# Patient Record
Sex: Female | Born: 1935 | Race: White | Hispanic: No | Smoking: Never smoker
Health system: Southern US, Community
[De-identification: ages and names within clinical notes are randomized; demographics above are authoritative.]

## PROBLEM LIST (undated history)

## (undated) DIAGNOSIS — K589 Irritable bowel syndrome without diarrhea: Secondary | ICD-10-CM

## (undated) DIAGNOSIS — I10 Essential (primary) hypertension: Secondary | ICD-10-CM

## (undated) DIAGNOSIS — N183 Chronic kidney disease, stage 3 unspecified: Secondary | ICD-10-CM

## (undated) DIAGNOSIS — M1990 Unspecified osteoarthritis, unspecified site: Secondary | ICD-10-CM

## (undated) DIAGNOSIS — E785 Hyperlipidemia, unspecified: Secondary | ICD-10-CM

## (undated) DIAGNOSIS — J449 Chronic obstructive pulmonary disease, unspecified: Secondary | ICD-10-CM

## (undated) DIAGNOSIS — G43909 Migraine, unspecified, not intractable, without status migrainosus: Secondary | ICD-10-CM

## (undated) DIAGNOSIS — C8590 Non-Hodgkin lymphoma, unspecified, unspecified site: Secondary | ICD-10-CM

## (undated) HISTORY — PX: APPENDECTOMY: SHX54

## (undated) HISTORY — PX: ABDOMINAL HYSTERECTOMY: SHX81

## (undated) HISTORY — PX: COLONOSCOPY: SHX174

---

## 2004-09-21 ENCOUNTER — Ambulatory Visit: Payer: Self-pay | Admitting: Oncology

## 2004-10-22 ENCOUNTER — Ambulatory Visit: Payer: Self-pay | Admitting: Oncology

## 2005-01-09 ENCOUNTER — Ambulatory Visit: Payer: Self-pay | Admitting: Oncology

## 2005-01-22 ENCOUNTER — Ambulatory Visit: Payer: Self-pay | Admitting: Oncology

## 2005-03-31 ENCOUNTER — Ambulatory Visit: Payer: Self-pay | Admitting: Oncology

## 2005-04-03 ENCOUNTER — Ambulatory Visit: Payer: Self-pay | Admitting: Internal Medicine

## 2005-04-03 ENCOUNTER — Ambulatory Visit: Payer: Self-pay | Admitting: Oncology

## 2005-04-21 ENCOUNTER — Ambulatory Visit: Payer: Self-pay | Admitting: Oncology

## 2005-07-16 ENCOUNTER — Ambulatory Visit: Payer: Self-pay | Admitting: Oncology

## 2005-07-22 ENCOUNTER — Ambulatory Visit: Payer: Self-pay | Admitting: Oncology

## 2005-10-14 ENCOUNTER — Ambulatory Visit: Payer: Self-pay | Admitting: Oncology

## 2005-10-22 ENCOUNTER — Ambulatory Visit: Payer: Self-pay | Admitting: Oncology

## 2005-11-07 ENCOUNTER — Ambulatory Visit: Payer: Self-pay | Admitting: Oncology

## 2006-02-17 ENCOUNTER — Ambulatory Visit: Payer: Self-pay | Admitting: Oncology

## 2006-02-19 ENCOUNTER — Ambulatory Visit: Payer: Self-pay | Admitting: Oncology

## 2006-05-14 ENCOUNTER — Ambulatory Visit: Payer: Self-pay | Admitting: Oncology

## 2006-05-22 ENCOUNTER — Ambulatory Visit: Payer: Self-pay | Admitting: Oncology

## 2006-06-09 ENCOUNTER — Ambulatory Visit: Payer: Self-pay | Admitting: Internal Medicine

## 2006-08-26 ENCOUNTER — Ambulatory Visit: Payer: Self-pay | Admitting: Oncology

## 2006-09-21 ENCOUNTER — Ambulatory Visit: Payer: Self-pay | Admitting: Oncology

## 2006-11-13 ENCOUNTER — Ambulatory Visit: Payer: Self-pay | Admitting: Oncology

## 2006-11-19 ENCOUNTER — Ambulatory Visit: Payer: Self-pay | Admitting: Oncology

## 2007-02-15 ENCOUNTER — Ambulatory Visit: Payer: Self-pay | Admitting: Oncology

## 2007-02-20 ENCOUNTER — Ambulatory Visit: Payer: Self-pay | Admitting: Oncology

## 2007-06-14 ENCOUNTER — Ambulatory Visit: Payer: Self-pay | Admitting: Internal Medicine

## 2007-07-23 ENCOUNTER — Ambulatory Visit: Payer: Self-pay | Admitting: Oncology

## 2007-08-19 ENCOUNTER — Ambulatory Visit: Payer: Self-pay | Admitting: Oncology

## 2007-08-23 ENCOUNTER — Ambulatory Visit: Payer: Self-pay | Admitting: Oncology

## 2008-01-23 ENCOUNTER — Ambulatory Visit: Payer: Self-pay | Admitting: Oncology

## 2008-02-20 ENCOUNTER — Ambulatory Visit: Payer: Self-pay | Admitting: Oncology

## 2008-03-02 ENCOUNTER — Ambulatory Visit: Payer: Self-pay | Admitting: Oncology

## 2008-03-22 ENCOUNTER — Ambulatory Visit: Payer: Self-pay | Admitting: Oncology

## 2008-06-14 ENCOUNTER — Ambulatory Visit: Payer: Self-pay | Admitting: Internal Medicine

## 2008-08-22 ENCOUNTER — Ambulatory Visit: Payer: Self-pay | Admitting: Oncology

## 2008-09-07 ENCOUNTER — Ambulatory Visit: Payer: Self-pay | Admitting: Oncology

## 2008-09-21 ENCOUNTER — Ambulatory Visit: Payer: Self-pay | Admitting: Oncology

## 2009-02-19 ENCOUNTER — Ambulatory Visit: Payer: Self-pay | Admitting: Oncology

## 2009-03-15 ENCOUNTER — Ambulatory Visit: Payer: Self-pay | Admitting: Oncology

## 2009-03-22 ENCOUNTER — Ambulatory Visit: Payer: Self-pay | Admitting: Oncology

## 2009-06-18 ENCOUNTER — Ambulatory Visit: Payer: Self-pay | Admitting: Internal Medicine

## 2009-09-21 ENCOUNTER — Ambulatory Visit: Payer: Self-pay | Admitting: Oncology

## 2009-09-26 ENCOUNTER — Ambulatory Visit: Payer: Self-pay | Admitting: Oncology

## 2009-10-22 ENCOUNTER — Ambulatory Visit: Payer: Self-pay | Admitting: Oncology

## 2010-02-14 ENCOUNTER — Emergency Department: Payer: Self-pay | Admitting: Emergency Medicine

## 2010-02-19 ENCOUNTER — Ambulatory Visit: Payer: Self-pay | Admitting: Oncology

## 2010-02-26 ENCOUNTER — Ambulatory Visit: Payer: Self-pay | Admitting: Internal Medicine

## 2010-03-13 ENCOUNTER — Ambulatory Visit: Payer: Self-pay | Admitting: Oncology

## 2010-03-22 ENCOUNTER — Ambulatory Visit: Payer: Self-pay | Admitting: Oncology

## 2010-07-12 ENCOUNTER — Ambulatory Visit: Payer: Self-pay | Admitting: Internal Medicine

## 2011-02-27 ENCOUNTER — Ambulatory Visit: Payer: Self-pay | Admitting: Oncology

## 2011-02-27 ENCOUNTER — Other Ambulatory Visit: Payer: Self-pay | Admitting: Ophthalmology

## 2011-03-07 ENCOUNTER — Ambulatory Visit: Payer: Self-pay | Admitting: Ophthalmology

## 2011-03-23 ENCOUNTER — Ambulatory Visit: Payer: Self-pay | Admitting: Oncology

## 2011-05-06 ENCOUNTER — Ambulatory Visit: Payer: Self-pay | Admitting: Ophthalmology

## 2011-05-30 ENCOUNTER — Ambulatory Visit: Payer: Self-pay | Admitting: Oncology

## 2011-06-22 ENCOUNTER — Ambulatory Visit: Payer: Self-pay | Admitting: Oncology

## 2011-07-14 ENCOUNTER — Ambulatory Visit: Payer: Self-pay | Admitting: Internal Medicine

## 2011-09-09 ENCOUNTER — Ambulatory Visit: Payer: Self-pay | Admitting: Oncology

## 2011-09-22 ENCOUNTER — Ambulatory Visit: Payer: Self-pay | Admitting: Oncology

## 2011-10-09 ENCOUNTER — Ambulatory Visit: Payer: Self-pay | Admitting: Oncology

## 2011-10-23 ENCOUNTER — Ambulatory Visit: Payer: Self-pay | Admitting: Oncology

## 2012-04-08 ENCOUNTER — Ambulatory Visit: Payer: Self-pay | Admitting: Oncology

## 2012-04-08 LAB — COMPREHENSIVE METABOLIC PANEL
Albumin: 3.8 g/dL (ref 3.4–5.0)
Anion Gap: 9 (ref 7–16)
BUN: 18 mg/dL (ref 7–18)
Bilirubin,Total: 0.5 mg/dL (ref 0.2–1.0)
Calcium, Total: 9.1 mg/dL (ref 8.5–10.1)
Chloride: 97 mmol/L — ABNORMAL LOW (ref 98–107)
Creatinine: 0.94 mg/dL (ref 0.60–1.30)
EGFR (African American): >60
EGFR (Non-African Amer.): 59 — ABNORMAL LOW
Glucose: 108 mg/dL — ABNORMAL HIGH (ref 65–99)
Potassium: 4.3 mmol/L (ref 3.5–5.1)
SGPT (ALT): 22 U/L
Total Protein: 6.9 g/dL (ref 6.4–8.2)

## 2012-04-08 LAB — CBC CANCER CENTER
Basophil #: 0.1 x10 3/mm (ref 0.0–0.1)
Basophil %: 1.1 %
Eosinophil %: 1.7 %
HGB: 13.7 g/dL (ref 12.0–16.0)
Lymphocyte #: 3.2 x10 3/mm (ref 1.0–3.6)
MCH: 31.5 pg (ref 26.0–34.0)
MCHC: 33.6 g/dL (ref 32.0–36.0)
Monocyte #: 0.5 x10 3/mm (ref 0.2–0.9)
Monocyte %: 7.1 %
Neutrophil #: 3.6 x10 3/mm (ref 1.4–6.5)
Neutrophil %: 48 %
Platelet: 281 x10 3/mm (ref 150–440)
WBC: 7.6 x10 3/mm (ref 3.6–11.0)

## 2012-04-08 LAB — LACTATE DEHYDROGENASE: LDH: 212 U/L (ref 84–246)

## 2012-04-21 ENCOUNTER — Ambulatory Visit: Payer: Self-pay | Admitting: Oncology

## 2012-05-26 ENCOUNTER — Ambulatory Visit: Payer: Self-pay | Admitting: Gastroenterology

## 2012-07-15 ENCOUNTER — Ambulatory Visit: Payer: Self-pay | Admitting: Internal Medicine

## 2012-10-12 ENCOUNTER — Ambulatory Visit: Payer: Self-pay | Admitting: Oncology

## 2012-10-12 LAB — CBC CANCER CENTER
Basophil #: 0.1 x10 3/mm (ref 0.0–0.1)
Basophil %: 1 %
Eosinophil #: 0.1 x10 3/mm (ref 0.0–0.7)
Eosinophil %: 1.4 %
HCT: 42.5 % (ref 35.0–47.0)
HGB: 14.4 g/dL (ref 12.0–16.0)
Lymphocyte #: 3.9 x10 3/mm — ABNORMAL HIGH (ref 1.0–3.6)
Lymphocyte %: 40.8 %
MCH: 31.7 pg (ref 26.0–34.0)
MCHC: 33.8 g/dL (ref 32.0–36.0)
Monocyte #: 0.7 x10 3/mm (ref 0.2–0.9)
Monocyte %: 7 %
Neutrophil #: 4.8 x10 3/mm (ref 1.4–6.5)
Neutrophil %: 49.8 %
RBC: 4.53 10*6/uL (ref 3.80–5.20)
RDW: 12.4 % (ref 11.5–14.5)
WBC: 9.7 x10 3/mm (ref 3.6–11.0)

## 2012-10-12 LAB — COMPREHENSIVE METABOLIC PANEL
Albumin: 4 g/dL (ref 3.4–5.0)
Alkaline Phosphatase: 54 U/L (ref 50–136)
Calcium, Total: 9.3 mg/dL (ref 8.5–10.1)
Co2: 28 mmol/L (ref 21–32)
Creatinine: 1.05 mg/dL (ref 0.60–1.30)
EGFR (African American): 59 — ABNORMAL LOW
EGFR (Non-African Amer.): 51 — ABNORMAL LOW
Glucose: 113 mg/dL — ABNORMAL HIGH (ref 65–99)
Potassium: 3.9 mmol/L (ref 3.5–5.1)
SGPT (ALT): 24 U/L (ref 12–78)

## 2012-10-22 ENCOUNTER — Ambulatory Visit: Payer: Self-pay | Admitting: Oncology

## 2013-04-12 ENCOUNTER — Ambulatory Visit: Payer: Self-pay | Admitting: Oncology

## 2013-04-12 LAB — COMPREHENSIVE METABOLIC PANEL
Albumin: 3.8 g/dL (ref 3.4–5.0)
Alkaline Phosphatase: 53 U/L (ref 50–136)
BUN: 17 mg/dL (ref 7–18)
Calcium, Total: 9.6 mg/dL (ref 8.5–10.1)
Chloride: 97 mmol/L — ABNORMAL LOW (ref 98–107)
Co2: 30 mmol/L (ref 21–32)
Creatinine: 1.1 mg/dL (ref 0.60–1.30)
EGFR (African American): 56 — ABNORMAL LOW
EGFR (Non-African Amer.): 48 — ABNORMAL LOW
Glucose: 143 mg/dL — ABNORMAL HIGH (ref 65–99)
Osmolality: 278 (ref 275–301)
Potassium: 4.4 mmol/L (ref 3.5–5.1)
SGOT(AST): 22 U/L (ref 15–37)
SGPT (ALT): 28 U/L (ref 12–78)
Sodium: 137 mmol/L (ref 136–145)

## 2013-04-12 LAB — CBC CANCER CENTER
Basophil #: 0 x10 3/mm (ref 0.0–0.1)
Basophil %: 0.3 %
HGB: 14.3 g/dL (ref 12.0–16.0)
Lymphocyte #: 2.5 x10 3/mm (ref 1.0–3.6)
MCH: 31.6 pg (ref 26.0–34.0)
MCHC: 34.4 g/dL (ref 32.0–36.0)
Monocyte %: 8.3 %
Platelet: 263 x10 3/mm (ref 150–440)
RBC: 4.52 10*6/uL (ref 3.80–5.20)
RDW: 12.3 % (ref 11.5–14.5)

## 2013-04-21 ENCOUNTER — Ambulatory Visit: Payer: Self-pay | Admitting: Oncology

## 2013-07-18 ENCOUNTER — Ambulatory Visit: Payer: Self-pay | Admitting: Oncology

## 2013-08-04 ENCOUNTER — Ambulatory Visit: Payer: Self-pay | Admitting: Oncology

## 2013-08-22 ENCOUNTER — Ambulatory Visit: Payer: Self-pay | Admitting: Oncology

## 2013-08-25 ENCOUNTER — Emergency Department: Payer: Self-pay | Admitting: Emergency Medicine

## 2013-08-25 LAB — URINALYSIS, COMPLETE
RBC,UR: 2593 /HPF (ref 0–5)
Squamous Epithelial: NONE SEEN
WBC UR: 512 /HPF (ref 0–5)

## 2013-08-26 ENCOUNTER — Ambulatory Visit: Payer: Self-pay | Admitting: Oncology

## 2013-09-05 ENCOUNTER — Inpatient Hospital Stay: Payer: Self-pay | Admitting: Internal Medicine

## 2013-09-05 LAB — COMPREHENSIVE METABOLIC PANEL
Anion Gap: 10 (ref 7–16)
BUN: 10 mg/dL (ref 7–18)
Bilirubin,Total: 1.1 mg/dL — ABNORMAL HIGH (ref 0.2–1.0)
Chloride: 80 mmol/L — ABNORMAL LOW (ref 98–107)
Co2: 25 mmol/L (ref 21–32)
Creatinine: 1.02 mg/dL (ref 0.60–1.30)
Glucose: 158 mg/dL — ABNORMAL HIGH (ref 65–99)
Osmolality: 235 (ref 275–301)
Potassium: 3.4 mmol/L — ABNORMAL LOW (ref 3.5–5.1)
SGOT(AST): 33 U/L (ref 15–37)
SGPT (ALT): 21 U/L (ref 12–78)
Sodium: 115 mmol/L — CL (ref 136–145)

## 2013-09-05 LAB — URINALYSIS, COMPLETE
Bacteria: NONE SEEN
Bilirubin,UR: NEGATIVE
Hyaline Cast: 2
Protein: NEGATIVE
RBC,UR: 2 /HPF (ref 0–5)
Specific Gravity: 1.005 (ref 1.003–1.030)
Squamous Epithelial: NONE SEEN

## 2013-09-05 LAB — CBC WITH DIFFERENTIAL/PLATELET
Basophil #: 0 10*3/uL (ref 0.0–0.1)
Lymphocyte #: 2.6 10*3/uL (ref 1.0–3.6)
MCV: 90 fL (ref 80–100)
Monocyte #: 0.6 x10 3/mm (ref 0.2–0.9)
Monocyte %: 6.6 %
Neutrophil #: 5.4 10*3/uL (ref 1.4–6.5)
Neutrophil %: 62.9 %
Platelet: 330 10*3/uL (ref 150–440)
RBC: 4.22 10*6/uL (ref 3.80–5.20)
WBC: 8.6 10*3/uL (ref 3.6–11.0)

## 2013-09-05 LAB — POTASSIUM: Potassium: 3.9 mmol/L (ref 3.5–5.1)

## 2013-09-05 LAB — SODIUM: Sodium: 122 mmol/L — ABNORMAL LOW (ref 136–145)

## 2013-09-06 LAB — BASIC METABOLIC PANEL
Anion Gap: 8 (ref 7–16)
BUN: 9 mg/dL (ref 7–18)
Calcium, Total: 8.7 mg/dL (ref 8.5–10.1)
Chloride: 96 mmol/L — ABNORMAL LOW (ref 98–107)
EGFR (African American): 56 — ABNORMAL LOW
EGFR (Non-African Amer.): 48 — ABNORMAL LOW
Glucose: 89 mg/dL (ref 65–99)
Osmolality: 257 (ref 275–301)

## 2013-09-06 LAB — CBC WITH DIFFERENTIAL/PLATELET
Basophil #: 0 10*3/uL (ref 0.0–0.1)
Eosinophil #: 0.1 10*3/uL (ref 0.0–0.7)
Eosinophil %: 0.8 %
HCT: 34.7 % — ABNORMAL LOW (ref 35.0–47.0)
HGB: 12.5 g/dL (ref 12.0–16.0)
Lymphocyte #: 2.8 10*3/uL (ref 1.0–3.6)
Lymphocyte %: 35.1 %
MCHC: 36 g/dL (ref 32.0–36.0)
MCV: 91 fL (ref 80–100)
Monocyte #: 0.8 x10 3/mm (ref 0.2–0.9)
Monocyte %: 10 %
Neutrophil #: 4.2 10*3/uL (ref 1.4–6.5)
Platelet: 270 10*3/uL (ref 150–440)
RDW: 12.6 % (ref 11.5–14.5)

## 2013-09-06 LAB — MAGNESIUM: Magnesium: 1.8 mg/dL

## 2013-09-06 LAB — URINE CULTURE

## 2013-09-07 LAB — BASIC METABOLIC PANEL
Anion Gap: 8 (ref 7–16)
BUN: 12 mg/dL (ref 7–18)
Chloride: 100 mmol/L (ref 98–107)
Co2: 26 mmol/L (ref 21–32)
Creatinine: 0.96 mg/dL (ref 0.60–1.30)
EGFR (African American): >60
Potassium: 3.6 mmol/L (ref 3.5–5.1)
Sodium: 134 mmol/L — ABNORMAL LOW (ref 136–145)

## 2013-09-07 LAB — CBC WITH DIFFERENTIAL/PLATELET
Basophil #: 0.1 10*3/uL (ref 0.0–0.1)
Eosinophil #: 0.1 10*3/uL (ref 0.0–0.7)
Eosinophil %: 1.2 %
HGB: 12 g/dL (ref 12.0–16.0)
Lymphocyte #: 2.5 10*3/uL (ref 1.0–3.6)
MCH: 32.3 pg (ref 26.0–34.0)
MCHC: 35.4 g/dL (ref 32.0–36.0)
MCV: 91 fL (ref 80–100)
Monocyte %: 9 %
Neutrophil #: 3.9 10*3/uL (ref 1.4–6.5)
Platelet: 258 10*3/uL (ref 150–440)
RBC: 3.71 10*6/uL — ABNORMAL LOW (ref 3.80–5.20)
RDW: 12.3 % (ref 11.5–14.5)

## 2013-11-07 ENCOUNTER — Ambulatory Visit: Payer: Self-pay | Admitting: Internal Medicine

## 2014-04-10 ENCOUNTER — Ambulatory Visit: Payer: Self-pay | Admitting: Oncology

## 2014-04-11 LAB — COMPREHENSIVE METABOLIC PANEL
ANION GAP: 4 — AB (ref 7–16)
AST: 13 U/L — AB (ref 15–37)
Albumin: 3.3 g/dL — ABNORMAL LOW (ref 3.4–5.0)
Alkaline Phosphatase: 60 U/L
BILIRUBIN TOTAL: 0.4 mg/dL (ref 0.2–1.0)
BUN: 18 mg/dL (ref 7–18)
CALCIUM: 9.3 mg/dL (ref 8.5–10.1)
Chloride: 101 mmol/L (ref 98–107)
Co2: 32 mmol/L (ref 21–32)
Creatinine: 0.95 mg/dL (ref 0.60–1.30)
EGFR (Non-African Amer.): 57 — ABNORMAL LOW
Glucose: 88 mg/dL (ref 65–99)
Osmolality: 275 (ref 275–301)
Potassium: 4.4 mmol/L (ref 3.5–5.1)
SGPT (ALT): 16 U/L (ref 12–78)
SODIUM: 137 mmol/L (ref 136–145)
TOTAL PROTEIN: 7 g/dL (ref 6.4–8.2)

## 2014-04-11 LAB — CBC CANCER CENTER
BASOS PCT: 0.2 %
Basophil #: 0 x10 3/mm (ref 0.0–0.1)
EOS ABS: 0.1 x10 3/mm (ref 0.0–0.7)
Eosinophil %: 1.2 %
HCT: 39.8 % (ref 35.0–47.0)
HGB: 13.4 g/dL (ref 12.0–16.0)
LYMPHS ABS: 3.7 x10 3/mm — AB (ref 1.0–3.6)
LYMPHS PCT: 41 %
MCH: 30.7 pg (ref 26.0–34.0)
MCHC: 33.7 g/dL (ref 32.0–36.0)
MCV: 91 fL (ref 80–100)
Monocyte #: 0.8 x10 3/mm (ref 0.2–0.9)
Monocyte %: 8.9 %
NEUTROS PCT: 48.7 %
Neutrophil #: 4.4 x10 3/mm (ref 1.4–6.5)
PLATELETS: 337 x10 3/mm (ref 150–440)
RBC: 4.36 10*6/uL (ref 3.80–5.20)
RDW: 12.3 % (ref 11.5–14.5)
WBC: 9.1 x10 3/mm (ref 3.6–11.0)

## 2014-04-11 LAB — SEDIMENTATION RATE: Erythrocyte Sed Rate: 17 mm/hr (ref 0–30)

## 2014-04-11 LAB — LACTATE DEHYDROGENASE: LDH: 126 U/L (ref 81–246)

## 2014-04-21 ENCOUNTER — Ambulatory Visit: Payer: Self-pay | Admitting: Oncology

## 2014-10-29 ENCOUNTER — Emergency Department: Payer: Self-pay | Admitting: Emergency Medicine

## 2014-10-29 LAB — BASIC METABOLIC PANEL WITH GFR
Anion Gap: 7
BUN: 14 mg/dL
Calcium, Total: 8.6 mg/dL
Chloride: 99 mmol/L
Co2: 28 mmol/L
Creatinine: 1.01 mg/dL
EGFR (African American): >60
EGFR (Non-African Amer.): 56 — ABNORMAL LOW
Glucose: 103 mg/dL — ABNORMAL HIGH
Osmolality: 269
Potassium: 4.1 mmol/L
Sodium: 134 mmol/L — ABNORMAL LOW

## 2014-10-29 LAB — CBC
HCT: 40 % (ref 35.0–47.0)
HGB: 13.4 g/dL (ref 12.0–16.0)
MCH: 31.5 pg (ref 26.0–34.0)
MCHC: 33.4 g/dL (ref 32.0–36.0)
MCV: 94 fL (ref 80–100)
PLATELETS: 292 10*3/uL (ref 150–440)
RBC: 4.25 10*6/uL (ref 3.80–5.20)
RDW: 12.5 % (ref 11.5–14.5)
WBC: 8.7 10*3/uL (ref 3.6–11.0)

## 2014-10-29 LAB — URINALYSIS, COMPLETE
Bilirubin,UR: NEGATIVE
Blood: NEGATIVE
Glucose,UR: NEGATIVE mg/dL
Ketone: NEGATIVE
Leukocyte Esterase: NEGATIVE
Nitrite: POSITIVE
Ph: 6
Protein: NEGATIVE
RBC,UR: 2 /HPF
Specific Gravity: 1.009
Squamous Epithelial: <1
WBC UR: 1 /HPF

## 2014-10-30 LAB — URINE CULTURE

## 2014-11-27 ENCOUNTER — Ambulatory Visit: Payer: Self-pay | Admitting: Oncology

## 2014-11-27 LAB — CBC CANCER CENTER
BASOS ABS: 0.1 x10 3/mm (ref 0.0–0.1)
BASOS PCT: 1 %
Eosinophil #: 0.1 x10 3/mm (ref 0.0–0.7)
Eosinophil %: 0.9 %
HCT: 39.7 % (ref 35.0–47.0)
HGB: 13.2 g/dL (ref 12.0–16.0)
Lymphocyte #: 4 x10 3/mm — ABNORMAL HIGH (ref 1.0–3.6)
Lymphocyte %: 41.8 %
MCH: 31.6 pg (ref 26.0–34.0)
MCHC: 33.2 g/dL (ref 32.0–36.0)
MCV: 95 fL (ref 80–100)
MONO ABS: 0.8 x10 3/mm (ref 0.2–0.9)
Monocyte %: 8.5 %
NEUTROS PCT: 47.8 %
Neutrophil #: 4.6 x10 3/mm (ref 1.4–6.5)
Platelet: 292 x10 3/mm (ref 150–440)
RBC: 4.17 10*6/uL (ref 3.80–5.20)
RDW: 14 % (ref 11.5–14.5)
WBC: 9.6 x10 3/mm (ref 3.6–11.0)

## 2014-11-27 LAB — BASIC METABOLIC PANEL
ANION GAP: 5 — AB (ref 7–16)
BUN: 16 mg/dL (ref 7–18)
CO2: 31 mmol/L (ref 21–32)
CREATININE: 1.12 mg/dL (ref 0.60–1.30)
Calcium, Total: 9.1 mg/dL (ref 8.5–10.1)
Chloride: 99 mmol/L (ref 98–107)
EGFR (Non-African Amer.): 50 — ABNORMAL LOW
Glucose: 96 mg/dL (ref 65–99)
Osmolality: 271 (ref 275–301)
POTASSIUM: 4 mmol/L (ref 3.5–5.1)
SODIUM: 135 mmol/L — AB (ref 136–145)

## 2014-11-27 LAB — URINALYSIS, COMPLETE
Bacteria: NONE SEEN
Bilirubin,UR: NEGATIVE
Glucose,UR: NEGATIVE mg/dL (ref 0–75)
Ketone: NEGATIVE
NITRITE: NEGATIVE
PROTEIN: NEGATIVE
Ph: 5 (ref 4.5–8.0)
SPECIFIC GRAVITY: 1.01 (ref 1.003–1.030)

## 2014-11-29 LAB — URINE CULTURE

## 2014-12-22 ENCOUNTER — Ambulatory Visit: Payer: Self-pay | Admitting: Oncology

## 2015-04-13 NOTE — H&P (Signed)
PATIENT NAME:  Sandra Delgado, Sandra Delgado MR#:  [id number] DATE OF BIRTH:  1933/06/27  DATE OF ADMISSION:  09/05/2013  PRIMARY CARE PHYSICIAN: Dr. Frazier Richards.  ONCOLOGY DOCTOR: Janak K. Choksi, MD REQUESTING PHYSICIAN: Dr. Conni Slipper.   CHIEF COMPLAINT: Poor p.o. intake, nausea, vomiting and unable to tolerate Bactrim.   HISTORY OF PRESENT ILLNESS: The patient is a 81 year old female with a known history of follicular lymphoma, stage III, clinically. Migraine, hypertension, is being admitted for severe hyponatremia. The patient was diagnosed with urinary tract infection two weeks ago and was given ciprofloxacin from the Emergency Room, was discharged home. Went to her primary care physician as she did not have significant improvement, continued to have dysuria and was changed over to Bactrim about four days ago, which actually made her significantly worse. She could not keep anything down orally. She continued to have nausea, vomiting and dry heaves, has not been able to sleep well for the last 1 or 2 weeks due to ongoing burning urination and nausea, vomiting for last four days, and decided to come to the Emergency Department as she was feeling really trembling sensation inside her body. While in the ED, she was found to have sodium of 115 and she is being admitted for further evaluation and management.   PAST MEDICAL HISTORY: 1. Follicular lymphoma, stage III, clinically.  2. Arthritis.  3. Migraine.  4. Hypertension.   ALLERGIES: No known drug allergies.   FAMILY HISTORY: Mother with colon carcinoma and cancer of the uterus also runs in the family.   SOCIAL HISTORY: The patient is a nonsmoker. Occasional alcohol. She is retired, used to work in [employer] under[employer].   PAST SURGICAL HISTORY: 1. Hysterectomy in 1974.  2. Appendectomy, 1954.   MEDICATIONS AT HOME: 1. Chlorthalidone 25 mg p.o. 2 times a week.  2. Dicyclomine 10 mg p.o. three  times a day as needed.  3. Omeprazole 40 mg p.o.  daily.  4. Pravastatin  40 mg p.o. at bedtime.  5. Bactrim 1 tablet p.o. b.i.d. for seven days, prescribed  4 days ago.   REVIEW OF SYSTEMS: CONSTITUTIONAL: No fever. Positive for fatigue and weakness.  EYES: No blurred or double vision.  ENT: Decreased hearing. No tinnitus or ear pain.  RESPIRATORY: No cough, wheezing, hemoptysis.  CARDIOVASCULAR: No chest pain, orthopnea, edema.  GASTROINTESTINAL: Positive for nausea, vomiting and dry heaves. No abdominal pain.  GENITOURINARY: Positive for dysuria and no hematuria. Recently finished few days of Cipro followed by Bactrim, which was started  four days ago.  ENDOCRINE: No polyuria or nocturia.  HEMATOLOGY: No anemia or easy bruising.  SKIN: No rash or lesion.  MUSCULOSKELETAL: Positive for arthritis, no muscle cramp.  NEUROLOGIC: No tingling, numbness, weakness.  PSYCHIATRIC: No history of anxiety or depression.   PHYSICAL EXAMINATION: VITAL SIGNS: Temperature 98.8, heart rate 70, pulmonary respirations 20 per minute, blood pressure 134/65 mm hg, she is saturating 94% on room air. GENERAL: The patient  is a 81 year old female lying in the bed comfortably without any acute distress.  EYES: Pupils equal, round, reactive to light and accommodation. No scleral icterus. Extraocular muscles intact.  HEENT: Head atraumatic, normocephalic. Oropharynx and nasopharynx clear.  NECK: Supple. No jugular venous distention. No thyroid enlargement or tenderness.  LUNGS: Clear to auscultation bilaterally. No wheezing, rales, rhonchi or crepitation.  CARDIOVASCULAR: S1, S2 normal. No murmur, rales or gallop ABDOMEN: Soft, nontender, nondistended. Bowel sounds present. No organomegaly or masses.  EXTREMITIES: No pitting edema, cyanosis or clubbing.  NEUROLOGIC: Nonfocal examination. Cranial nerves II through XII intact. Muscle strength 5/5 in all extremities. Sensation intact.  PSYCHIATRIC: The patient is alert and oriented x3.  SKIN: No obvious rash,  lesion or ulcer.   LABORATORY PANEL: Normal CBC. Negative urinalysis. Normal liver function tests. BMP showed sodium of 115, potassium of 3.4, chloride 80, BUN 10, creatinine 1.02, blood sugar 158.   EKG has not been obtained. Chest x-ray is pending.   IMPRESSION AND PLAN: 1. Severe hyponatremia, likely due to dehydration from ongoing nausea, vomiting and intolerance to Bactrim. We will stop chlorthalidone at this point and hydrate her aggressively with IV fluids and monitor her sodium very closely. We will monitor neurochecks every four hour due to her severe hyponatremia. This could be fatal if not monitored very closely. We will put her on telemetry.  2. Ongoing nausea, vomiting and poor p.o. intake, likely secondary to intolerance to Bactrim. We will stop Bactrim. Her urine looks very clear at this point. We will hold off any antibiotics for now.  3. Hypokalemia. We will replete and recheck. Also check her magnesium.  4. Hypertension. Her blood pressure seems fairly stable at this point. We will monitor. Hold off chlorthalidone at this time.  5. CODE STATUS: FULL CODE.   Total time taking care of this patient is 45 minutes.     ____________________________ Lucina Mellow. Manuella Ghazi, MD vss:sg D: 09/05/2013 10:45:00 ET T: 09/05/2013 11:59:54 ET JOB#: [id number]  cc: Ocie Cornfield. Ouida Sills, MD Martie Lee. Choksi, MD Judaea Burgoon S. Manuella Ghazi, MD, <Dictator>   Lucina Mellow [facility] MD ELECTRONICALLY SIGNED 09/06/2013 14:49

## 2015-04-13 NOTE — Discharge Summary (Signed)
PATIENT NAME:  Sandra Delgado, Sandra Delgado MR#:  [id number] DATE OF BIRTH:  07-24-1933  DATE OF ADMISSION:  09/05/2013 DATE OF PLANNED DISCHARGE:  09/07/2013   DISCHARGE DIAGNOSES:  1. Hyponatremia.  2. Dehydration, causing above.  3. Nausea and vomiting, likely from sulfa medication intolerance, causing above.  4. Anxiety, being discharged on Celexa.  5. Irritable bowel, stable other than the nausea and vomiting caused by sulfa.   DISCHARGE MEDICATIONS: Per [facility] med reconciliation. She will not take her chlorthalidone at this point given the hyponatremia either. Will see her soon to follow up her blood pressure in the office.   HISTORY AND PHYSICAL: Please see detailed history and physical done on admission.   HOSPITAL COURSE: The patient was admitted with the above. Nausea and vomiting abated quickly with discontinuation of the Septra. She was given IV fluids consisting of normal saline. Sodium came up nicely from 115 on admission to 122 on the 15th, 129 on the 16th and 134 on today's date. She was tolerating her diet well without any trouble, so will discharge her home. Chest x-ray showed mild hyperinflation only. Notably, she has a history lymphoma. There was not thought to be any recurrence of that at this point, at least on her chest x-ray, and she does follow up with Dr. Oliva Bustard, her oncologist.   TIME SPENT: It took approximately 35 minutes to do all the discharge tasks.   ____________________________ Ocie Cornfield. Ouida Sills, MD mwa:OSi D: 09/07/2013 08:04:44 ET T: 09/07/2013 08:33:21 ET JOB#: [id number]  cc: Ocie Cornfield. Ouida Sills, MD, <Dictator> Kirk Ruths MD ELECTRONICALLY SIGNED 09/07/2013 13:48

## 2016-02-05 ENCOUNTER — Observation Stay
Admission: EM | Admit: 2016-02-05 | Discharge: 2016-02-07 | Disposition: A | Discharge status: Home / Self Care | Payer: Medicare Other | Attending: Internal Medicine | Admitting: Internal Medicine

## 2016-02-05 ENCOUNTER — Emergency Department: Payer: Medicare Other

## 2016-02-05 DIAGNOSIS — R079 Chest pain, unspecified: Secondary | ICD-10-CM | POA: Insufficient documentation

## 2016-02-05 DIAGNOSIS — Z7722 Contact with and (suspected) exposure to environmental tobacco smoke (acute) (chronic): Secondary | ICD-10-CM | POA: Insufficient documentation

## 2016-02-05 DIAGNOSIS — R05 Cough: Secondary | ICD-10-CM | POA: Insufficient documentation

## 2016-02-05 DIAGNOSIS — E785 Hyperlipidemia, unspecified: Secondary | ICD-10-CM | POA: Insufficient documentation

## 2016-02-05 DIAGNOSIS — E871 Hypo-osmolality and hyponatremia: Secondary | ICD-10-CM | POA: Diagnosis not present

## 2016-02-05 DIAGNOSIS — Z8572 Personal history of non-Hodgkin lymphomas: Secondary | ICD-10-CM | POA: Insufficient documentation

## 2016-02-05 DIAGNOSIS — Z800 Family history of malignant neoplasm of digestive organs: Secondary | ICD-10-CM | POA: Diagnosis not present

## 2016-02-05 DIAGNOSIS — Z79899 Other long term (current) drug therapy: Secondary | ICD-10-CM | POA: Insufficient documentation

## 2016-02-05 DIAGNOSIS — J40 Bronchitis, not specified as acute or chronic: Secondary | ICD-10-CM

## 2016-02-05 DIAGNOSIS — K219 Gastro-esophageal reflux disease without esophagitis: Secondary | ICD-10-CM | POA: Diagnosis not present

## 2016-02-05 DIAGNOSIS — Z880 Allergy status to penicillin: Secondary | ICD-10-CM | POA: Diagnosis not present

## 2016-02-05 DIAGNOSIS — Z7982 Long term (current) use of aspirin: Secondary | ICD-10-CM | POA: Insufficient documentation

## 2016-02-05 DIAGNOSIS — Z90710 Acquired absence of both cervix and uterus: Secondary | ICD-10-CM | POA: Insufficient documentation

## 2016-02-05 DIAGNOSIS — I10 Essential (primary) hypertension: Secondary | ICD-10-CM | POA: Insufficient documentation

## 2016-02-05 DIAGNOSIS — M1991 Primary osteoarthritis, unspecified site: Secondary | ICD-10-CM | POA: Insufficient documentation

## 2016-02-05 DIAGNOSIS — K589 Irritable bowel syndrome without diarrhea: Secondary | ICD-10-CM | POA: Insufficient documentation

## 2016-02-05 DIAGNOSIS — J441 Chronic obstructive pulmonary disease with (acute) exacerbation: Secondary | ICD-10-CM | POA: Diagnosis not present

## 2016-02-05 DIAGNOSIS — Z9049 Acquired absence of other specified parts of digestive tract: Secondary | ICD-10-CM | POA: Insufficient documentation

## 2016-02-05 DIAGNOSIS — Z888 Allergy status to other drugs, medicaments and biological substances status: Secondary | ICD-10-CM | POA: Insufficient documentation

## 2016-02-05 HISTORY — DX: Chronic obstructive pulmonary disease, unspecified: J44.9

## 2016-02-05 HISTORY — DX: Hyperlipidemia, unspecified: E78.5

## 2016-02-05 HISTORY — DX: Essential (primary) hypertension: I10

## 2016-02-05 HISTORY — DX: Migraine, unspecified, not intractable, without status migrainosus: G43.909

## 2016-02-05 HISTORY — DX: Unspecified osteoarthritis, unspecified site: M19.90

## 2016-02-05 HISTORY — DX: Irritable bowel syndrome, unspecified: K58.9

## 2016-02-05 HISTORY — DX: Non-Hodgkin lymphoma, unspecified, unspecified site: C85.90

## 2016-02-05 LAB — CBC
HCT: 37.8 % (ref 35.0–47.0)
Hemoglobin: 13.3 g/dL (ref 12.0–16.0)
MCH: 31.5 pg (ref 26.0–34.0)
MCHC: 35.1 g/dL (ref 32.0–36.0)
MCV: 89.6 fL (ref 80.0–100.0)
PLATELETS: 293 10*3/uL (ref 150–440)
RBC: 4.22 MIL/uL (ref 3.80–5.20)
RDW: 12.3 % (ref 11.5–14.5)
WBC: 10 10*3/uL (ref 3.6–11.0)

## 2016-02-05 MED ORDER — IPRATROPIUM-ALBUTEROL 0.5-2.5 (3) MG/3ML IN SOLN
3.0000 mL | Freq: Once | RESPIRATORY_TRACT | Status: AC
  Administered 2016-02-05: 3 mL via RESPIRATORY_TRACT
  Filled 2016-02-05: qty 3

## 2016-02-05 MED ORDER — METHYLPREDNISOLONE SODIUM SUCC 125 MG IJ SOLR
125.0000 mg | Freq: Once | INTRAMUSCULAR | Status: AC
  Administered 2016-02-05: 125 mg via INTRAVENOUS
  Filled 2016-02-05: qty 2

## 2016-02-05 NOTE — ED Notes (Signed)
Pt in with co shob since last week, co chest pain, and cough.

## 2016-02-05 NOTE — ED Provider Notes (Signed)
[facility] Emergency Department Provider Note  ____________________________________________  Time seen: Approximately 11:10 PM  I have reviewed the triage vital signs and the nursing notes.   HISTORY  Chief Complaint Shortness of Breath    HPI Sandra Delgado is a 82 y.o. female who comes into the hospital today with some congestion and shortness of breath and wheezing. The patient reports that the symptoms started last week. She reports that it has gradually gotten worse. The patient saw her doctor last Friday and was placed on ampicillin. She reports that she had severe diarrhea so than she were placed on Levaquin. The patient reports that she has still been wheezing and asked for an inhaler. The patient was given an inhaler but she reports it was not helping today. She still wheezing and was concerned due to her congestion so she decided to come in to get checked out. The patient denies any sick contacts or fevers. She does have some chest tightness but no distinct pain. The patient reports that the wheezing stays in the congestion seems to be worse. The patient reports that she just wants to get better and take care of this wheezing.    Past Medical History  Diagnosis Date  . Arthritis   . Migraine   . HTN (hypertension)   . IBS (irritable bowel syndrome)   . HLD (hyperlipidemia)   . Lymphoma ([place])   . COPD (chronic obstructive pulmonary disease) [facility])     Patient Active Problem List   Diagnosis Date Noted  . COPD exacerbation ([place]) 02/06/2016  . HLD (hyperlipidemia) 02/06/2016  . HTN (hypertension) 02/06/2016  . GERD (gastroesophageal reflux disease) 02/06/2016    Past Surgical History  Procedure Laterality Date  . Abdominal hysterectomy    . Appendectomy    . Colonoscopy      No current outpatient prescriptions on file.  Allergies Ace inhibitors; Amoxicillin; Atorvastatin; Ezetimibe; Niacin and related; and Omeprazole  Family History   Problem Relation Age of Onset  . Colon cancer Mother     Social History Social History  Substance Use Topics  . Smoking status: Never Smoker   . Smokeless tobacco: Not on file  . Alcohol Use: 0.0 oz/week    0 Standard drinks or equivalent per week     Comment: occassional    Review of Systems Constitutional: No fever/chills Eyes: No visual changes. ENT: No sore throat. Cardiovascular: chest tightness. Respiratory: Cough and shortness of breath. Gastrointestinal: No abdominal pain.  No nausea, no vomiting.  No diarrhea.  No constipation. Genitourinary: Negative for dysuria. Musculoskeletal: Negative for back pain. Skin: Negative for rash. Neurological: Negative for headaches, focal weakness or numbness.  10-point ROS otherwise negative.  ____________________________________________   PHYSICAL EXAM:  VITAL SIGNS: ED Triage Vitals  Enc Vitals Group     BP 02/05/16 2222 166/75 mmHg     Pulse Rate 02/05/16 2222 70     Resp 02/05/16 2222 20     Temp 02/05/16 2222 98.1 F (36.7 C)     Temp Source 02/05/16 2222 Oral     SpO2 02/05/16 2222 94 %     Weight 02/05/16 2222 140 lb (63.504 kg)     Height 02/05/16 2222 5\' 1"  (1.549 m)     Head Cir --      Peak Flow --      Pain Score --      Pain Loc --      Pain Edu? --  Excl. in [place]? --     Constitutional: Alert and oriented. Well appearing and in moderate distress. Eyes: Conjunctivae are normal. PERRL. EOMI. Head: Atraumatic. Nose: congestion. Mouth/Throat: Mucous membranes are moist.  Oropharynx non-erythematous. Cardiovascular: Normal rate, regular rhythm. Grossly normal heart sounds.  Good peripheral circulation. Respiratory: Increased respiratory effort.  No retractions. Diffuse expiratory wheezing throughout all lung fields. Gastrointestinal: Soft and nontender. No distention. Positive bowel sounds Musculoskeletal: No lower extremity tenderness nor edema.   Neurologic:  Normal speech and language.  Skin:   Skin is warm, dry and intact.  Psychiatric: Mood and affect are normal.   ____________________________________________   LABS (all labs ordered are listed, but only abnormal results are displayed)  Labs Reviewed  COMPREHENSIVE METABOLIC PANEL - Abnormal; Notable for the following:    Sodium 129 (*)    Chloride 97 (*)    Glucose, Bld 130 (*)    GFR calc non Af Amer 54 (*)    All other components within normal limits  TROPONIN I  CBC  TROPONIN I   ____________________________________________  EKG  ED ECG REPORT I, Loney Hering, the attending physician, personally viewed and interpreted this ECG.   Date: 02/05/2016  EKG Time: 2226  Rate: 73  Rhythm: normal sinus rhythm  Axis: normal  Intervals:left bundle branch block  ST&T Change: none  ____________________________________________  RADIOLOGY  Chest x-ray: Probable COPD without superimposed acute cardiopulmonary process. ____________________________________________   PROCEDURES  Procedure(s) performed: None  Critical Care performed: No  ____________________________________________   INITIAL IMPRESSION / ASSESSMENT AND PLAN / ED COURSE  Pertinent labs & imaging results that were available during my care of the patient were reviewed by me and considered in my medical decision making (see chart for details).  This is an 82 year old female who comes into the hospital with some shortness of breath and wheezing with some cough and congestion. The patient reports that she's been coughing for the past week and she is on Levaquin. I will do some blood work as well as a chest x-ray and reassess the patient. She will get a breathing treatment of DuoNeb as well as some Solu-Medrol.  The patient continued to have some wheezing on evaluation. I did give her 2 more DuoNeb treatments and she continued to have chest tightness and wheezing. The patient's repeat troponin was unremarkable. I decided to give the patient some  magnesium sulfate and admit her to the hospitalist service. She will receive another dose of albuterol. The patient be admitted for evaluation. She received some Levaquin at home and I will not give her any further doses here in the emergency department. ____________________________________________   FINAL CLINICAL IMPRESSION(S) / ED DIAGNOSES  Final diagnoses:  Bronchitis      Loney Hering, MD 02/06/16 [phone]

## 2016-02-06 ENCOUNTER — Encounter: Payer: Self-pay | Admitting: Internal Medicine

## 2016-02-06 DIAGNOSIS — J441 Chronic obstructive pulmonary disease with (acute) exacerbation: Secondary | ICD-10-CM | POA: Diagnosis not present

## 2016-02-06 DIAGNOSIS — I10 Essential (primary) hypertension: Secondary | ICD-10-CM | POA: Diagnosis present

## 2016-02-06 DIAGNOSIS — E785 Hyperlipidemia, unspecified: Secondary | ICD-10-CM | POA: Diagnosis present

## 2016-02-06 DIAGNOSIS — K219 Gastro-esophageal reflux disease without esophagitis: Secondary | ICD-10-CM | POA: Diagnosis present

## 2016-02-06 LAB — COMPREHENSIVE METABOLIC PANEL
ALT: 15 U/L (ref 14–54)
ANION GAP: 8 (ref 5–15)
AST: 22 U/L (ref 15–41)
Albumin: 3.7 g/dL (ref 3.5–5.0)
Alkaline Phosphatase: 45 U/L (ref 38–126)
BILIRUBIN TOTAL: 0.8 mg/dL (ref 0.3–1.2)
BUN: 19 mg/dL (ref 6–20)
CALCIUM: 8.9 mg/dL (ref 8.9–10.3)
CHLORIDE: 97 mmol/L — AB (ref 101–111)
CO2: 24 mmol/L (ref 22–32)
Creatinine, Ser: 0.97 mg/dL (ref 0.44–1.00)
GFR, EST NON AFRICAN AMERICAN: 54 mL/min — AB (ref >60)
Glucose, Bld: 130 mg/dL — ABNORMAL HIGH (ref 65–99)
POTASSIUM: 4 mmol/L (ref 3.5–5.1)
Sodium: 129 mmol/L — ABNORMAL LOW (ref 135–145)
Total Protein: 6.6 g/dL (ref 6.5–8.1)

## 2016-02-06 LAB — TROPONIN I
TROPONIN I: 0.03 ng/mL (ref <0.031)
Troponin I: 0.03 ng/mL (ref <0.031)

## 2016-02-06 LAB — BASIC METABOLIC PANEL
ANION GAP: 11 (ref 5–15)
BUN: 19 mg/dL (ref 6–20)
CALCIUM: 9.4 mg/dL (ref 8.9–10.3)
CHLORIDE: 99 mmol/L — AB (ref 101–111)
CO2: 24 mmol/L (ref 22–32)
CREATININE: 0.88 mg/dL (ref 0.44–1.00)
GLUCOSE: 148 mg/dL — AB (ref 65–99)
Potassium: 4 mmol/L (ref 3.5–5.1)
Sodium: 134 mmol/L — ABNORMAL LOW (ref 135–145)

## 2016-02-06 MED ORDER — PRAVASTATIN SODIUM 40 MG PO TABS
40.0000 mg | ORAL_TABLET | Freq: Every day | ORAL | Status: DC
  Administered 2016-02-06 – 2016-02-07 (×2): 40 mg via ORAL
  Filled 2016-02-06 (×2): qty 1

## 2016-02-06 MED ORDER — ACETAMINOPHEN 650 MG RE SUPP: 650.0000 mg | Freq: Four times a day (QID) | RECTAL | Status: DC | PRN

## 2016-02-06 MED ORDER — IPRATROPIUM-ALBUTEROL 0.5-2.5 (3) MG/3ML IN SOLN
3.0000 mL | Freq: Once | RESPIRATORY_TRACT | Status: AC
  Administered 2016-02-06: 3 mL via RESPIRATORY_TRACT
  Filled 2016-02-06: qty 3

## 2016-02-06 MED ORDER — METHYLPREDNISOLONE SODIUM SUCC 125 MG IJ SOLR
60.0000 mg | Freq: Four times a day (QID) | INTRAMUSCULAR | Status: DC
  Administered 2016-02-06 – 2016-02-07 (×5): 60 mg via INTRAVENOUS
  Filled 2016-02-06 (×5): qty 2

## 2016-02-06 MED ORDER — CARVEDILOL 6.25 MG PO TABS
12.5000 mg | ORAL_TABLET | Freq: Two times a day (BID) | ORAL | Status: DC
  Administered 2016-02-06 – 2016-02-07 (×3): 12.5 mg via ORAL
  Filled 2016-02-06 (×3): qty 2

## 2016-02-06 MED ORDER — SODIUM CHLORIDE 0.9% FLUSH
3.0000 mL | Freq: Two times a day (BID) | INTRAVENOUS | Status: DC
  Administered 2016-02-06 (×2): 3 mL via INTRAVENOUS

## 2016-02-06 MED ORDER — IPRATROPIUM-ALBUTEROL 0.5-2.5 (3) MG/3ML IN SOLN
3.0000 mL | RESPIRATORY_TRACT | Status: DC | PRN
Start: 2016-02-06 — End: 2016-02-07
  Administered 2016-02-06 – 2016-02-07 (×3): 3 mL via RESPIRATORY_TRACT
  Filled 2016-02-06 (×3): qty 3

## 2016-02-06 MED ORDER — LORAZEPAM 2 MG/ML IJ SOLN
0.5000 mg | Freq: Every evening | INTRAMUSCULAR | Status: DC | PRN
  Administered 2016-02-06: 21:00:00 0.5 mg via INTRAVENOUS
  Filled 2016-02-06: qty 1

## 2016-02-06 MED ORDER — MAGNESIUM SULFATE 2 GM/50ML IV SOLN
2.0000 g | Freq: Once | INTRAVENOUS | Status: AC
  Administered 2016-02-06: 2 g via INTRAVENOUS
  Filled 2016-02-06: qty 50

## 2016-02-06 MED ORDER — ALBUTEROL SULFATE (2.5 MG/3ML) 0.083% IN NEBU
2.5000 mg | INHALATION_SOLUTION | Freq: Once | RESPIRATORY_TRACT | Status: AC
  Administered 2016-02-06: 2.5 mg via RESPIRATORY_TRACT
  Filled 2016-02-06: qty 3

## 2016-02-06 MED ORDER — PANTOPRAZOLE SODIUM 40 MG PO TBEC
40.0000 mg | DELAYED_RELEASE_TABLET | Freq: Every day | ORAL | Status: DC
  Administered 2016-02-06 – 2016-02-07 (×2): 40 mg via ORAL
  Filled 2016-02-06 (×2): qty 1

## 2016-02-06 MED ORDER — ASPIRIN EC 81 MG PO TBEC
81.0000 mg | DELAYED_RELEASE_TABLET | Freq: Every day | ORAL | Status: DC
  Administered 2016-02-06 – 2016-02-07 (×2): 81 mg via ORAL
  Filled 2016-02-06 (×2): qty 1

## 2016-02-06 MED ORDER — ONDANSETRON HCL 4 MG/2ML IJ SOLN: 4.0000 mg | Freq: Four times a day (QID) | INTRAMUSCULAR | Status: DC | PRN

## 2016-02-06 MED ORDER — LEVOFLOXACIN 250 MG PO TABS: 250.0000 mg | ORAL_TABLET | Freq: Every day | ORAL | Status: DC

## 2016-02-06 MED ORDER — LEVOFLOXACIN 750 MG PO TABS
750.0000 mg | ORAL_TABLET | Freq: Every day | ORAL | Status: DC
  Administered 2016-02-06: 05:00:00 750 mg via ORAL
  Filled 2016-02-06: qty 1

## 2016-02-06 MED ORDER — ACETAMINOPHEN 325 MG PO TABS
650.0000 mg | ORAL_TABLET | Freq: Four times a day (QID) | ORAL | Status: DC | PRN
  Administered 2016-02-07: 05:00:00 650 mg via ORAL
  Filled 2016-02-06: qty 2

## 2016-02-06 MED ORDER — ONDANSETRON HCL 4 MG PO TABS: 4.0000 mg | ORAL_TABLET | Freq: Four times a day (QID) | ORAL | Status: DC | PRN

## 2016-02-06 MED ORDER — ENOXAPARIN SODIUM 40 MG/0.4ML ~~LOC~~ SOLN
40.0000 mg | Freq: Every day | SUBCUTANEOUS | Status: DC
  Administered 2016-02-06: 05:00:00 40 mg via SUBCUTANEOUS
  Filled 2016-02-06: qty 0.4

## 2016-02-06 MED ORDER — LOSARTAN POTASSIUM 50 MG PO TABS
100.0000 mg | ORAL_TABLET | Freq: Every day | ORAL | Status: DC
  Administered 2016-02-06 – 2016-02-07 (×2): 100 mg via ORAL
  Filled 2016-02-06 (×2): qty 2

## 2016-02-06 NOTE — Care Management (Addendum)
Admitted to [facility] with the diagnosis of COPD. Live alone. Son is Lanny Hurst [place] [phone]). Last seen Dr. Tonette Bihari PA Friday of last week. No home health. No skilled facility. No home oxygen. Uses no aids for ambulation. Takes care of both basic and instrumental activities of daily living herself, drives. Family/friend will transport. Shelbie Ammons RN MSN CCM Care Management [phone]

## 2016-02-06 NOTE — Care Management Obs Status (Signed)
[place] NOTIFICATION   Patient Details  Name: NOAH RINIER MRN: [id number] Date of Birth: 1933-03-17   Medicare Observation Status Notification Given:  Yes    Shelbie Ammons, RN 02/06/2016, 11:32 AM

## 2016-02-06 NOTE — H&P (Signed)
[place] [facility] [place] NAME: Sandra Delgado    MR#:  [id number]  DATE OF BIRTH:  [birth year]  DATE OF ADMISSION:  02/05/2016  PRIMARY CARE PHYSICIAN: No primary care provider on file.   REQUESTING/REFERRING PHYSICIAN: Dahlia Client, M.D.  CHIEF COMPLAINT:   Chief Complaint  Patient presents with  . Shortness of Breath    HISTORY OF PRESENT ILLNESS:  Sandra Delgado  is a 82 y.o. female who presents with progressive of shortness of breath. Patient states that she has been feeling progressively worse over the past 4-5 days. She went to her outpatient physician and was given Levaquin and an albuterol inhaler, but states that this did not improve her symptoms. She progressed to the point that she came to the ED for evaluation tonight. Here chest x-ray suggestive of COPD, and patient is actively wheezing on physical exam. She has no personal smoking history, but has a long-standing history of secondhand smoke exposure as her husband smoked for several decades in their home. Hospitals were called for admission for COPD exacerbation.  PAST MEDICAL HISTORY:   Past Medical History  Diagnosis Date  . Arthritis   . Migraine   . HTN (hypertension)   . IBS (irritable bowel syndrome)   . HLD (hyperlipidemia)   . Lymphoma ([place])   . COPD (chronic obstructive pulmonary disease) ([place])     PAST SURGICAL HISTORY:   Past Surgical History  Procedure Laterality Date  . Abdominal hysterectomy    . Appendectomy    . Colonoscopy      SOCIAL HISTORY:   Social History  Substance Use Topics  . Smoking status: Never Smoker   . Smokeless tobacco: Not on file  . Alcohol Use: 0.0 oz/week    0 Standard drinks or equivalent per week     Comment: occassional    FAMILY HISTORY:   Family History  Problem Relation Age of Onset  . Colon cancer Mother     DRUG ALLERGIES:   Allergies  Allergen Reactions  . Ace Inhibitors   . Amoxicillin   . Atorvastatin    . Ezetimibe   . Niacin And Related   . Omeprazole     MEDICATIONS AT HOME:   Prior to Admission medications   Medication Sig Start Date End Date Taking? Authorizing Provider  aspirin 81 MG tablet Take 81 mg by mouth daily.   Yes Historical Provider, MD  carvedilol (COREG) 12.5 MG tablet Take 12.5 mg by mouth 2 (two) times daily with a meal.   Yes Historical Provider, MD  losartan (COZAAR) 100 MG tablet Take 100 mg by mouth daily.   Yes Historical Provider, MD  omeprazole (PRILOSEC) 40 MG capsule Take 40 mg by mouth daily.   Yes Historical Provider, MD  pravastatin (PRAVACHOL) 40 MG tablet Take 40 mg by mouth daily.   Yes Historical Provider, MD    REVIEW OF SYSTEMS:  Review of Systems  Constitutional: Negative for fever, chills, weight loss and malaise/fatigue.  HENT: Negative for ear pain, hearing loss and tinnitus.   Eyes: Negative for blurred vision, double vision, pain and redness.  Respiratory: Positive for cough, shortness of breath and wheezing. Negative for hemoptysis.   Cardiovascular: Negative for chest pain, palpitations, orthopnea and leg swelling.  Gastrointestinal: Negative for nausea, vomiting, abdominal pain, diarrhea and constipation.  Genitourinary: Negative for dysuria, frequency and hematuria.  Musculoskeletal: Negative for back pain, joint pain and neck pain.  Skin:  No acne, rash, or lesions  Neurological: Negative for dizziness, tremors, focal weakness and weakness.  Endo/Heme/Allergies: Negative for polydipsia. Does not bruise/bleed easily.  Psychiatric/Behavioral: Negative for depression. The patient is not nervous/anxious and does not have insomnia.      VITAL SIGNS:   Filed Vitals:   02/05/16 2222 02/05/16 2300 02/06/16 0100  BP: 166/75 96/75 138/61  Pulse: 70 66 59  Temp: 98.1 F (36.7 C)    TempSrc: Oral    Resp: 20    Height: 5\' 1"  (1.549 m)    Weight: 63.504 kg (140 lb)    SpO2: 94% 95% 96%   Wt Readings from Last 3 Encounters:   02/05/16 63.504 kg (140 lb)    PHYSICAL EXAMINATION:  Physical Exam  Vitals reviewed. Constitutional: She is oriented to person, place, and time. She appears well-developed and well-nourished. No distress.  HENT:  Head: Normocephalic and atraumatic.  Mouth/Throat: Oropharynx is clear and moist.  Eyes: Conjunctivae and EOM are normal. Pupils are equal, round, and reactive to light. No scleral icterus.  Neck: Normal range of motion. Neck supple. No JVD present. No thyromegaly present.  Cardiovascular: Normal rate, regular rhythm and intact distal pulses.  Exam reveals no gallop and no friction rub.   No murmur heard. Respiratory: She is in respiratory distress (mild). She has wheezes. She has no rales.  GI: Soft. Bowel sounds are normal. She exhibits no distension. There is no tenderness.  Musculoskeletal: Normal range of motion. She exhibits no edema.  No arthritis, no gout  Lymphadenopathy:    She has no cervical adenopathy.  Neurological: She is alert and oriented to person, place, and time. No cranial nerve deficit.  No dysarthria, no aphasia  Skin: Skin is warm and dry. No rash noted. No erythema.  Psychiatric: She has a normal mood and affect. Her behavior is normal. Judgment and thought content normal.    LABORATORY PANEL:   CBC  Recent Labs Lab 02/05/16 2318  WBC 10.0  HGB 13.3  HCT 37.8  PLT 293   ------------------------------------------------------------------------------------------------------------------  Chemistries   Recent Labs Lab 02/05/16 2318  NA 129*  K 4.0  CL 97*  CO2 24  GLUCOSE 130*  BUN 19  CREATININE 0.97  CALCIUM 8.9  AST 22  ALT 15  ALKPHOS 45  BILITOT 0.8   ------------------------------------------------------------------------------------------------------------------  Cardiac Enzymes  Recent Labs Lab 02/06/16 0158  TROPONINI 0.03    ------------------------------------------------------------------------------------------------------------------  RADIOLOGY:  Dg Chest 2 View  02/06/2016  CLINICAL DATA:  Shortness of breath beginning last week, chest pain and cough which is worse tonight. Not improving on antibiotics. EXAM: CHEST  2 VIEW COMPARISON:  Chest radiograph September 05, 2013 FINDINGS: Cardiomediastinal silhouette is normal, mildly calcified aortic knob. Mildly increased lung volumes with flattened hemidiaphragms. No pleural effusion or focal consolidation. No pneumothorax. Soft tissue planes and included osseous structure normal. IMPRESSION: Probable COPD without superimposed acute cardiopulmonary process. Electronically Signed   By: Elon Alas M.D.   On: 02/06/2016 00:04    EKG:   Orders placed or performed during the hospital encounter of 02/05/16  . EKG 12-Lead  . EKG 12-Lead    IMPRESSION AND PLAN:  Principal Problem:   COPD exacerbation ([place]) - given nebulizers, IV steroids, magnesium in the ED. Continue nebs and IV steroids as well as Levaquin. Active Problems:   HTN (hypertension) - continue home meds   HLD (hyperlipidemia) - continue home meds   GERD (gastroesophageal reflux disease) - home dose PPI  All  the records are reviewed and case discussed with ED provider. Management plans discussed with the patient and/or family.  DVT PROPHYLAXIS: SubQ lovenox  GI PROPHYLAXIS: PPI  ADMISSION STATUS: Observation  CODE STATUS: Full Code Status History    This patient does not have a recorded code status. Please follow your organizational policy for patients in this situation.      TOTAL TIME TAKING CARE OF THIS PATIENT: 45 minutes.    Lucila Klecka [place] 02/06/2016, 3:39 AM  [facility]  Office  [phone]  CC: Primary care physician; No primary care provider on file.

## 2016-02-06 NOTE — Progress Notes (Signed)
Antibiotic Renal Adjustment per Protocol- Levofloxacin  82 yo F currently ordered Levofloxacin 750 mg PO Q24h.  Lab Results  Component Value Date   CREATININE 0.97 02/05/2016   Estimated Creatinine Clearance: 39.7 mL/min (by C-G formula based on Cr of 0.97).    Lab Results  Component Value Date   WBC 10.0 02/05/2016   Temp Readings from Last 3 Encounters:  02/06/16 97.9 F (36.6 C) Oral    Based on current Renal function, Will adjust Levofloxacin to 250 po Q24h.  Chinita [place] PharmD Clinical Pharmacist 02/06/2016 10:27 AM

## 2016-02-06 NOTE — ED Notes (Signed)
Report from [place], [place]

## 2016-02-06 NOTE — ED Notes (Signed)
Admitting MD at bedside.

## 2016-02-06 NOTE — Progress Notes (Addendum)
[facility] NAME: Sandra Delgado    MR#:  [id number]  DATE OF BIRTH:  1933/07/24  SUBJECTIVE:  CHIEF COMPLAINT:   Chief Complaint  Patient presents with  . Shortness of Breath   Still cough, SOB and wheezing. REVIEW OF SYSTEMS:  CONSTITUTIONAL: No fever, fatigue or weakness.  EYES: No blurred or double vision.  EARS, NOSE, AND THROAT: No tinnitus or ear pain.  RESPIRATORY: has cough, shortness of breath, wheezing but no hemoptysis.  CARDIOVASCULAR: No chest pain, orthopnea, edema.  GASTROINTESTINAL: No nausea, vomiting, diarrhea or abdominal pain.  GENITOURINARY: No dysuria, hematuria.  ENDOCRINE: No polyuria, nocturia,  HEMATOLOGY: No anemia, easy bruising or bleeding SKIN: No rash or lesion. MUSCULOSKELETAL: No joint pain or arthritis.   NEUROLOGIC: No tingling, numbness, weakness.  PSYCHIATRY: No anxiety or depression.   DRUG ALLERGIES:   Allergies  Allergen Reactions  . Ace Inhibitors Other (See Comments)    Reaction: unknown  . Amoxicillin Diarrhea    Has patient had a PCN reaction causing immediate rash, facial/tongue/throat swelling, SOB or lightheadedness with hypotension: No Has patient had a PCN reaction causing severe rash involving mucus membranes or skin necrosis: No Has patient had a PCN reaction that required hospitalization No Has patient had a PCN reaction occurring within the last 10 years: Yes If all of the above answers are "NO", then may proceed with Cephalosporin use.   . Atorvastatin Other (See Comments)    Reaction: muscle pain  . Ezetimibe Other (See Comments)    Reaction: fatigue  . Niacin And Related Other (See Comments)    Reaction: flushing  . Omeprazole Other (See Comments)    Reaction: unknown    VITALS:  Blood pressure 164/71, pulse 69, temperature 97.9 F (36.6 C), temperature source Oral, resp. rate 20, height 5\' 1"  (1.549 m), weight 64.003 kg (141 lb 1.6 oz), SpO2 95  %.  PHYSICAL EXAMINATION:  GENERAL:  82 y.o.-year-old patient lying in the bed with no acute distress.  EYES: Pupils equal, round, reactive to light and accommodation. No scleral icterus. Extraocular muscles intact.  HEENT: Head atraumatic, normocephalic. Oropharynx and nasopharynx clear.  NECK:  Supple, no jugular venous distention. No thyroid enlargement, no tenderness.  LUNGS: Normal breath sounds bilaterally, moderate expiratory wheezing, no rales,rhonchi or crepitation. No use of accessory muscles of respiration.  CARDIOVASCULAR: S1, S2 normal. No murmurs, rubs, or gallops.  ABDOMEN: Soft, nontender, nondistended. Bowel sounds present. No organomegaly or mass.  EXTREMITIES: No pedal edema, cyanosis, or clubbing.  NEUROLOGIC: Cranial nerves II through XII are intact. Muscle strength 5/5 in all extremities. Sensation intact. Gait not checked.  PSYCHIATRIC: The patient is alert and oriented x 3.  SKIN: No obvious rash, lesion, or ulcer.    LABORATORY PANEL:   CBC  Recent Labs Lab 02/05/16 2318  WBC 10.0  HGB 13.3  HCT 37.8  PLT 293   ------------------------------------------------------------------------------------------------------------------  Chemistries   Recent Labs Lab 02/05/16 2318 02/06/16 0208  NA 129* 134*  K 4.0 4.0  CL 97* 99*  CO2 24 24  GLUCOSE 130* 148*  BUN 19 19  CREATININE 0.97 0.88  CALCIUM 8.9 9.4  AST 22  --   ALT 15  --   ALKPHOS 45  --   BILITOT 0.8  --    ------------------------------------------------------------------------------------------------------------------  Cardiac Enzymes  Recent Labs Lab 02/06/16 0158  TROPONINI 0.03   ------------------------------------------------------------------------------------------------------------------  RADIOLOGY:  Dg Chest 2 View  02/06/2016  CLINICAL  DATA:  Shortness of breath beginning last week, chest pain and cough which is worse tonight. Not improving on antibiotics. EXAM: CHEST   2 VIEW COMPARISON:  Chest radiograph September 05, 2013 FINDINGS: Cardiomediastinal silhouette is normal, mildly calcified aortic knob. Mildly increased lung volumes with flattened hemidiaphragms. No pleural effusion or focal consolidation. No pneumothorax. Soft tissue planes and included osseous structure normal. IMPRESSION: Probable COPD without superimposed acute cardiopulmonary process. Electronically Signed   By: Elon Alas M.D.   On: 02/06/2016 00:04    EKG:   Orders placed or performed during the hospital encounter of 02/05/16  . EKG 12-Lead  . EKG 12-Lead    ASSESSMENT AND PLAN:   COPD exacerbation. continue nebulizers, IV steroids and discontinue levaquin.  Hyponatremia.  Improved.   HTN (hypertension) - continue home meds  HLD (hyperlipidemia) - continue home meds  GERD (gastroesophageal reflux disease) - home dose PPI   All the records are reviewed and case discussed with Care Management/Social Workerr. Management plans discussed with the patient, her son and they are in agreement.  CODE STATUS: full code.  TOTAL TIME TAKING CARE OF THIS PATIENT: 35 minutes.  Greater than 50% time was spent on coordination of care and face-to-face counseling.  POSSIBLE D/C IN 1-2 DAYS, DEPENDING ON CLINICAL CONDITION.   Demetrios Loll M.D on 02/06/2016 at 1:04 PM  Between 7am to 6pm - Pager - [phone]  After 6pm go to [email or web address] - password [email or web address]  [facility]  Office  [phone]  CC: Primary care physician; No primary care provider on file.

## 2016-02-07 DIAGNOSIS — J441 Chronic obstructive pulmonary disease with (acute) exacerbation: Secondary | ICD-10-CM | POA: Diagnosis not present

## 2016-02-07 MED ORDER — ALBUTEROL SULFATE HFA 108 (90 BASE) MCG/ACT IN AERS: 2.0000 | INHALATION_SPRAY | Freq: Four times a day (QID) | RESPIRATORY_TRACT | Status: DC | PRN

## 2016-02-07 MED ORDER — FLUTICASONE-SALMETEROL 250-50 MCG/DOSE IN AEPB: 1.0000 | INHALATION_SPRAY | Freq: Two times a day (BID) | RESPIRATORY_TRACT | Status: DC

## 2016-02-07 NOTE — Discharge Instructions (Signed)
Hearth healthy diet. Activity as tolerated.

## 2016-02-07 NOTE — Discharge Summary (Signed)
[facility] NAME: Sandra Delgado    MR#:  [id number]  DATE OF BIRTH:  1933/03/15  DATE OF ADMISSION:  02/05/2016 ADMITTING PHYSICIAN: Lance Coon, MD  DATE OF DISCHARGE: 02/07/2016  3:17 PM  PRIMARY CARE PHYSICIAN: No primary care provider on file.    ADMISSION DIAGNOSIS:  Bronchitis [J40]   DISCHARGE DIAGNOSIS:  COPD exacerbation.  SECONDARY DIAGNOSIS:   Past Medical History  Diagnosis Date  . Arthritis   . Migraine   . HTN (hypertension)   . IBS (irritable bowel syndrome)   . HLD (hyperlipidemia)   . Lymphoma ([place])   . COPD (chronic obstructive pulmonary disease) ([place])     HOSPITAL COURSE:   COPD exacerbation. Treated with nebulizers, IV steroids and levaquin. Improved.  Hyponatremia. Improved.   HTN (hypertension) - continue home meds  HLD (hyperlipidemia) - continue home meds  GERD (gastroesophageal reflux disease) - home dose PPI   DISCHARGE CONDITIONS:   Stable, discharged to home today.  CONSULTS OBTAINED:     DRUG ALLERGIES:   Allergies  Allergen Reactions  . Ace Inhibitors Other (See Comments)    Reaction: unknown  . Amoxicillin Diarrhea    Has patient had a PCN reaction causing immediate rash, facial/tongue/throat swelling, SOB or lightheadedness with hypotension: No Has patient had a PCN reaction causing severe rash involving mucus membranes or skin necrosis: No Has patient had a PCN reaction that required hospitalization No Has patient had a PCN reaction occurring within the last 10 years: Yes If all of the above answers are "NO", then may proceed with Cephalosporin use.   . Atorvastatin Other (See Comments)    Reaction: muscle pain  . Ezetimibe Other (See Comments)    Reaction: fatigue  . Niacin And Related Other (See Comments)    Reaction: flushing  . Omeprazole Other (See Comments)    Reaction: unknown    DISCHARGE MEDICATIONS:   Discharge Medication List as of  02/07/2016 11:37 AM    START taking these medications   Details  albuterol (PROVENTIL HFA;VENTOLIN HFA) 108 (90 Base) MCG/ACT inhaler Inhale 2 puffs into the lungs every 6 (six) hours as needed for wheezing or shortness of breath., Starting 02/07/2016, Until Discontinued, Print    Fluticasone-Salmeterol (ADVAIR DISKUS) 250-50 MCG/DOSE AEPB Inhale 1 puff into the lungs 2 (two) times daily., Starting 02/07/2016, Until Discontinued, Print      CONTINUE these medications which have NOT CHANGED   Details  aspirin EC 81 MG tablet Take 81 mg by mouth daily., Until Discontinued, Historical Med    carvedilol (COREG) 25 MG tablet Take 12.5 mg by mouth 2 (two) times daily., Starting 01/16/2016, Until Discontinued, Historical Med    losartan (COZAAR) 100 MG tablet Take 100 mg by mouth daily., Until Discontinued, Historical Med    omeprazole (PRILOSEC) 40 MG capsule Take 40 mg by mouth daily., Until Discontinued, Historical Med    pravastatin (PRAVACHOL) 40 MG tablet Take 40 mg by mouth daily., Until Discontinued, Historical Med      STOP taking these medications     aspirin 81 MG tablet          DISCHARGE INSTRUCTIONS:    If you experience worsening of your admission symptoms, develop shortness of breath, life threatening emergency, suicidal or homicidal thoughts you must seek medical attention immediately by calling 911 or calling your MD immediately  if symptoms less severe.  You Must read complete instructions/literature along with all the possible adverse  reactions/side effects for all the Medicines you take and that have been prescribed to you. Take any new Medicines after you have completely understood and accept all the possible adverse reactions/side effects.   Please note  You were cared for by a hospitalist during your hospital stay. If you have any questions about your discharge medications or the care you received while you were in the hospital after you are discharged, you can  call the unit and asked to speak with the hospitalist on call if the hospitalist that took care of you is not available. Once you are discharged, your primary care physician will handle any further medical issues. Please note that NO REFILLS for any discharge medications will be authorized once you are discharged, as it is imperative that you return to your primary care physician (or establish a relationship with a primary care physician if you do not have one) for your aftercare needs so that they can reassess your need for medications and monitor your lab values.    Today   SUBJECTIVE   No complaint.   VITAL SIGNS:  Blood pressure 126/57, pulse 84, temperature 98.4 F (36.9 C), temperature source Oral, resp. rate 18, height 5\' 1"  (1.549 m), weight 64.003 kg (141 lb 1.6 oz), SpO2 95 %.  I/O:   Intake/Output Summary (Last 24 hours) at 02/07/16 1741 Last data filed at 02/07/16 0800  Gross per 24 hour  Intake      0 ml  Output   1700 ml  Net  -1700 ml    PHYSICAL EXAMINATION:  GENERAL:  82 y.o.-year-old patient lying in the bed with no acute distress.  EYES: Pupils equal, round, reactive to light and accommodation. No scleral icterus. Extraocular muscles intact.  HEENT: Head atraumatic, normocephalic. Oropharynx and nasopharynx clear.  NECK:  Supple, no jugular venous distention. No thyroid enlargement, no tenderness.  LUNGS: Normal breath sounds bilaterally, no wheezing, rales,rhonchi or crepitation. No use of accessory muscles of respiration.  CARDIOVASCULAR: S1, S2 normal. No murmurs, rubs, or gallops.  ABDOMEN: Soft, non-tender, non-distended. Bowel sounds present. No organomegaly or mass.  EXTREMITIES: No pedal edema, cyanosis, or clubbing.  NEUROLOGIC: Cranial nerves II through XII are intact. Muscle strength 5/5 in all extremities. Sensation intact. Gait not checked.  PSYCHIATRIC: The patient is alert and oriented x 3.  SKIN: No obvious rash, lesion, or ulcer.   DATA  REVIEW:   CBC  Recent Labs Lab 02/05/16 2318  WBC 10.0  HGB 13.3  HCT 37.8  PLT 293    Chemistries   Recent Labs Lab 02/05/16 2318 02/06/16 0208  NA 129* 134*  K 4.0 4.0  CL 97* 99*  CO2 24 24  GLUCOSE 130* 148*  BUN 19 19  CREATININE 0.97 0.88  CALCIUM 8.9 9.4  AST 22  --   ALT 15  --   ALKPHOS 45  --   BILITOT 0.8  --     Cardiac Enzymes  Recent Labs Lab 02/06/16 0158  TROPONINI 0.03    Microbiology Results  Results for orders placed or performed in visit on 11/27/14  Urine culture     Status: None   Collection Time: 11/27/14  3:26 PM  Result Value Ref Range Status   Micro Text Report   Final       SOURCE: CLEAN CATCH    COMMENT                   MIXED BACTERIAL ORGANISMS   COMMENT  RESULTS SUGGESTIVE OF CONTAMINATION   ANTIBIOTIC                                                        RADIOLOGY:  Dg Chest 2 View  02/06/2016  CLINICAL DATA:  Shortness of breath beginning last week, chest pain and cough which is worse tonight. Not improving on antibiotics. EXAM: CHEST  2 VIEW COMPARISON:  Chest radiograph September 05, 2013 FINDINGS: Cardiomediastinal silhouette is normal, mildly calcified aortic knob. Mildly increased lung volumes with flattened hemidiaphragms. No pleural effusion or focal consolidation. No pneumothorax. Soft tissue planes and included osseous structure normal. IMPRESSION: Probable COPD without superimposed acute cardiopulmonary process. Electronically Signed   By: Elon Alas M.D.   On: 02/06/2016 00:04        Management plans discussed with the patient, family and they are in agreement.  CODE STATUS:     Code Status Orders        Start     Ordered   02/06/16 0452  Full code   Continuous     02/06/16 0451    Code Status History    Date Active Date Inactive Code Status Order ID Comments User Context   This patient has a current code status but no historical code status.    Advance Directive  Documentation        Most Recent Value   Type of Advance Directive  Healthcare Power of Attorney   Pre-existing out of facility DNR order (yellow form or pink MOST form)     "MOST" Form in Place?        TOTAL TIME TAKING CARE OF THIS PATIENT: 26 minutes.    Demetrios Loll M.D on 02/07/2016 at 5:41 PM  Between 7am to 6pm - Pager - [phone]  After 6pm go to [email or web address] - password [email or web address] [facility]  [facility]  Office  [phone]  CC: Primary care physician; No primary care provider on file.

## 2016-02-07 NOTE — Progress Notes (Signed)
Pt being discharged today, IV removed, belongings returned to pt. Discharge instructions reviewed with pt, she verified understanding. Pt rolled out in wheelchair by staff.

## 2016-11-06 ENCOUNTER — Other Ambulatory Visit: Payer: Self-pay | Admitting: Gastroenterology

## 2016-11-06 DIAGNOSIS — K219 Gastro-esophageal reflux disease without esophagitis: Secondary | ICD-10-CM

## 2016-11-11 ENCOUNTER — Ambulatory Visit
Admission: RE | Admit: 2016-11-11 | Discharge: 2016-11-11 | Disposition: A | Discharge status: Home / Self Care | Payer: Medicare Other | Source: Ambulatory Visit | Attending: Gastroenterology | Admitting: Gastroenterology

## 2016-11-11 DIAGNOSIS — K228 Other specified diseases of esophagus: Secondary | ICD-10-CM | POA: Insufficient documentation

## 2016-11-11 DIAGNOSIS — K219 Gastro-esophageal reflux disease without esophagitis: Secondary | ICD-10-CM | POA: Insufficient documentation

## 2017-01-13 ENCOUNTER — Other Ambulatory Visit: Payer: Self-pay | Admitting: Gastroenterology

## 2017-01-13 DIAGNOSIS — R1031 Right lower quadrant pain: Secondary | ICD-10-CM

## 2017-01-13 DIAGNOSIS — R1032 Left lower quadrant pain: Secondary | ICD-10-CM

## 2017-01-20 ENCOUNTER — Ambulatory Visit: Payer: Medicare Other

## 2017-02-04 ENCOUNTER — Ambulatory Visit
Admission: RE | Admit: 2017-02-04 | Discharge: 2017-02-04 | Disposition: A | Discharge status: Home / Self Care | Payer: Medicare Other | Source: Ambulatory Visit | Attending: Gastroenterology | Admitting: Gastroenterology

## 2017-02-04 DIAGNOSIS — I700 Atherosclerosis of aorta: Secondary | ICD-10-CM | POA: Diagnosis not present

## 2017-02-04 DIAGNOSIS — R1031 Right lower quadrant pain: Secondary | ICD-10-CM | POA: Diagnosis present

## 2017-02-04 DIAGNOSIS — R935 Abnormal findings on diagnostic imaging of other abdominal regions, including retroperitoneum: Secondary | ICD-10-CM | POA: Insufficient documentation

## 2017-02-04 DIAGNOSIS — R1032 Left lower quadrant pain: Secondary | ICD-10-CM | POA: Insufficient documentation

## 2017-02-04 MED ORDER — IOPAMIDOL (ISOVUE-300) INJECTION 61%
100.0000 mL | Freq: Once | INTRAVENOUS | Status: AC | PRN
  Administered 2017-02-04: 100 mL via INTRAVENOUS

## 2017-02-10 ENCOUNTER — Ambulatory Visit: Payer: Medicare Other

## 2017-10-06 ENCOUNTER — Encounter: Payer: Self-pay | Admitting: Emergency Medicine

## 2017-10-06 ENCOUNTER — Observation Stay
Admission: EM | Admit: 2017-10-06 | Discharge: 2017-10-07 | Disposition: A | Discharge status: Home / Self Care | Payer: Medicare Other | Attending: Internal Medicine | Admitting: Internal Medicine

## 2017-10-06 ENCOUNTER — Emergency Department: Payer: Medicare Other

## 2017-10-06 DIAGNOSIS — M1990 Unspecified osteoarthritis, unspecified site: Secondary | ICD-10-CM | POA: Insufficient documentation

## 2017-10-06 DIAGNOSIS — R0789 Other chest pain: Secondary | ICD-10-CM

## 2017-10-06 DIAGNOSIS — J449 Chronic obstructive pulmonary disease, unspecified: Secondary | ICD-10-CM | POA: Insufficient documentation

## 2017-10-06 DIAGNOSIS — R7989 Other specified abnormal findings of blood chemistry: Secondary | ICD-10-CM

## 2017-10-06 DIAGNOSIS — R778 Other specified abnormalities of plasma proteins: Secondary | ICD-10-CM

## 2017-10-06 DIAGNOSIS — I2510 Atherosclerotic heart disease of native coronary artery without angina pectoris: Secondary | ICD-10-CM | POA: Insufficient documentation

## 2017-10-06 DIAGNOSIS — I447 Left bundle-branch block, unspecified: Secondary | ICD-10-CM | POA: Insufficient documentation

## 2017-10-06 DIAGNOSIS — E785 Hyperlipidemia, unspecified: Secondary | ICD-10-CM | POA: Diagnosis not present

## 2017-10-06 DIAGNOSIS — I214 Non-ST elevation (NSTEMI) myocardial infarction: Secondary | ICD-10-CM | POA: Diagnosis not present

## 2017-10-06 DIAGNOSIS — K589 Irritable bowel syndrome without diarrhea: Secondary | ICD-10-CM | POA: Diagnosis not present

## 2017-10-06 DIAGNOSIS — N183 Chronic kidney disease, stage 3 (moderate): Secondary | ICD-10-CM | POA: Diagnosis not present

## 2017-10-06 DIAGNOSIS — I129 Hypertensive chronic kidney disease with stage 1 through stage 4 chronic kidney disease, or unspecified chronic kidney disease: Secondary | ICD-10-CM | POA: Insufficient documentation

## 2017-10-06 DIAGNOSIS — K219 Gastro-esophageal reflux disease without esophagitis: Secondary | ICD-10-CM | POA: Insufficient documentation

## 2017-10-06 DIAGNOSIS — G43909 Migraine, unspecified, not intractable, without status migrainosus: Secondary | ICD-10-CM | POA: Insufficient documentation

## 2017-10-06 DIAGNOSIS — Z8572 Personal history of non-Hodgkin lymphomas: Secondary | ICD-10-CM | POA: Diagnosis not present

## 2017-10-06 HISTORY — DX: Chronic kidney disease, stage 3 unspecified: N18.30

## 2017-10-06 HISTORY — DX: Chronic kidney disease, stage 3 (moderate): N18.3

## 2017-10-06 LAB — BASIC METABOLIC PANEL
Anion gap: 12 (ref 5–15)
BUN: 18 mg/dL (ref 6–20)
CHLORIDE: 96 mmol/L — AB (ref 101–111)
CO2: 24 mmol/L (ref 22–32)
Calcium: 9.3 mg/dL (ref 8.9–10.3)
Creatinine, Ser: 1.02 mg/dL — ABNORMAL HIGH (ref 0.44–1.00)
GFR calc Af Amer: 58 mL/min — ABNORMAL LOW (ref >60)
GFR calc non Af Amer: 50 mL/min — ABNORMAL LOW (ref >60)
GLUCOSE: 164 mg/dL — AB (ref 65–99)
POTASSIUM: 3.6 mmol/L (ref 3.5–5.1)
Sodium: 132 mmol/L — ABNORMAL LOW (ref 135–145)

## 2017-10-06 LAB — HEPATIC FUNCTION PANEL
ALBUMIN: 3.8 g/dL (ref 3.5–5.0)
ALK PHOS: 35 U/L — AB (ref 38–126)
ALT: 14 U/L (ref 14–54)
AST: 22 U/L (ref 15–41)
BILIRUBIN DIRECT: 0.1 mg/dL (ref 0.1–0.5)
BILIRUBIN INDIRECT: 1 mg/dL — AB (ref 0.3–0.9)
BILIRUBIN TOTAL: 1.1 mg/dL (ref 0.3–1.2)
Total Protein: 6.4 g/dL — ABNORMAL LOW (ref 6.5–8.1)

## 2017-10-06 LAB — LIPID PANEL
CHOL/HDL RATIO: 2.7 ratio
Cholesterol: 160 mg/dL (ref 0–200)
HDL: 59 mg/dL (ref >40)
LDL Cholesterol: 73 mg/dL (ref 0–99)
TRIGLYCERIDES: 142 mg/dL (ref <150)
VLDL: 28 mg/dL (ref 0–40)

## 2017-10-06 LAB — APTT: aPTT: <24 seconds — ABNORMAL LOW (ref 24–36)

## 2017-10-06 LAB — CBC
HEMATOCRIT: 39.8 % (ref 35.0–47.0)
Hemoglobin: 13.8 g/dL (ref 12.0–16.0)
MCH: 32.4 pg (ref 26.0–34.0)
MCHC: 34.6 g/dL (ref 32.0–36.0)
MCV: 93.5 fL (ref 80.0–100.0)
Platelets: 303 10*3/uL (ref 150–440)
RBC: 4.26 MIL/uL (ref 3.80–5.20)
RDW: 12.3 % (ref 11.5–14.5)
WBC: 10.3 10*3/uL (ref 3.6–11.0)

## 2017-10-06 LAB — TROPONIN I
TROPONIN I: 1.29 ng/mL — AB (ref <0.03)
TROPONIN I: 1.71 ng/mL — AB (ref <0.03)
Troponin I: 0.09 ng/mL (ref <0.03)

## 2017-10-06 LAB — PROTIME-INR
INR: 0.96
Prothrombin Time: 12.7 seconds (ref 11.4–15.2)

## 2017-10-06 LAB — LIPASE, BLOOD: LIPASE: 24 U/L (ref 11–51)

## 2017-10-06 MED ORDER — ACETAMINOPHEN 325 MG PO TABS
650.0000 mg | ORAL_TABLET | Freq: Four times a day (QID) | ORAL | Status: DC | PRN
  Administered 2017-10-06: 650 mg via ORAL
  Filled 2017-10-06: qty 2

## 2017-10-06 MED ORDER — PANTOPRAZOLE SODIUM 40 MG IV SOLR
40.0000 mg | Freq: Two times a day (BID) | INTRAVENOUS | Status: DC
  Administered 2017-10-06 – 2017-10-07 (×2): 40 mg via INTRAVENOUS
  Filled 2017-10-06 (×2): qty 40

## 2017-10-06 MED ORDER — PNEUMOCOCCAL VAC POLYVALENT 25 MCG/0.5ML IJ INJ: 0.5000 mL | INJECTION | INTRAMUSCULAR | Status: DC

## 2017-10-06 MED ORDER — NITROGLYCERIN 2 % TD OINT
0.5000 [in_us] | TOPICAL_OINTMENT | Freq: Four times a day (QID) | TRANSDERMAL | Status: DC
  Administered 2017-10-06 – 2017-10-07 (×2): 0.5 [in_us] via TOPICAL
  Filled 2017-10-06 (×2): qty 1

## 2017-10-06 MED ORDER — CARVEDILOL 3.125 MG PO TABS
3.1250 mg | ORAL_TABLET | Freq: Two times a day (BID) | ORAL | Status: DC
  Administered 2017-10-06 – 2017-10-07 (×2): 3.125 mg via ORAL
  Filled 2017-10-06 (×2): qty 1

## 2017-10-06 MED ORDER — HEPARIN BOLUS VIA INFUSION
3800.0000 [IU] | Freq: Once | INTRAVENOUS | Status: AC
  Administered 2017-10-06: 3800 [IU] via INTRAVENOUS
  Filled 2017-10-06: qty 3800

## 2017-10-06 MED ORDER — ALUM & MAG HYDROXIDE-SIMETH 200-200-20 MG/5ML PO SUSP
15.0000 mL | Freq: Once | ORAL | Status: AC
  Administered 2017-10-06: 15 mL via ORAL
  Filled 2017-10-06: qty 30

## 2017-10-06 MED ORDER — ASPIRIN EC 81 MG PO TBEC
81.0000 mg | DELAYED_RELEASE_TABLET | Freq: Every day | ORAL | Status: DC
  Administered 2017-10-07: 81 mg via ORAL
  Filled 2017-10-06 (×2): qty 1

## 2017-10-06 MED ORDER — FAMOTIDINE 20 MG PO TABS
40.0000 mg | ORAL_TABLET | Freq: Once | ORAL | Status: AC
  Administered 2017-10-06: 40 mg via ORAL
  Filled 2017-10-06: qty 2

## 2017-10-06 MED ORDER — ONDANSETRON HCL 4 MG/2ML IJ SOLN
4.0000 mg | Freq: Four times a day (QID) | INTRAMUSCULAR | Status: DC | PRN
  Administered 2017-10-06: 4 mg via INTRAVENOUS
  Filled 2017-10-06: qty 2

## 2017-10-06 MED ORDER — ALUM & MAG HYDROXIDE-SIMETH 200-200-20 MG/5ML PO SUSP: 30.0000 mL | Freq: Four times a day (QID) | ORAL | Status: DC | PRN

## 2017-10-06 MED ORDER — ACETAMINOPHEN 325 MG PO TABS
650.0000 mg | ORAL_TABLET | Freq: Four times a day (QID) | ORAL | Status: DC | PRN
  Administered 2017-10-06 – 2017-10-07 (×2): 650 mg via ORAL
  Filled 2017-10-06 (×2): qty 2

## 2017-10-06 MED ORDER — NITROGLYCERIN 0.4 MG SL SUBL
0.4000 mg | SUBLINGUAL_TABLET | SUBLINGUAL | Status: DC | PRN
  Administered 2017-10-06: 0.4 mg via SUBLINGUAL
  Filled 2017-10-06: qty 1

## 2017-10-06 MED ORDER — HEPARIN (PORCINE) IN NACL 100-0.45 UNIT/ML-% IJ SOLN
650.0000 [IU]/h | INTRAMUSCULAR | Status: DC
  Administered 2017-10-06: 750 [IU]/h via INTRAVENOUS
  Filled 2017-10-06: qty 250

## 2017-10-06 MED ORDER — PRAVASTATIN SODIUM 40 MG PO TABS
40.0000 mg | ORAL_TABLET | Freq: Every day | ORAL | Status: DC
  Administered 2017-10-06: 40 mg via ORAL
  Filled 2017-10-06: qty 1

## 2017-10-06 MED ORDER — LOSARTAN POTASSIUM 50 MG PO TABS
100.0000 mg | ORAL_TABLET | Freq: Every day | ORAL | Status: DC
  Administered 2017-10-07: 100 mg via ORAL
  Filled 2017-10-06: qty 2

## 2017-10-06 MED ORDER — SODIUM CHLORIDE 0.9 % IV SOLN
INTRAVENOUS | Status: DC
  Administered 2017-10-06: 17:00:00 via INTRAVENOUS

## 2017-10-06 MED ORDER — ACETAMINOPHEN 650 MG RE SUPP: 650.0000 mg | Freq: Four times a day (QID) | RECTAL | Status: DC | PRN

## 2017-10-06 MED ORDER — ONDANSETRON HCL 4 MG PO TABS: 4.0000 mg | ORAL_TABLET | Freq: Four times a day (QID) | ORAL | Status: DC | PRN

## 2017-10-06 NOTE — ED Provider Notes (Signed)
[facility] Emergency Department Provider Note  ____________________________________________  Time seen: Approximately 2:43 PM  I have reviewed the triage vital signs and the nursing notes.   HISTORY  Chief Complaint Chest Pain  Level 5 Caveat: Portions of the History and Physical are unable to be obtained due to patient being a poor historian   HPI Sandra Delgado is a 83 y.o. female who complains of central chest pain described as burning, nonradiating, constant, no aggravating or alleviating factors, associated with shortness of breath but no diaphoresis or vomiting, started about 12:00 PM today after eating some [place] and cheese. Denies exertional component today. Reports that she had similar symptoms in the past and that her cardiologist Dr. Nehemiah Massed was worried so a cardiac catheterization was performed 3 or 4 months ago.     Past Medical History:  Diagnosis Date  . Arthritis   . COPD (chronic obstructive pulmonary disease) ([place])   . HLD (hyperlipidemia)   . HTN (hypertension)   . IBS (irritable bowel syndrome)   . Lymphoma ([place])   . Migraine      Patient Active Problem List   Diagnosis Date Noted  . COPD exacerbation ([place]) 02/06/2016  . HLD (hyperlipidemia) 02/06/2016  . HTN (hypertension) 02/06/2016  . GERD (gastroesophageal reflux disease) 02/06/2016     Past Surgical History:  Procedure Laterality Date  . ABDOMINAL HYSTERECTOMY    . APPENDECTOMY    . COLONOSCOPY       Prior to Admission medications   Medication Sig Start Date End Date Taking? Authorizing Provider  albuterol (PROVENTIL HFA;VENTOLIN HFA) 108 (90 Base) MCG/ACT inhaler Inhale 2 puffs into the lungs every 6 (six) hours as needed for wheezing or shortness of breath. 02/07/16   Demetrios Loll, MD  aspirin EC 81 MG tablet Take 81 mg by mouth daily.    [provider]  carvedilol (COREG) 25 MG tablet Take 12.5 mg by mouth 2 (two) times daily. 01/16/16   [provider]  Fluticasone-Salmeterol (ADVAIR DISKUS) 250-50 MCG/DOSE AEPB Inhale 1 puff into the lungs 2 (two) times daily. 02/07/16   Demetrios Loll, MD  losartan (COZAAR) 100 MG tablet Take 100 mg by mouth daily.    [provider]  omeprazole (PRILOSEC) 40 MG capsule Take 40 mg by mouth daily.    [provider]  pravastatin (PRAVACHOL) 40 MG tablet Take 40 mg by mouth daily.    [provider]     Allergies Ace inhibitors; Amoxicillin; Atorvastatin; Ezetimibe; Niacin and related; and Omeprazole   Family History  Problem Relation Age of Onset  . Colon cancer Mother     Social History Social History  Substance Use Topics  . Smoking status: Never Smoker  . Smokeless tobacco: Never Used  . Alcohol use 0.0 oz/week     Comment: occassional    Review of Systems  Constitutional:   No fever or chills.  ENT:   No sore throat. No rhinorrhea. Cardiovascular:   positive as above chest pain without syncope. Respiratory:   positive shortness of breath without cough. Gastrointestinal:   Negative for abdominal pain, vomiting and diarrhea.  Musculoskeletal:   Negative for focal pain or swelling All other systems reviewed and are negative except as documented above in ROS and HPI.  ____________________________________________   PHYSICAL EXAM:  VITAL SIGNS: ED Triage Vitals  Enc Vitals Group     BP 10/06/17 1339 127/70     Pulse Rate 10/06/17 1339 68  Resp 10/06/17 1339 16     Temp 10/06/17 1339 97.7 F (36.5 C)     Temp Source 10/06/17 1339 Oral     SpO2 10/06/17 1339 98 %     Weight 10/06/17 1340 140 lb (63.5 kg)     Height 10/06/17 1340 5\' 1"  (1.549 m)     Head Circumference --      Peak Flow --      Pain Score 10/06/17 1339 7     Pain Loc --      Pain Edu? --      Excl. in [place]? --     Vital signs reviewed, nursing assessments reviewed.   Constitutional:   Alert and oriented. not in distress Eyes:   No scleral icterus.  EOMI. No nystagmus.  No conjunctival pallor. PERRL. ENT   Head:   Normocephalic and atraumatic.   Nose:   No congestion/rhinnorhea.    Mouth/Throat:   MMM, no pharyngeal erythema. No peritonsillar mass.    Neck:   No meningismus. Full ROM. Hematological/Lymphatic/Immunilogical:   No cervical lymphadenopathy. Cardiovascular:   RRR. Symmetric bilateral radial and DP pulses.  No murmurs.  Respiratory:   Normal respiratory effort without tachypnea/retractions. Breath sounds are clear and equal bilaterally. No wheezes/rales/rhonchi. Gastrointestinal:   Soft with epigastric and left upper quadrant tenderness, mild. Non distended. There is no CVA tenderness.  No rebound, rigidity, or guarding. Genitourinary:   deferred Musculoskeletal:   Normal range of motion in all extremities. No joint effusions.  No lower extremity tenderness.  No edema. Neurologic:   Normal speech and language.  Motor grossly intact. No gross focal neurologic deficits are appreciated.  Skin:    Skin is warm, dry and intact. No rash noted.  No petechiae, purpura, or bullae.  ____________________________________________    LABS (pertinent positives/negatives) (all labs ordered are listed, but only abnormal results are displayed) Labs Reviewed  BASIC METABOLIC PANEL - Abnormal; Notable for the following:       Result Value   Sodium 132 (*)    Chloride 96 (*)    Glucose, Bld 164 (*)    Creatinine, Ser 1.02 (*)    GFR calc non Af Amer 50 (*)    GFR calc Af Amer 58 (*)    All other components within normal limits  TROPONIN I - Abnormal; Notable for the following:    Troponin I 0.09 (*)    All other components within normal limits  CBC  HEPATIC FUNCTION PANEL  LIPASE, BLOOD   ____________________________________________   EKG  interpreted by me Normal sinus rhythm rate is 64, left axis, left bundle branch block. No acute ischemic changes.  ____________________________________________    RADIOLOGY  Dg Chest 2  View  Result Date: 10/06/2017 CLINICAL DATA:  Onset of chest pain with nausea and noon today. History of lymphoma, COPD. EXAM: CHEST  2 VIEW COMPARISON:  Chest x-ray of February 05, 2016 FINDINGS: The lungs are hyperinflated. There is no focal infiltrate. The heart is top-normal in size. The pulmonary vascularity is normal. There is calcification in the wall of the aortic arch. There is no pleural effusion or pneumothorax. The observed bony thorax is unremarkable. IMPRESSION: Mild hyperinflation consistent with COPD. Top-normal cardiac size. There is no pneumonia nor CHF. Thoracic aortic atherosclerosis. Electronically Signed   By: David  Martinique M.D.   On: 10/06/2017 14:18    ____________________________________________   PROCEDURES Procedures  ____________________________________________   DIFFERENTIAL DIAGNOSIS  non-STEMI, GERD, gastritis, pancreatitis  CLINICAL IMPRESSION /  ASSESSMENT AND PLAN / ED COURSE  Pertinent labs & imaging results that were available during my care of the patient were reviewed by me and considered in my medical decision making (see chart for details).   patient not in distress, presents with somewhat atypical chest pain. Reviewing the electronic medical record, this is similar to pain she has had in the past that prompted cardiology workup. She had a stress test that was significant for burning chest pain with exertion, but apparently never followed up to have further investigation. I called the cardiology clinic and they confirm that she has not had a cardiac catheterization this year and the last time they saw her was January 2018. Vital signs are normal, EKG is nondiagnostic, but troponin is 0.09. In the past her troponins have been normal. We'll continue sublingual nitroglycerin in attempt to get her pain-free. Patient and daughter updated on results and plan, they agree with hospitalization for further evaluation of her symptoms. Low suspicion of this point  of PE dissection AAA cholecystitis. Added on LFTs.      ----------------------------------------- 2:48 PM on 10/06/2017 -----------------------------------------  D/w hospitalist. Given atypical nature of sx and low degree of trop elevation, would not tx with heparin for nstemi at this time. Would trend trop and monitor sx.  Will defer ongoing care to hospitalist.   ____________________________________________   FINAL CLINICAL IMPRESSION(S) / ED DIAGNOSES    Final diagnoses:  Atypical chest pain  Elevated troponin      New Prescriptions   No medications on file     Portions of this note were generated with dragon dictation software. Dictation errors may occur despite best attempts at proofreading.    Carrie Mew, MD 10/06/17 [phone]

## 2017-10-06 NOTE — ED Triage Notes (Signed)
Arrives with c/i SSCP z 1 hour.  C/O burning feeling imd chest and pain to left chest.  1 SL NTG and 324 ASA given by EMS PTA.  Patient AAOx3.  Skin warm and dry.  C/o nausea

## 2017-10-06 NOTE — Progress Notes (Signed)
ANTICOAGULATION CONSULT NOTE - Initial Consult  Pharmacy Consult for Heparin Drip Dosing and Monitoring  Indication: chest pain/ACS  Allergies  Allergen Reactions  . Ace Inhibitors Other (See Comments)    Reaction: unknown  . Amoxicillin Diarrhea    Has patient had a PCN reaction causing immediate rash, facial/tongue/throat swelling, SOB or lightheadedness with hypotension: No Has patient had a PCN reaction causing severe rash involving mucus membranes or skin necrosis: No Has patient had a PCN reaction that required hospitalization No Has patient had a PCN reaction occurring within the last 10 years: Yes If all of the above answers are "NO", then may proceed with Cephalosporin use.   . Atorvastatin Other (See Comments)    Reaction: muscle pain  . Ezetimibe Other (See Comments)    Reaction: fatigue  . Niacin And Related Other (See Comments)    Reaction: flushing    Patient Measurements: Height: 5\' 1"  (154.9 cm) Weight: 140 lb (63.5 kg) IBW/kg (Calculated) : 47.8  Vital Signs: Temp: 97.7 F (36.5 C) (10/16 1339) Temp Source: Oral (10/16 1339) BP: 127/70 (10/16 1339) Pulse Rate: 68 (10/16 1339)  Labs:  Recent Labs  10/06/17 1340  HGB 13.8  HCT 39.8  PLT 303  CREATININE 1.02*  TROPONINI 0.09*    Estimated Creatinine Clearance: 36.3 mL/min (A) (by C-G formula based on SCr of 1.02 mg/dL (H)).   Medical History: Past Medical History:  Diagnosis Date  . Arthritis   . CKD (chronic kidney disease) stage 3, GFR 30-59 ml/min (HCC)   . COPD (chronic obstructive pulmonary disease) ([place])   . HLD (hyperlipidemia)   . HTN (hypertension)   . IBS (irritable bowel syndrome)   . Lymphoma ([place])   . Migraine    Assessment: Pharmacy consulted for heparin drip dosing and monitoring in 83 yo female admitted with NSTEMI.  Goal of Therapy:  Heparin level 0.3-0.7 units/ml Monitor platelets by anticoagulation protocol: Yes   Plan:  Baseline labs ordered  Give 3800 units  bolus x 1 Start heparin infusion at 750 units/hr Check anti-Xa level in 8 hours and daily while on heparin Continue to monitor H&H and platelets  Pernell Dupre, PharmD, BCPS Clinical Pharmacist 10/06/2017 3:23 PM

## 2017-10-06 NOTE — Progress Notes (Signed)
Patient admitted to unit. Oriented to room, call bell, and staff. Bed in lowest position. Fall safety plan reviewed. Full assessment to Epic. Skin assessment verified with Georgina Quint, RN. Telemetry box verification with tele clerk- Box#: [id number]. Will continue to monitor.

## 2017-10-06 NOTE — H&P (Signed)
[facility] [place] NAME: [name]    MR#:  [id number]  DATE OF BIRTH:  [birth year]  DATE OF ADMISSION:  10/06/2017  PRIMARY CARE PHYSICIAN: Kirk Ruths, MD   REQUESTING/REFERRING PHYSICIAN: Dr. Carrie Mew  CHIEF COMPLAINT:   Chief Complaint  Patient presents with  . Chest Pain    HISTORY OF PRESENT ILLNESS:  Sandra Delgado  is a 83 y.o. female with a known history of hypertension, CKD stage III, IBS, COPD not on home oxygen presents to hospital secondary to worsening heartburn associated with left arm heaviness. Patient is very active at baseline, walks on the treadmill 3 times a week. She has had occasional heartburn sensation for which she is on Prilosec 40 mg twice a day. The heartburn sometimes gets worse with exercise.she has been seeing the cardiologist, had a cardiac catheterization several years ago which showed nonocclusive CAD. No prior history of stents or CABG. Her last stress test was December 2017 which was normal. Patient states she has been doing well up until today, she ate a [place] sandwich for lunch and was having worsening heartburn sensation which as not relieved with Rolaids or tums. She was starting to get heaviness in her left arm. No diaphoresis, also associated with nausea. EKG shows left bundle branch block which is chronic. First troponin is slightly elevated at 0.09. Patient does not take any aspirin at home as it worsens her GERD symptoms.  PAST MEDICAL HISTORY:   Past Medical History:  Diagnosis Date  . Arthritis   . CKD (chronic kidney disease) stage 3, GFR 30-59 ml/min (HCC)   . COPD (chronic obstructive pulmonary disease) ([place])   . HLD (hyperlipidemia)   . HTN (hypertension)   . IBS (irritable bowel syndrome)   . Lymphoma ([place])   . Migraine     PAST SURGICAL HISTORY:   Past Surgical History:  Procedure Laterality Date  . ABDOMINAL HYSTERECTOMY    . APPENDECTOMY    . COLONOSCOPY       SOCIAL HISTORY:   Social History  Substance Use Topics  . Smoking status: Never Smoker  . Smokeless tobacco: Never Used  . Alcohol use 0.0 oz/week     Comment: occassional    FAMILY HISTORY:   Family History  Problem Relation Age of Onset  . Colon cancer Mother     DRUG ALLERGIES:   Allergies  Allergen Reactions  . Ace Inhibitors Other (See Comments)    Reaction: unknown  . Amoxicillin Diarrhea    Has patient had a PCN reaction causing immediate rash, facial/tongue/throat swelling, SOB or lightheadedness with hypotension: No Has patient had a PCN reaction causing severe rash involving mucus membranes or skin necrosis: No Has patient had a PCN reaction that required hospitalization No Has patient had a PCN reaction occurring within the last 10 years: Yes If all of the above answers are "NO", then may proceed with Cephalosporin use.   . Atorvastatin Other (See Comments)    Reaction: muscle pain  . Ezetimibe Other (See Comments)    Reaction: fatigue  . Niacin And Related Other (See Comments)    Reaction: flushing    REVIEW OF SYSTEMS:   Review of Systems  Constitutional: Negative for chills, fever, malaise/fatigue and weight loss.  HENT: Negative for ear discharge, ear pain, hearing loss and nosebleeds.   Eyes: Negative for blurred vision, double vision and photophobia.  Respiratory: Negative for cough, hemoptysis, shortness of breath and wheezing.  Cardiovascular: Positive for chest pain. Negative for palpitations, orthopnea and leg swelling.  Gastrointestinal: Positive for heartburn and nausea. Negative for abdominal pain, constipation, diarrhea, melena and vomiting.  Genitourinary: Negative for dysuria, frequency, hematuria and urgency.  Musculoskeletal: Negative for back pain, myalgias and neck pain.  Skin: Negative for rash.  Neurological: Negative for dizziness, tingling, tremors, sensory change, speech change, focal weakness and headaches.    Endo/Heme/Allergies: Does not bruise/bleed easily.  Psychiatric/Behavioral: Negative for depression.    MEDICATIONS AT HOME:   Prior to Admission medications   Medication Sig Start Date End Date Taking? Authorizing Provider  carvedilol (COREG) 3.125 MG tablet Take 3.125 mg by mouth 2 (two) times daily with a meal.   Yes [provider]  losartan (COZAAR) 100 MG tablet Take 100 mg by mouth daily.   Yes [provider]  omeprazole (PRILOSEC) 40 MG capsule Take 40 mg by mouth 2 (two) times daily.    Yes [provider]  pravastatin (PRAVACHOL) 40 MG tablet Take 40 mg by mouth at bedtime.     [provider]      VITAL SIGNS:  Blood pressure 127/70, pulse 68, temperature 97.7 F (36.5 C), temperature source Oral, resp. rate 16, height 5\' 1"  (1.549 m), weight 63.5 kg (140 lb), SpO2 98 %.  PHYSICAL EXAMINATION:   Physical Exam  GENERAL:  83 y.o.-year-old elderly patient lying in the bed with no acute distress.  EYES: Pupils equal, round, reactive to light and accommodation. No scleral icterus. Extraocular muscles intact.  HEENT: Head atraumatic, normocephalic. Oropharynx and nasopharynx clear.  NECK:  Supple, no jugular venous distention. No thyroid enlargement, no tenderness.  LUNGS: Normal breath sounds bilaterally, no wheezing, rales,rhonchi or crepitation. No use of accessory muscles of respiration.  CARDIOVASCULAR: S1, S2 normal. No   rubs, or gallops. 2/6 systolic murmur present ABDOMEN: Soft, nontender, nondistended. Bowel sounds present. No organomegaly or mass.  EXTREMITIES: No pedal edema, cyanosis, or clubbing.  NEUROLOGIC: Cranial nerves II through XII are intact. Muscle strength 5/5 in all extremities. Sensation intact. Gait not checked.  PSYCHIATRIC: The patient is alert and oriented x 3.  SKIN: No obvious rash, lesion, or ulcer.   LABORATORY PANEL:   CBC  Recent Labs Lab 10/06/17 1340  WBC 10.3  HGB 13.8  HCT 39.8  PLT 303    ------------------------------------------------------------------------------------------------------------------  Chemistries   Recent Labs Lab 10/06/17 1340  NA 132*  K 3.6  CL 96*  CO2 24  GLUCOSE 164*  BUN 18  CREATININE 1.02*  CALCIUM 9.3  AST 22  ALT 14  ALKPHOS 35*  BILITOT 1.1   ------------------------------------------------------------------------------------------------------------------  Cardiac Enzymes  Recent Labs Lab 10/06/17 1340  TROPONINI 0.09*   ------------------------------------------------------------------------------------------------------------------  RADIOLOGY:  Dg Chest 2 View  Result Date: 10/06/2017 CLINICAL DATA:  Onset of chest pain with nausea and noon today. History of lymphoma, COPD. EXAM: CHEST  2 VIEW COMPARISON:  Chest x-ray of February 05, 2016 FINDINGS: The lungs are hyperinflated. There is no focal infiltrate. The heart is top-normal in size. The pulmonary vascularity is normal. There is calcification in the wall of the aortic arch. There is no pleural effusion or pneumothorax. The observed bony thorax is unremarkable. IMPRESSION: Mild hyperinflation consistent with COPD. Top-normal cardiac size. There is no pneumonia nor CHF. Thoracic aortic atherosclerosis. Electronically Signed   By: David  Martinique M.D.   On: 10/06/2017 14:18    EKG:   Orders placed or performed during the hospital encounter of 10/06/17  .  ED EKG within 10 minutes  . ED EKG within 10 minutes    IMPRESSION AND PLAN:   Chloey Ricard  is a 83 y.o. female with a known history of hypertension, CKD stage III, IBS, COPD not on home oxygen presents to hospital secondary to worsening heartburn associated with left arm heaviness.  #1 NSTEMI-atypical presentation, admitted to telemetry - cardiology consulted. Echocardiogram ordered -Recycle troponins. Started on heparin drip -If Troponins remain  stable, might need a stress test. If worsens-will need a cardiac  catheterization -Started enteric-coated aspirin, already on statin. Check lipid panel - on coreg  #2 GERD- changed to protonix IV for now  #3 IBS- stable  #4 DVT Prophylaxis- on heparin gtt    All the records are reviewed and case discussed with ED provider. Management plans discussed with the patient, family and they are in agreement.  CODE STATUS: Full Code  TOTAL TIME TAKING CARE OF THIS PATIENT: 50 minutes.    Taji Barretto M.D on 10/06/2017 at 3:15 PM  Between 7am to 6pm - Pager - [phone]  After 6pm go to [email or web address] - password [email or web address] [facility]  Office  [phone]  CC: Primary care physician; Kirk Ruths, MD

## 2017-10-06 NOTE — Progress Notes (Signed)
Date and time results received: 10/06/17 1817 (use smartphrase ".now" to insert current time)  Test: TROPONIN Critical Value: 1.29  Name of Provider Notified: Dr, Viann Shove text paged

## 2017-10-06 NOTE — ED Notes (Signed)
AAOx3.  Skin warm and dry.  NAD 

## 2017-10-07 ENCOUNTER — Encounter
Admission: EM | Disposition: A | Discharge status: Home / Self Care | Payer: Self-pay | Source: Home / Self Care | Attending: Emergency Medicine

## 2017-10-07 ENCOUNTER — Encounter: Payer: Self-pay | Admitting: Emergency Medicine

## 2017-10-07 HISTORY — PX: LEFT HEART CATH AND CORONARY ANGIOGRAPHY: CATH118249

## 2017-10-07 LAB — CBC
HEMATOCRIT: 38.4 % (ref 35.0–47.0)
HEMOGLOBIN: 13.5 g/dL (ref 12.0–16.0)
MCH: 32.8 pg (ref 26.0–34.0)
MCHC: 35.1 g/dL (ref 32.0–36.0)
MCV: 93.5 fL (ref 80.0–100.0)
Platelets: 263 10*3/uL (ref 150–440)
RBC: 4.11 MIL/uL (ref 3.80–5.20)
RDW: 12.3 % (ref 11.5–14.5)
WBC: 8.8 10*3/uL (ref 3.6–11.0)

## 2017-10-07 LAB — BASIC METABOLIC PANEL
ANION GAP: 7 (ref 5–15)
BUN: 14 mg/dL (ref 6–20)
CHLORIDE: 102 mmol/L (ref 101–111)
CO2: 26 mmol/L (ref 22–32)
Calcium: 9 mg/dL (ref 8.9–10.3)
Creatinine, Ser: 0.87 mg/dL (ref 0.44–1.00)
GFR calc Af Amer: >60 mL/min (ref >60)
Glucose, Bld: 108 mg/dL — ABNORMAL HIGH (ref 65–99)
POTASSIUM: 4 mmol/L (ref 3.5–5.1)
SODIUM: 135 mmol/L (ref 135–145)

## 2017-10-07 LAB — TROPONIN I: TROPONIN I: 1.43 ng/mL — AB (ref <0.03)

## 2017-10-07 LAB — HEPARIN LEVEL (UNFRACTIONATED): Heparin Unfractionated: 0.81 IU/mL — ABNORMAL HIGH (ref 0.30–0.70)

## 2017-10-07 SURGERY — LEFT HEART CATH AND CORONARY ANGIOGRAPHY
Anesthesia: Moderate Sedation

## 2017-10-07 MED ORDER — ONDANSETRON HCL 4 MG/2ML IJ SOLN: 4.0000 mg | Freq: Four times a day (QID) | INTRAMUSCULAR | Status: DC | PRN

## 2017-10-07 MED ORDER — ATORVASTATIN CALCIUM 20 MG PO TABS: 40.0000 mg | ORAL_TABLET | Freq: Every day | ORAL | Status: DC

## 2017-10-07 MED ORDER — FENTANYL CITRATE (PF) 100 MCG/2ML IJ SOLN
INTRAMUSCULAR | Status: DC | PRN
  Administered 2017-10-07: 25 ug via INTRAVENOUS

## 2017-10-07 MED ORDER — ASPIRIN 81 MG PO TBEC: 81.0000 mg | DELAYED_RELEASE_TABLET | Freq: Every day | ORAL | 0 refills | Status: AC

## 2017-10-07 MED ORDER — MIDAZOLAM HCL 2 MG/2ML IJ SOLN
INTRAMUSCULAR | Status: AC
  Filled 2017-10-07: qty 2

## 2017-10-07 MED ORDER — SODIUM CHLORIDE 0.9 % IV SOLN: 250.0000 mL | INTRAVENOUS | Status: DC | PRN

## 2017-10-07 MED ORDER — SODIUM CHLORIDE 0.9% FLUSH: 3.0000 mL | INTRAVENOUS | Status: DC | PRN

## 2017-10-07 MED ORDER — SODIUM CHLORIDE 0.9 % WEIGHT BASED INFUSION: 1.0000 mL/kg/h | INTRAVENOUS | Status: DC

## 2017-10-07 MED ORDER — IOPAMIDOL (ISOVUE-300) INJECTION 61%
INTRAVENOUS | Status: DC | PRN
  Administered 2017-10-07: 55 mL via INTRA_ARTERIAL

## 2017-10-07 MED ORDER — SODIUM CHLORIDE 0.9% FLUSH: 3.0000 mL | Freq: Two times a day (BID) | INTRAVENOUS | Status: DC

## 2017-10-07 MED ORDER — SODIUM CHLORIDE 0.9 % WEIGHT BASED INFUSION
3.0000 mL/kg/h | INTRAVENOUS | Status: DC
  Administered 2017-10-07: 3 mL/kg/h via INTRAVENOUS

## 2017-10-07 MED ORDER — MIDAZOLAM HCL 2 MG/2ML IJ SOLN
INTRAMUSCULAR | Status: DC | PRN
  Administered 2017-10-07: 1 mg via INTRAVENOUS

## 2017-10-07 MED ORDER — FENTANYL CITRATE (PF) 100 MCG/2ML IJ SOLN
INTRAMUSCULAR | Status: AC
  Filled 2017-10-07: qty 2

## 2017-10-07 MED ORDER — HEPARIN (PORCINE) IN NACL 2-0.9 UNIT/ML-% IJ SOLN
INTRAMUSCULAR | Status: AC
  Filled 2017-10-07: qty 500

## 2017-10-07 MED ORDER — ASPIRIN 81 MG PO CHEW: 81.0000 mg | CHEWABLE_TABLET | ORAL | Status: DC

## 2017-10-07 MED ORDER — ACETAMINOPHEN 325 MG PO TABS: 650.0000 mg | ORAL_TABLET | ORAL | Status: DC | PRN

## 2017-10-07 MED ORDER — ATORVASTATIN CALCIUM 40 MG PO TABS: 40.0000 mg | ORAL_TABLET | Freq: Every day | ORAL | 0 refills | Status: DC

## 2017-10-07 SURGICAL SUPPLY — 10 items
CATH 5FR PIGTAIL DIAGNOSTIC (CATHETERS) IMPLANT
CATH INFINITI 5FR ANG PIGTAIL (CATHETERS) ×3 IMPLANT
CATH INFINITI 5FR JL4 (CATHETERS) ×3 IMPLANT
CATH INFINITI JR4 5F (CATHETERS) ×3 IMPLANT
DEVICE CLOSURE MYNXGRIP 5F (Vascular Products) ×3 IMPLANT
KIT MANI 3VAL PERCEP (MISCELLANEOUS) ×3 IMPLANT
NEEDLE PERC 18GX7CM (NEEDLE) ×3 IMPLANT
PACK CARDIAC CATH (CUSTOM PROCEDURE TRAY) ×3 IMPLANT
SHEATH AVANTI 5FR X 11CM (SHEATH) ×3 IMPLANT
WIRE EMERALD 3MM-J .035X150CM (WIRE) ×3 IMPLANT

## 2017-10-07 NOTE — Consult Note (Signed)
[facility]  CARDIOLOGY CONSULT NOTE  Patient ID: HAFSA LOHN MRN: [id number] DOB/AGE: 06/05/1933 83 y.o.  Admit date: 10/06/2017 Referring Physician Dr. Vianne Bulls Primary Physician Dr. Ouida Sills Primary Cardiologist Dr. Nehemiah Massed Reason for Consultation nstemi  HPI: Patient is an 83 year old female with history of chronic kidney disease, hypertension and hyperlipidemia carries a diagnosis of coronary artery disease who is admitted with chest discomfort. Pain has both typical and atypical features. It has been fairly constant yesterday. She has had intermittent chest pain for several years. Noninvasive workup has been unremarkable. Patient reports that she had a heart catheterization in the past with insignificant disease. Records are currently not available. Pain or claudication breathing however is fairly constant. EKG shows sinus rhythm at a rate of 64 with a left bundle branch block. Left bundle branch block appears to be chronic at least since February 2017 in new from February 2011. Patient's initial serum troponin was 0.09 and increased to a peak of 1.71 and 1.43 this morning. Renal function is normal. Patient is currently hemodynamically stable. As an outpatient she is currently being treated with losartan 100 mg daily, carvedilol 3.125 mg twice daily and pravastatin at 40 mg daily. She was given sublingual nitroglycerin and placed on a nitroglycerin patch which caused severe headache. She denies tobacco abuse.  Review of Systems  HENT: Negative.   Eyes: Negative.   Respiratory: Negative.   Cardiovascular: Positive for chest pain.  Gastrointestinal: Positive for heartburn.  Genitourinary: Negative.   Musculoskeletal: Negative.   Skin: Negative.   Neurological: Negative.   Endo/Heme/Allergies: Negative.   Psychiatric/Behavioral: Negative.     Past Medical History:  Diagnosis Date  . Arthritis   . CKD (chronic kidney disease)  stage 3, GFR 30-59 ml/min (HCC)   . COPD (chronic obstructive pulmonary disease) ([place])   . HLD (hyperlipidemia)   . HTN (hypertension)   . IBS (irritable bowel syndrome)   . Lymphoma ([place])   . Migraine     Family History  Problem Relation Age of Onset  . Colon cancer Mother     Social History   Social History  . Marital status: Widowed    Spouse name: N/A  . Number of children: N/A  . Years of education: N/A   Occupational History  . Not on file.   Social History Main Topics  . Smoking status: Never Smoker  . Smokeless tobacco: Never Used  . Alcohol use 0.0 oz/week     Comment: occassional  . Drug use: No  . Sexual activity: Not on file   Other Topics Concern  . Not on file   Social History Narrative   Lives at home by herself. Independent at baseline.    Past Surgical History:  Procedure Laterality Date  . ABDOMINAL HYSTERECTOMY    . APPENDECTOMY    . COLONOSCOPY       Prescriptions Prior to Admission  Medication Sig Dispense Refill Last Dose  . carvedilol (COREG) 3.125 MG tablet Take 3.125 mg by mouth 2 (two) times daily with a meal.   10/06/2017 at 0730  . losartan (COZAAR) 100 MG tablet Take 100 mg by mouth daily.   10/06/2017 at am  . omeprazole (PRILOSEC) 40 MG capsule Take 40 mg by mouth 2 (two) times daily.    10/06/2017 at am  . pravastatin (PRAVACHOL) 40 MG tablet Take 40 mg by mouth at bedtime.    Not Taking at pm    Physical Exam:  Blood pressure 122/61, pulse 67, temperature 98.3 F (36.8 C), temperature source Oral, resp. rate 17, height 5\' 1"  (1.549 m), weight 62.1 kg (137 lb), SpO2 96 %.   Wt Readings from Last 1 Encounters:  10/06/17 62.1 kg (137 lb)     General appearance: alert and cooperative Head: Normocephalic, without obvious abnormality, atraumatic Resp: clear to auscultation bilaterally Chest wall: no tenderness Cardio: regular rate and rhythm GI: soft, non-tender; bowel sounds normal; no masses,  no organomegaly Extremities:  extremities normal, atraumatic, no cyanosis or edema Pulses: 2+ and symmetric Neurologic: Grossly normal  Labs:   Lab Results  Component Value Date   WBC 8.8 10/07/2017   HGB 13.5 10/07/2017   HCT 38.4 10/07/2017   MCV 93.5 10/07/2017   PLT 263 10/07/2017    Recent Labs Lab 10/06/17 1340 10/07/17 0441  NA 132* 135  K 3.6 4.0  CL 96* 102  CO2 24 26  BUN 18 14  CREATININE 1.02* 0.87  CALCIUM 9.3 9.0  PROT 6.4*  --   BILITOT 1.1  --   ALKPHOS 35*  --   ALT 14  --   AST 22  --   GLUCOSE 164* 108*   Lab Results  Component Value Date   TROPONINI 1.43 ([place]) 10/07/2017      Radiology: Mild hyperinflation consistent with COPD. No CHF or airspace disease EKG: Sinus rhythm with left bundle branch block  ASSESSMENT AND PLAN:  83 year old female with apparent history of coronary artery disease per her report by cardiac catheterization several years ago. Records are currently not available. She presented with chest discomfort with both typical and atypical features. This is been present intermittently for the past several years but worsened yesterday. On presentation to the emergency room she had a chronic left bundle-branch block which is been present at least since 2017. Initial serum troponin was 0.09 with a subsequent value peaking at 1.7 101.48 this morning. Continues to have chest pain with both typical and atypical features. Given her risk factors for coronary disease, persistent chest pain and mildly elevated serum troponin, will need to proceed left cardiac catheterization to evaluate coronary anatomy. Patient has a chronic cough. Further recommendations after cardiac catheter is complete. Will defer nitrates secondary to headache.  We will continue with carvedilol, heparin, losartan and change his pravastatin to high intensity statin and atorvastatin 40 mg daily. Patient will need phase II cardiac rehabilitation. Signed: Teodoro Spray MD, [facility] 10/07/2017, 7:29 AM

## 2017-10-07 NOTE — Progress Notes (Signed)
[facility] [place] NAME: Sandra Delgado    MR#:  [id number]  DATE OF BIRTH:  [birth year]  SUBJECTIVE: Admitted for chest pain, cardiac catheter is showing only 60% blockage in the mid RCA. Medical management advised. Patient has no chest pain but complains of heartburn. Patient's daughter-in-law who is a nurse wants To stay longer but I spoke with Dr. Ubaldo Glassing and he suggested that patient can "discharged from cardiac standpoint.   CHIEF COMPLAINT:   Chief Complaint  Patient presents with  . Chest Pain    REVIEW OF SYSTEMS:   ROS CONSTITUTIONAL: No fever, fatigue or weakness.  EYES: No blurred or double vision.  EARS, NOSE, AND THROAT: No tinnitus or ear pain.  RESPIRATORY: No cough, shortness of breath, wheezing or hemoptysis.  CARDIOVASCULAR: No chest pain, orthopnea, edema.  GASTROINTESTINAL: No nausea, vomiting, diarrhea or abdominal pain.  GENITOURINARY: No dysuria, hematuria.  ENDOCRINE: No polyuria, nocturia,  HEMATOLOGY: No anemia, easy bruising or bleeding SKIN: No rash or lesion. MUSCULOSKELETAL: No joint pain or arthritis.   NEUROLOGIC: No tingling, numbness, weakness.  PSYCHIATRY: No anxiety or depression.   DRUG ALLERGIES:   Allergies  Allergen Reactions  . Ace Inhibitors Other (See Comments)    Reaction: unknown  . Amoxicillin Diarrhea    Has patient had a PCN reaction causing immediate rash, facial/tongue/throat swelling, SOB or lightheadedness with hypotension: No Has patient had a PCN reaction causing severe rash involving mucus membranes or skin necrosis: No Has patient had a PCN reaction that required hospitalization No Has patient had a PCN reaction occurring within the last 10 years: Yes If all of the above answers are "NO", then may proceed with Cephalosporin use.   . Atorvastatin Other (See Comments)    Reaction: muscle pain  . Ezetimibe Other (See Comments)    Reaction: fatigue  . Niacin And Related  Other (See Comments)    Reaction: flushing    VITALS:  Blood pressure (!) 133/59, pulse 71, temperature 98.1 F (36.7 C), temperature source Oral, resp. rate 18, height 5\' 1"  (1.549 m), weight 62.1 kg (137 lb), SpO2 96 %.  PHYSICAL EXAMINATION:  GENERAL:  83 y.o.-year-old patient lying in the bed with no acute distress.  EYES: Pupils equal, round, reactive to light and accommodation. No scleral icterus. Extraocular muscles intact.  HEENT: Head atraumatic, normocephalic. Oropharynx and nasopharynx clear.  NECK:  Supple, no jugular venous distention. No thyroid enlargement, no tenderness.  LUNGS: Normal breath sounds bilaterally, no wheezing, rales,rhonchi or crepitation. No use of accessory muscles of respiration.  CARDIOVASCULAR: S1, S2 normal. No murmurs, rubs, or gallops.  ABDOMEN: Soft, nontender, nondistended. Bowel sounds present. No organomegaly or mass.  EXTREMITIES: No pedal edema, cyanosis, or clubbing.  NEUROLOGIC: Cranial nerves II through XII are intact. Muscle strength 5/5 in all extremities. Sensation intact. Gait not checked.  PSYCHIATRIC: The patient is alert and oriented x 3.  SKIN: No obvious rash, lesion, or ulcer.    LABORATORY PANEL:   CBC  Recent Labs Lab 10/07/17 0441  WBC 8.8  HGB 13.5  HCT 38.4  PLT 263   ------------------------------------------------------------------------------------------------------------------  Chemistries   Recent Labs Lab 10/06/17 1340 10/07/17 0441  NA 132* 135  K 3.6 4.0  CL 96* 102  CO2 24 26  GLUCOSE 164* 108*  BUN 18 14  CREATININE 1.02* 0.87  CALCIUM 9.3 9.0  AST 22  --   ALT 14  --   ALKPHOS 35*  --  BILITOT 1.1  --    ------------------------------------------------------------------------------------------------------------------  Cardiac Enzymes  Recent Labs Lab 10/07/17 0441  TROPONINI 1.43*    ------------------------------------------------------------------------------------------------------------------  RADIOLOGY:  Dg Chest 2 View  Result Date: 10/06/2017 CLINICAL DATA:  Onset of chest pain with nausea and noon today. History of lymphoma, COPD. EXAM: CHEST  2 VIEW COMPARISON:  Chest x-ray of February 05, 2016 FINDINGS: The lungs are hyperinflated. There is no focal infiltrate. The heart is top-normal in size. The pulmonary vascularity is normal. There is calcification in the wall of the aortic arch. There is no pleural effusion or pneumothorax. The observed bony thorax is unremarkable. IMPRESSION: Mild hyperinflation consistent with COPD. Top-normal cardiac size. There is no pneumonia nor CHF. Thoracic aortic atherosclerosis. Electronically Signed   By: David  Martinique M.D.   On: 10/06/2017 14:18    EKG:   Orders placed or performed during the hospital encounter of 10/06/17  . ED EKG within 10 minutes  . ED EKG within 10 minutes    ASSESSMENT AND PLAN:   Chest pain, NSTEMI elevation MI, status post cardiac catheter, cardiac catheter showed 60% stenosis of proximal RCA. Medical management is advised.asa, beta blockers,statins patient is stable for discharge later today..#2 GERD: Continue PPIs discharge instructions are in the computer, patient can follow up with Dr. Nehemiah Massed is an outpatient. Patient ambulated well at home nurses station   . discussed with Care Management/Social Workerr. Management plans discussed with the patient, family and they are in agreement.  CODE STATUS: full  TOTAL TIME TAKING CARE OF THIS PATIENT: 35 minutes.    likely discharge today.  Epifanio Lesches M.D on 10/07/2017 at 2:42 PM  Between 7am to 6pm - Pager - [phone]  After 6pm go to [email or web address] - password [email or web address] [facility]  [facility]  Office  [phone]  CC: Primary care physician; Kirk Ruths, MD   Note: This dictation was prepared with Dragon  dictation along with smaller phrase technology. Any transcriptional errors that result from this process are unintentional.

## 2017-10-07 NOTE — Progress Notes (Signed)
Patient referred to Cardiac Rehab with Dx of NSTEMI.  Patient presented to ED with atypical chest pain and elevated troponin.  Patient underwent cardiac cath today with following findings:  Left Main, Left Circumflex, Left Anterior Descending Artery with only luminal irregularity.  Proximal RCA with 50-60% stenosis, which was felt not to be significant per Dr. Ubaldo Glassing.  LV function was normal.  Plan:  Medical Management to include statin at high intensity dose, carvedilol, losartan, and ASA.    Patient in bed with HOB elevated.  Sons at bedside.  Reviewed patient's presenting symptoms, elevated troponin, and cath results.  "Boucing Back from Heart Attack" Booklet given and reviewed with patient/sons. Patient informed this RN that she had a cardiac cath approximately 5 years ago and Dr. Nehemiah Massed informed her she had CAD.  Patient nor sons remember the location of the CAD or the percentage.  Patient's primary cardiologist is Dr. Nehemiah Massed.   Risk factor modifications discussed.  Patient does not smoke and denies being a diabetic.  Discussed with patient the importance of controlling blood pressure and cholesterol, maintaining a healthy weight, following heart healthy diet and exercise. Reviewed information on heart healthy diet.  Information about food selections/preparations for heart healthy diet provided to patient  Cardiac medications - classification, mechanism of action, rationale for taking - were explained to patient. This RN asked patient if she had ever been prescribed NTG?  Patient stated she and Dr. Nehemiah Massed had talked about it at one time, but she did not like the side effects of headache and low blood pressure.  Patient informed this RN that she does not like to take a lot of medications. Exercise discussed.  Explained to patient she had been referred to Cardiac Rehab by Dr. Ubaldo Glassing.  Overview along with benefits  of the Cardiac Rehab program explained to patient and sons.  Patient stated she is 83 years old  and has been exercising three times a week at the [facility] in [place], [place].  Patient likes going there because she has many friends there and it is not too far from her home.  Patient also does not like to be tied down to a schedule.  I provided patient with Cardiac Rehab brochure.  Patient would like time to think about Cardiac Rehab and will discuss with Dr. Nehemiah Massed.  We agreed that Cardiac Rehab department would call her in a couple of weeks to see what she has decided about participating.    Information on [facility] provided and given to patient.    Patient and sons thank me for stopping in to provide this information.    Roanna Epley, RN, BSN, [facility] Cardiovascular and Pulmonary Nurse Navigator

## 2017-10-07 NOTE — Progress Notes (Signed)
ANTICOAGULATION CONSULT NOTE - Initial Consult  Pharmacy Consult for Heparin Drip Dosing and Monitoring  Indication: chest pain/ACS  Allergies  Allergen Reactions  . Ace Inhibitors Other (See Comments)    Reaction: unknown  . Amoxicillin Diarrhea    Has patient had a PCN reaction causing immediate rash, facial/tongue/throat swelling, SOB or lightheadedness with hypotension: No Has patient had a PCN reaction causing severe rash involving mucus membranes or skin necrosis: No Has patient had a PCN reaction that required hospitalization No Has patient had a PCN reaction occurring within the last 10 years: Yes If all of the above answers are "NO", then may proceed with Cephalosporin use.   . Atorvastatin Other (See Comments)    Reaction: muscle pain  . Ezetimibe Other (See Comments)    Reaction: fatigue  . Niacin And Related Other (See Comments)    Reaction: flushing    Patient Measurements: Height: 5\' 1"  (154.9 cm) Weight: 137 lb (62.1 kg) IBW/kg (Calculated) : 47.8  Vital Signs: Temp: 98.5 F (36.9 C) (10/16 1924) Temp Source: Oral (10/16 1924) BP: 138/71 (10/16 2330) Pulse Rate: 78 (10/16 2330)  Labs:  Recent Labs  10/06/17 1340 10/06/17 1350 10/06/17 1700 10/06/17 2223 10/07/17 0009  HGB 13.8  --   --   --   --   HCT 39.8  --   --   --   --   PLT 303  --   --   --   --   APTT  --  <24*  --   --   --   LABPROT  --  12.7  --   --   --   INR  --  0.96  --   --   --   HEPARINUNFRC  --   --   --   --  0.81*  CREATININE 1.02*  --   --   --   --   TROPONINI 0.09*  --  1.29* 1.71*  --     Estimated Creatinine Clearance: 35.9 mL/min (A) (by C-G formula based on SCr of 1.02 mg/dL (H)).   Medical History: Past Medical History:  Diagnosis Date  . Arthritis   . CKD (chronic kidney disease) stage 3, GFR 30-59 ml/min (HCC)   . COPD (chronic obstructive pulmonary disease) ([place])   . HLD (hyperlipidemia)   . HTN (hypertension)   . IBS (irritable bowel syndrome)   .  Lymphoma ([place])   . Migraine    Assessment: Pharmacy consulted for heparin drip dosing and monitoring in 83 yo female admitted with NSTEMI.  Goal of Therapy:  Heparin level 0.3-0.7 units/ml Monitor platelets by anticoagulation protocol: Yes   Plan:  Baseline labs ordered  Give 3800 units bolus x 1 Start heparin infusion at 750 units/hr Check anti-Xa level in 8 hours and daily while on heparin Continue to monitor H&H and platelets   10/17 0000 heparin level 0.81. Decrease rate to 650 units/hr. Recheck heparin level in 8 hours.  Eloise Harman, PharmD, BCPS Clinical Pharmacist 10/07/2017 2:04 AM

## 2017-10-07 NOTE — Progress Notes (Signed)
Sandra Delgado to be D/C'd Home per MD order.  Discussed prescriptions and follow up appointments with the patient. Prescriptions sent to [facility] pharmacy, medication list explained in detail. Pt verbalized understanding. Daughter in law at bedside.  Allergies as of 10/07/2017      Reactions   Ace Inhibitors Other (See Comments)   Reaction: unknown   Amoxicillin Diarrhea   Has patient had a PCN reaction causing immediate rash, facial/tongue/throat swelling, SOB or lightheadedness with hypotension: No Has patient had a PCN reaction causing severe rash involving mucus membranes or skin necrosis: No Has patient had a PCN reaction that required hospitalization No Has patient had a PCN reaction occurring within the last 10 years: Yes If all of the above answers are "NO", then may proceed with Cephalosporin use.   Atorvastatin Other (See Comments)   Reaction: muscle pain   Ezetimibe Other (See Comments)   Reaction: fatigue   Niacin And Related Other (See Comments)   Reaction: flushing      Medication List    STOP taking these medications   pravastatin 40 MG tablet Commonly known as:  PRAVACHOL     TAKE these medications   aspirin 81 MG EC tablet Take 1 tablet (81 mg total) by mouth daily.   atorvastatin 40 MG tablet Commonly known as:  LIPITOR Take 1 tablet (40 mg total) by mouth daily at 6 PM.   carvedilol 3.125 MG tablet Commonly known as:  COREG Take 3.125 mg by mouth 2 (two) times daily with a meal.   losartan 100 MG tablet Commonly known as:  COZAAR Take 100 mg by mouth daily.   omeprazole 40 MG capsule Commonly known as:  PRILOSEC Take 40 mg by mouth 2 (two) times daily.       Vitals:   10/07/17 1000 10/07/17 1021  BP: 115/66 (!) 133/59  Pulse: 75 71  Resp: 18 18  Temp:  98.1 F (36.7 C)  SpO2: 93% 96%    Skin clean, dry and intact without evidence of skin break down, no evidence of skin tears noted. IV catheter discontinued intact. Site without signs and  symptoms of complications. Dressing and pressure applied. Tele box removed and returned. Pt denies pain at this time. No complaints noted.  An After Visit Summary was printed and given to the patient. Patient escorted via [place], and D/C home via private auto.  Rolley Sims

## 2017-10-07 NOTE — Progress Notes (Signed)
ANTICOAGULATION CONSULT NOTE - Initial Consult  Pharmacy Consult for Heparin Drip Dosing and Monitoring  Indication: chest pain/ACS  Allergies  Allergen Reactions  . Ace Inhibitors Other (See Comments)    Reaction: unknown  . Amoxicillin Diarrhea    Has patient had a PCN reaction causing immediate rash, facial/tongue/throat swelling, SOB or lightheadedness with hypotension: No Has patient had a PCN reaction causing severe rash involving mucus membranes or skin necrosis: No Has patient had a PCN reaction that required hospitalization No Has patient had a PCN reaction occurring within the last 10 years: Yes If all of the above answers are "NO", then may proceed with Cephalosporin use.   . Atorvastatin Other (See Comments)    Reaction: muscle pain  . Ezetimibe Other (See Comments)    Reaction: fatigue  . Niacin And Related Other (See Comments)    Reaction: flushing    Patient Measurements: Height: 5\' 1"  (154.9 cm) Weight: 137 lb (62.1 kg) IBW/kg (Calculated) : 47.8  Vital Signs: Temp: 98.1 F (36.7 C) (10/17 1021) Temp Source: Oral (10/17 1021) BP: 133/59 (10/17 1021) Pulse Rate: 71 (10/17 1021)  Labs:  Recent Labs  10/06/17 1340 10/06/17 1350 10/06/17 1700 10/06/17 2223 10/07/17 0009 10/07/17 0441  HGB 13.8  --   --   --   --  13.5  HCT 39.8  --   --   --   --  38.4  PLT 303  --   --   --   --  263  APTT  --  <24*  --   --   --   --   LABPROT  --  12.7  --   --   --   --   INR  --  0.96  --   --   --   --   HEPARINUNFRC  --   --   --   --  0.81*  --   CREATININE 1.02*  --   --   --   --  0.87  TROPONINI 0.09*  --  1.29* 1.71*  --  1.43*    Estimated Creatinine Clearance: 42.1 mL/min (by C-G formula based on SCr of 0.87 mg/dL).   Medical History: Past Medical History:  Diagnosis Date  . Arthritis   . CKD (chronic kidney disease) stage 3, GFR 30-59 ml/min (HCC)   . COPD (chronic obstructive pulmonary disease) ([place])   . HLD (hyperlipidemia)   . HTN  (hypertension)   . IBS (irritable bowel syndrome)   . Lymphoma ([place])   . Migraine    Assessment: Pharmacy consulted for heparin drip dosing and monitoring in 83 yo female admitted with NSTEMI.  Goal of Therapy:  Heparin level 0.3-0.7 units/ml Monitor platelets by anticoagulation protocol: Yes   Plan:  Baseline labs ordered  Give 3800 units bolus x 1 Start heparin infusion at 750 units/hr Check anti-Xa level in 8 hours and daily while on heparin Continue to monitor H&H and platelets   10/17 0000 heparin level 0.81. Decrease rate to 650 units/hr. Recheck heparin level in 8 hours.  10/17 10:53 RN from specials says patient is expected to discharge today and will be managed medically. She discontinued the heparin infusion. I will discontinue the heparin per pharmacy protocol.   Laural Benes, PharmD, BCPS Clinical Pharmacist 10/07/2017 10:52 AM

## 2017-10-07 NOTE — Progress Notes (Signed)
Cardiac cath completed today without complication. Lm, lad and lcx with only luminal irregularity. Proximal rca with 50-60% stenosis. Not felt to be significant. LV normal. Medical management to include statin at high intensity dose, carvedilol, losartan, asa. Ambulate and consider discharge if stable with out patient follow up with Dr. Nehemiah Massed

## 2017-10-07 NOTE — Care Management CC44 (Signed)
Condition Code 44 Documentation Completed  Patient Details  Name: Sandra Delgado MRN: [id number] Date of Birth: Oct 14, 1933   Condition Code 44 given:  Yes Patient signature on Condition Code 44 notice:  Yes Documentation of 2 MD's agreement:  Yes Code 44 added to claim:  Yes    Shelbie Ammons, RN 10/07/2017, 3:59 PM

## 2017-10-07 NOTE — Progress Notes (Signed)
Pt ambulated around nurses station and tolerated well. No c/o of chest pain, no sob. Will continue to monitor.

## 2017-10-08 NOTE — Discharge Summary (Signed)
Sandra Delgado, is a 83 y.o. female  DOB 08/02/1933  MRN [id number].  Admission date:  10/06/2017  Admitting Physician  Gladstone Lighter, MD  Discharge Date:  10/07/2017   Primary MD  Kirk Ruths, MD  Recommendations for primary care physician for things to follow:   Follow-up with PCP in one week  follow up with Dr. Nehemiah Massed in 1 week   Admission Diagnosis  Atypical chest pain [R07.89] Elevated troponin [R74.8]   Discharge Diagnosis  Atypical chest pain [R07.89] Elevated troponin [R74.8]    Active Problems:   NSTEMI (non-ST elevated myocardial infarction) [facility])      Past Medical History:  Diagnosis Date  . Arthritis   . CKD (chronic kidney disease) stage 3, GFR 30-59 ml/min (HCC)   . COPD (chronic obstructive pulmonary disease) ([place])   . HLD (hyperlipidemia)   . HTN (hypertension)   . IBS (irritable bowel syndrome)   . Lymphoma ([place])   . Migraine     Past Surgical History:  Procedure Laterality Date  . ABDOMINAL HYSTERECTOMY    . APPENDECTOMY    . COLONOSCOPY    . LEFT HEART CATH AND CORONARY ANGIOGRAPHY N/A 10/07/2017   Procedure: LEFT HEART CATH AND CORONARY ANGIOGRAPHY;  Surgeon: Teodoro Spray, MD;  Location: [facility];  Service: Cardiovascular;  Laterality: N/A;       History of present illness and  Hospital Course:     Kindly see H&P for history of present illness and admission details, please review complete Labs, Consult reports and Test reports for all details in brief  HPI  from the history and physical done on the day of admission 83 year old female patient admitted because ofchest pain, patient has intermittent chest pain for years.H and had left-sided chest pain, burning in stomach.AG showed a sinus rhythm at rate of 64 beats with left bundle branch block which  appears chronic since 2017 of February. Troponin 0.09. And peaked up to 1.71 and then 1.43. Admitted to hospital on telemetry for non-ST elevation MI. Hospital Course   #1 chest pain with elevated troponins consistent with a non-ST elevation MI. Patient is taken to cardiac catheter by Dr. Margie Ege catheter showed proximal RCA lesion 60%, medical management advised.patient received aspirin, beta blockers, high intensity statins, discharged home in stable condition.before discharge patient ambulated around nurses station without any chest pain. Vitals stayed stable. Discussed with  with Dr. Ubaldo Glassing before discharging the patient who agreed that patient can be discharged today. GERD;:Continue PPIs. Discharge Condition:    Follow UP  Follow-up Information    Kirk Ruths, MD. Go on 10/14/2017.   Specialty:  Internal Medicine Why:  at 3:15pm Contact information: [address] [place] [address] [phone]        Corey Skains, MD. Go on 10/21/2017.   Specialty:  Cardiology Why:  at 9:45am Contact information: [address] [facility] [place] [address] [phone]             Discharge Instructions  and  Discharge Medications    Discharge Instructions    AMB Referral to Cardiac Rehabilitation - Phase II    Complete by:  As directed    Diagnosis:  NSTEMI     Allergies as of 10/07/2017      Reactions   Ace Inhibitors Other (See Comments)   Reaction: unknown   Amoxicillin Diarrhea   Has patient had a PCN reaction causing immediate  rash, facial/tongue/throat swelling, SOB or lightheadedness with hypotension: No Has patient had a PCN reaction causing severe rash involving mucus membranes or skin necrosis: No Has patient had a PCN reaction that required hospitalization No Has patient had a PCN reaction occurring within the last 10 years: Yes If all of the above answers are "NO", then may  proceed with Cephalosporin use.   Atorvastatin Other (See Comments)   Reaction: muscle pain   Ezetimibe Other (See Comments)   Reaction: fatigue   Niacin And Related Other (See Comments)   Reaction: flushing      Medication List    STOP taking these medications   pravastatin 40 MG tablet Commonly known as:  PRAVACHOL     TAKE these medications   aspirin 81 MG EC tablet Take 1 tablet (81 mg total) by mouth daily.   atorvastatin 40 MG tablet Commonly known as:  LIPITOR Take 1 tablet (40 mg total) by mouth daily at 6 PM.   carvedilol 3.125 MG tablet Commonly known as:  COREG Take 3.125 mg by mouth 2 (two) times daily with a meal.   losartan 100 MG tablet Commonly known as:  COZAAR Take 100 mg by mouth daily.   omeprazole 40 MG capsule Commonly known as:  PRILOSEC Take 40 mg by mouth 2 (two) times daily.         Diet and Activity recommendation: See Discharge Instructions above   Consults obtained - cardiology   Major procedures and Radiology Reports - PLEASE review detailed and final reports for all details, in brief -      Dg Chest 2 View  Result Date: 10/06/2017 CLINICAL DATA:  Onset of chest pain with nausea and noon today. History of lymphoma, COPD. EXAM: CHEST  2 VIEW COMPARISON:  Chest x-ray of February 05, 2016 FINDINGS: The lungs are hyperinflated. There is no focal infiltrate. The heart is top-normal in size. The pulmonary vascularity is normal. There is calcification in the wall of the aortic arch. There is no pleural effusion or pneumothorax. The observed bony thorax is unremarkable. IMPRESSION: Mild hyperinflation consistent with COPD. Top-normal cardiac size. There is no pneumonia nor CHF. Thoracic aortic atherosclerosis. Electronically Signed   By: David  Martinique M.D.   On: 10/06/2017 14:18    Micro Results     No results found for this or any previous visit (from the past 240 hour(s)).     Today   Subjective:   Janiqua Sangiovanni today has  no headache,no chest abdominal pain,no new weakness tingling or numbness, feels much better wants to go home today.   Objective:   Blood pressure (!) 133/59, pulse 71, temperature 98.1 F (36.7 C), temperature source Oral, resp. rate 18, height 5\' 1"  (1.549 m), weight 62.1 kg (137 lb), SpO2 96 %.  No intake or output data in the 24 hours ending 10/08/17 1746  Exam Awake Alert, Oriented x 3, No new F.N deficits, Normal affect [place].AT,PERRAL Supple Neck,No JVD, No cervical lymphadenopathy appriciated.  Symmetrical Chest wall movement, Good air movement bilaterally, CTAB RRR,No Gallops,Rubs or new Murmurs, No Parasternal Heave +ve B.Sounds, Abd Soft, Non tender, No organomegaly appriciated, No rebound -guarding or rigidity. No Cyanosis, Clubbing or edema, No new Rash or bruise  Data Review   CBC w Diff:  Lab Results  Component Value Date   WBC 8.8 10/07/2017   HGB 13.5 10/07/2017   HGB 13.2 11/27/2014   HCT 38.4 10/07/2017   HCT 39.7 11/27/2014   PLT 263 10/07/2017  PLT 292 11/27/2014   LYMPHOPCT 41.8 11/27/2014   MONOPCT 8.5 11/27/2014   EOSPCT 0.9 11/27/2014   BASOPCT 1.0 11/27/2014    CMP:  Lab Results  Component Value Date   NA 135 10/07/2017   NA 135 (L) 11/27/2014   K 4.0 10/07/2017   K 4.0 11/27/2014   CL 102 10/07/2017   CL 99 11/27/2014   CO2 26 10/07/2017   CO2 31 11/27/2014   BUN 14 10/07/2017   BUN 16 11/27/2014   CREATININE 0.87 10/07/2017   CREATININE 1.12 11/27/2014   PROT 6.4 (L) 10/06/2017   PROT 7.0 04/11/2014   ALBUMIN 3.8 10/06/2017   ALBUMIN 3.3 (L) 04/11/2014   BILITOT 1.1 10/06/2017   BILITOT 0.4 04/11/2014   ALKPHOS 35 (L) 10/06/2017   ALKPHOS 60 04/11/2014   AST 22 10/06/2017   AST 13 (L) 04/11/2014   ALT 14 10/06/2017   ALT 16 04/11/2014  .   Total Time in preparing paper work, data evaluation and todays exam - 80 minutes  Rickey Farrier M.D on 10/07/2017 at 5:46 PM    Note: This dictation was prepared with Dragon  dictation along with smaller phrase technology. Any transcriptional errors that result from this process are unintentional.

## 2020-12-12 ENCOUNTER — Other Ambulatory Visit (HOSPITAL_COMMUNITY): Payer: Self-pay | Admitting: Internal Medicine

## 2020-12-12 ENCOUNTER — Other Ambulatory Visit: Payer: Self-pay | Admitting: Internal Medicine

## 2020-12-12 DIAGNOSIS — R1084 Generalized abdominal pain: Secondary | ICD-10-CM

## 2020-12-12 DIAGNOSIS — R634 Abnormal weight loss: Secondary | ICD-10-CM

## 2020-12-20 ENCOUNTER — Ambulatory Visit
Admission: RE | Admit: 2020-12-20 | Discharge: 2020-12-20 | Disposition: A | Discharge status: Home / Self Care | Payer: Medicare HMO | Source: Ambulatory Visit | Attending: Internal Medicine | Admitting: Internal Medicine

## 2020-12-20 ENCOUNTER — Other Ambulatory Visit: Payer: Self-pay

## 2020-12-20 DIAGNOSIS — R1084 Generalized abdominal pain: Secondary | ICD-10-CM | POA: Insufficient documentation

## 2020-12-20 DIAGNOSIS — R634 Abnormal weight loss: Secondary | ICD-10-CM | POA: Diagnosis present

## 2021-03-30 IMAGING — US US ABDOMEN COMPLETE
1 series · 14 of 25 positions shown · non-contrast
Comparison: CT abdomen pelvis dated 02/04/2017.

CLINICAL DATA: Generalized abdominal pain

EXAM:
ABDOMEN ULTRASOUND COMPLETE

[Series 1: us abdomen complete · 14 of 108 slices shown]
[im 1/108]
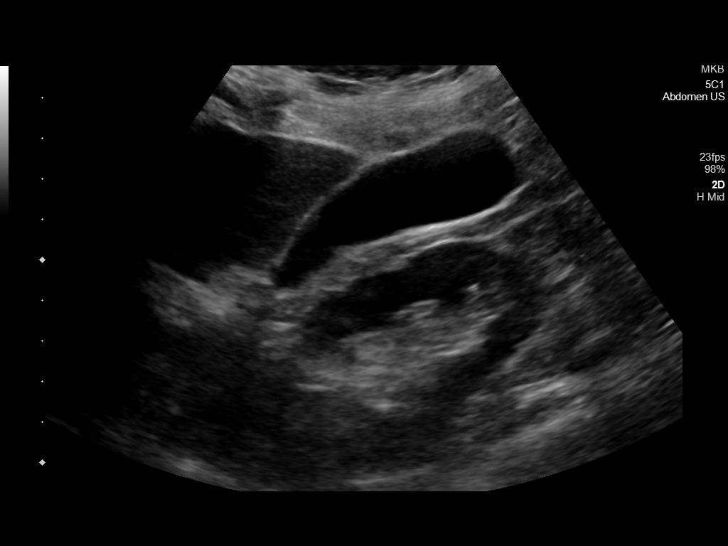
[im 9/108]
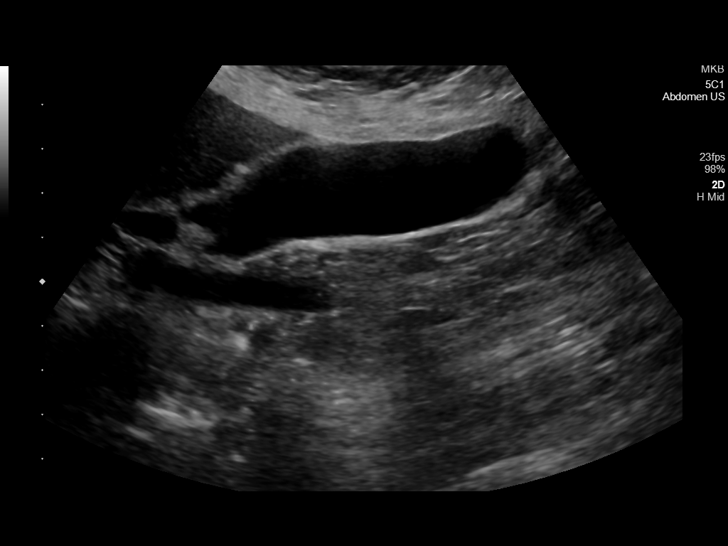
[im 18/108]
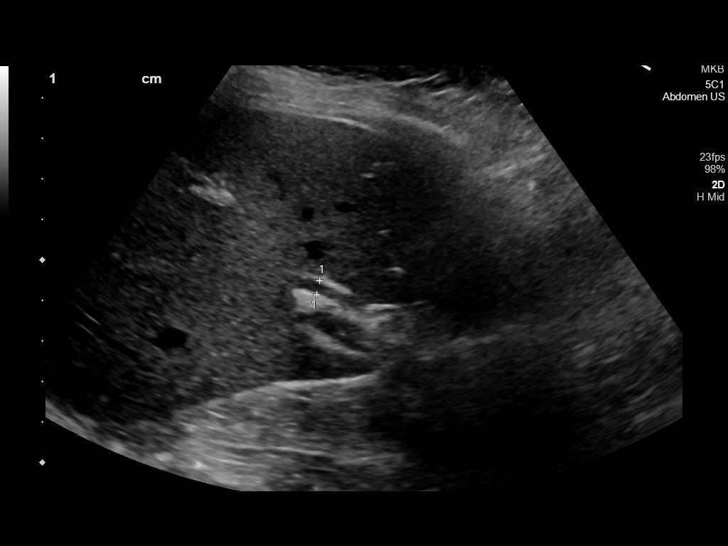
[im 27/108]
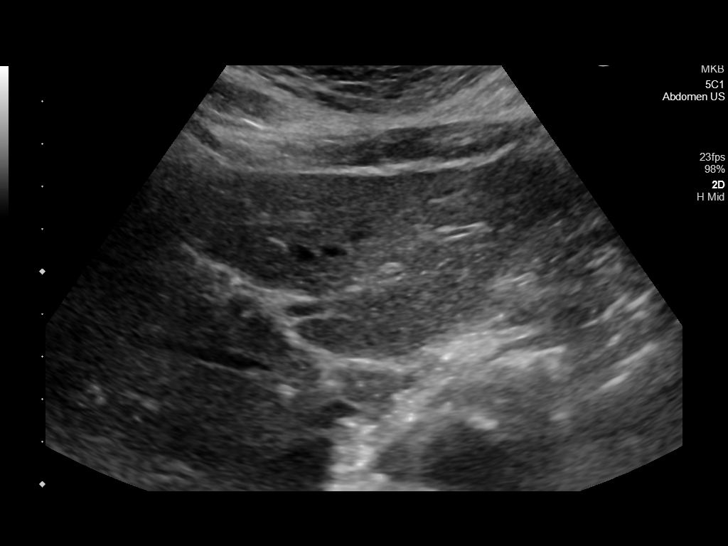
[im 36/108]
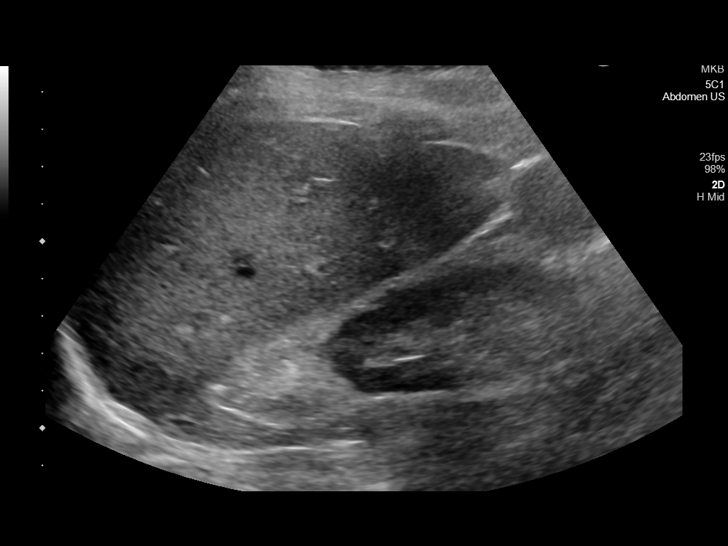
[im 41/108]
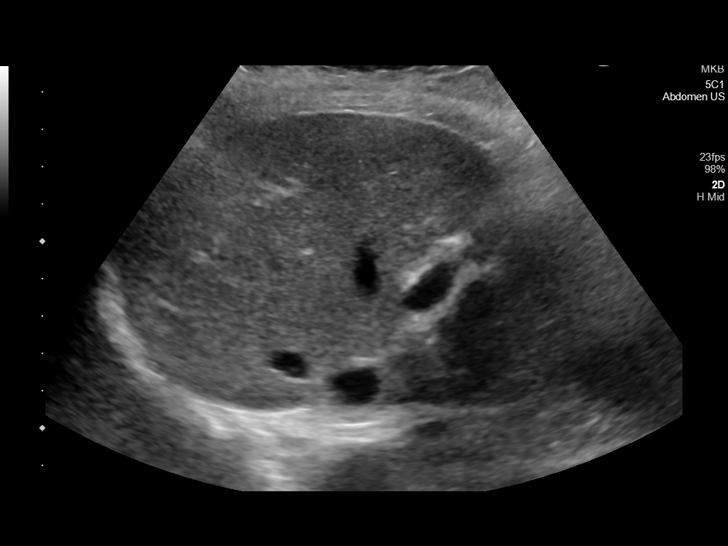
[im 50/108]
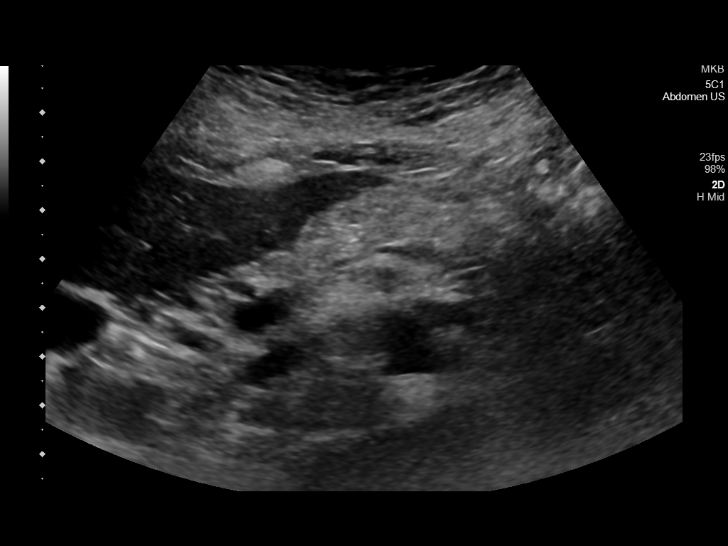
[im 58/108]
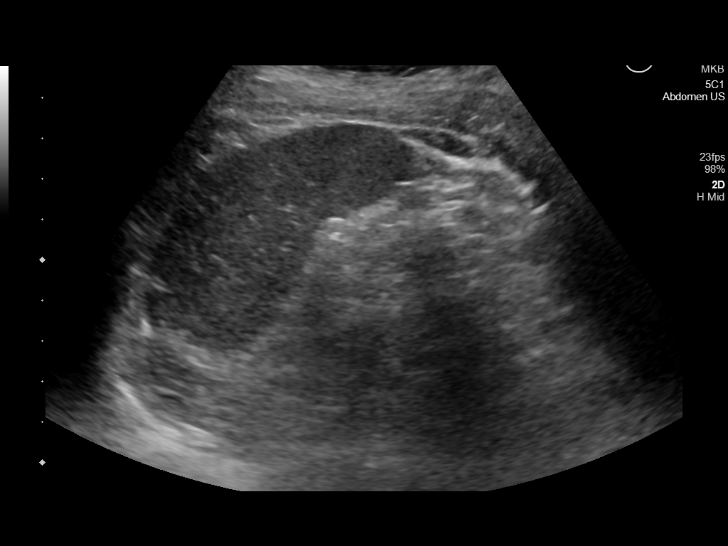
[im 67/108]
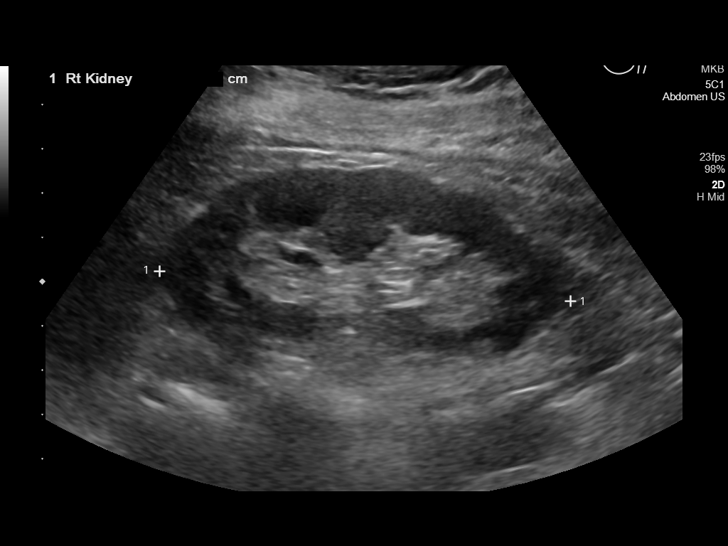
[im 72/108]
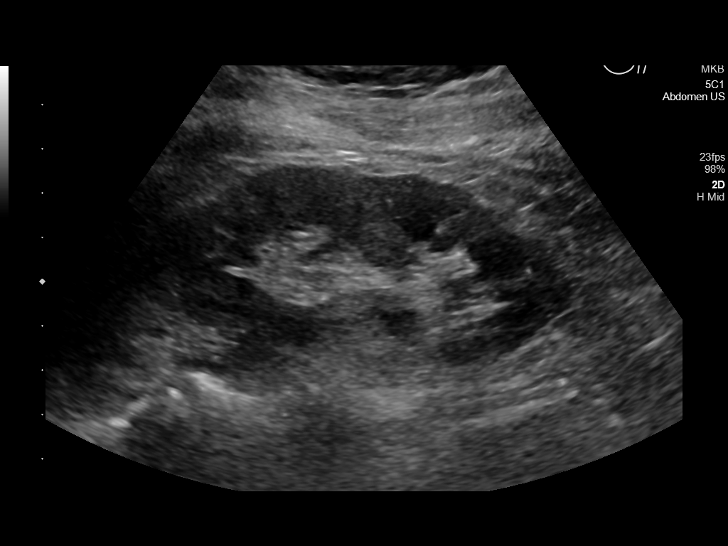
[im 81/108]
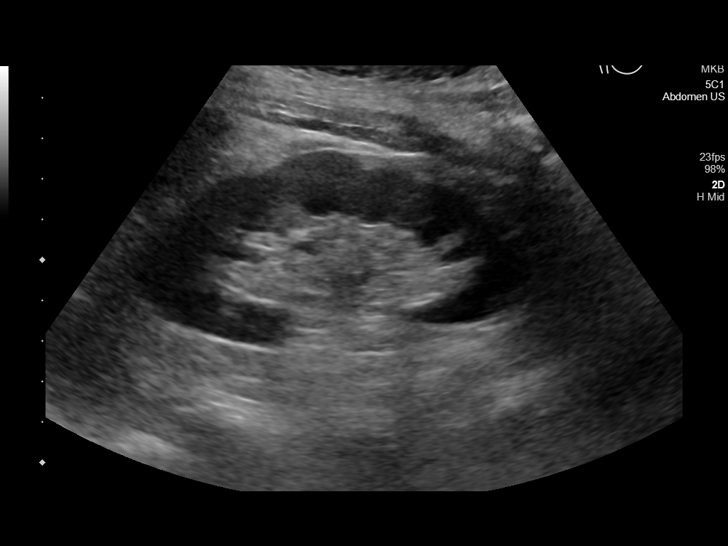
[im 90/108]
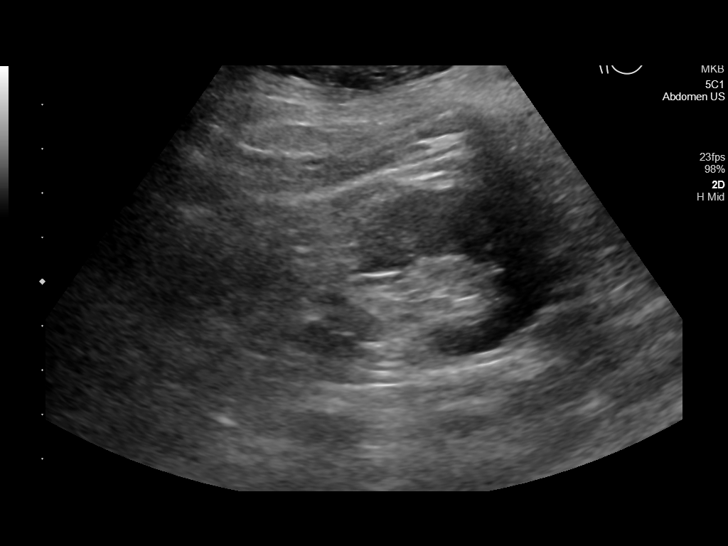
[im 99/108]
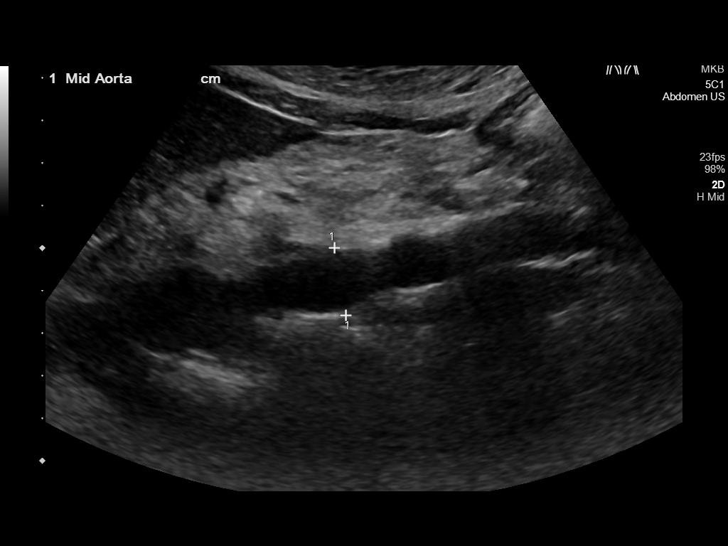
[im 108/108]
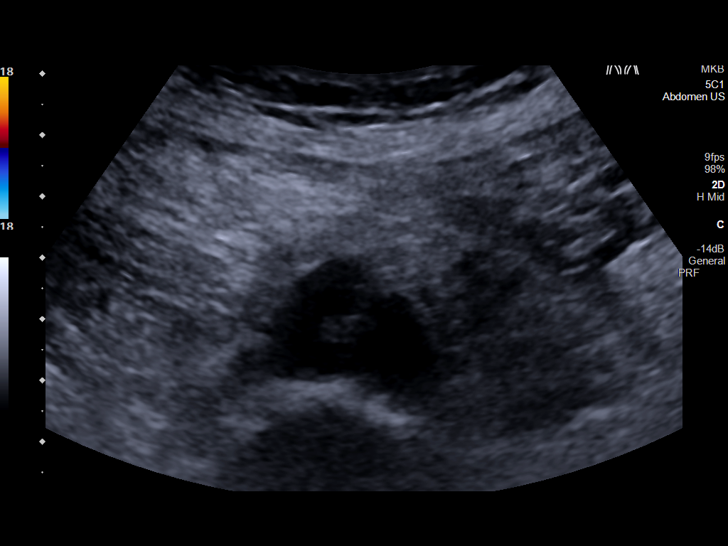

[14 of 25 positions shown; findings below may reference images not displayed]

FINDINGS: Gallbladder: No gallstones or wall thickening visualized. No
sonographic Murphy sign noted by sonographer.

Common bile duct: Diameter: 6 mm

Liver: No focal lesion identified. Within normal limits in
parenchymal echogenicity. Portal vein is patent on color Doppler
imaging with normal direction of blood flow towards the liver.

IVC: No abnormality visualized.

Pancreas: Visualized portion unremarkable.

Spleen: Size and appearance within normal limits.

Right Kidney: Length: 9.3 cm. Echogenicity within normal limits. No
mass or hydronephrosis visualized.

Left Kidney: Length: 9.7 cm. Echogenicity within normal limits. No
mass or hydronephrosis visualized.

Abdominal aorta: No aneurysm visualized.

Other findings: None.
IMPRESSION: No findings to explain abdominal pain.

## 2023-08-15 ENCOUNTER — Inpatient Hospital Stay
Admission: EM | Admit: 2023-08-15 | Discharge: 2023-08-22 | DRG: 988 | Disposition: A | Discharge status: Home Health | Payer: Medicare HMO | Attending: Internal Medicine | Admitting: Internal Medicine

## 2023-08-15 ENCOUNTER — Other Ambulatory Visit: Payer: Self-pay

## 2023-08-15 ENCOUNTER — Emergency Department: Payer: Medicare HMO

## 2023-08-15 DIAGNOSIS — G629 Polyneuropathy, unspecified: Secondary | ICD-10-CM | POA: Diagnosis present

## 2023-08-15 DIAGNOSIS — I447 Left bundle-branch block, unspecified: Secondary | ICD-10-CM | POA: Diagnosis present

## 2023-08-15 DIAGNOSIS — S41111A Laceration without foreign body of right upper arm, initial encounter: Secondary | ICD-10-CM | POA: Diagnosis present

## 2023-08-15 DIAGNOSIS — Z79899 Other long term (current) drug therapy: Secondary | ICD-10-CM

## 2023-08-15 DIAGNOSIS — S51811A Laceration without foreign body of right forearm, initial encounter: Secondary | ICD-10-CM | POA: Diagnosis present

## 2023-08-15 DIAGNOSIS — I951 Orthostatic hypotension: Secondary | ICD-10-CM | POA: Diagnosis present

## 2023-08-15 DIAGNOSIS — D62 Acute posthemorrhagic anemia: Secondary | ICD-10-CM | POA: Diagnosis not present

## 2023-08-15 DIAGNOSIS — R55 Syncope and collapse: Secondary | ICD-10-CM | POA: Diagnosis not present

## 2023-08-15 DIAGNOSIS — Z888 Allergy status to other drugs, medicaments and biological substances status: Secondary | ICD-10-CM

## 2023-08-15 DIAGNOSIS — E860 Dehydration: Secondary | ICD-10-CM | POA: Diagnosis present

## 2023-08-15 DIAGNOSIS — Z90710 Acquired absence of both cervix and uterus: Secondary | ICD-10-CM

## 2023-08-15 DIAGNOSIS — Z602 Problems related to living alone: Secondary | ICD-10-CM | POA: Diagnosis present

## 2023-08-15 DIAGNOSIS — N1831 Chronic kidney disease, stage 3a: Secondary | ICD-10-CM | POA: Diagnosis present

## 2023-08-15 DIAGNOSIS — Z8572 Personal history of non-Hodgkin lymphomas: Secondary | ICD-10-CM

## 2023-08-15 DIAGNOSIS — I214 Non-ST elevation (NSTEMI) myocardial infarction: Secondary | ICD-10-CM | POA: Diagnosis not present

## 2023-08-15 DIAGNOSIS — Z800 Family history of malignant neoplasm of digestive organs: Secondary | ICD-10-CM

## 2023-08-15 DIAGNOSIS — K219 Gastro-esophageal reflux disease without esophagitis: Secondary | ICD-10-CM | POA: Diagnosis present

## 2023-08-15 DIAGNOSIS — I10 Essential (primary) hypertension: Secondary | ICD-10-CM | POA: Diagnosis present

## 2023-08-15 DIAGNOSIS — N179 Acute kidney failure, unspecified: Secondary | ICD-10-CM | POA: Diagnosis present

## 2023-08-15 DIAGNOSIS — E785 Hyperlipidemia, unspecified: Secondary | ICD-10-CM | POA: Diagnosis present

## 2023-08-15 DIAGNOSIS — R4189 Other symptoms and signs involving cognitive functions and awareness: Secondary | ICD-10-CM | POA: Diagnosis present

## 2023-08-15 DIAGNOSIS — Z66 Do not resuscitate: Secondary | ICD-10-CM | POA: Diagnosis present

## 2023-08-15 DIAGNOSIS — W19XXXA Unspecified fall, initial encounter: Secondary | ICD-10-CM | POA: Diagnosis present

## 2023-08-15 DIAGNOSIS — Z880 Allergy status to penicillin: Secondary | ICD-10-CM

## 2023-08-15 DIAGNOSIS — R296 Repeated falls: Secondary | ICD-10-CM | POA: Diagnosis present

## 2023-08-15 DIAGNOSIS — M1990 Unspecified osteoarthritis, unspecified site: Secondary | ICD-10-CM | POA: Diagnosis present

## 2023-08-15 DIAGNOSIS — E876 Hypokalemia: Secondary | ICD-10-CM | POA: Diagnosis not present

## 2023-08-15 DIAGNOSIS — Z9049 Acquired absence of other specified parts of digestive tract: Secondary | ICD-10-CM

## 2023-08-15 DIAGNOSIS — Z7902 Long term (current) use of antithrombotics/antiplatelets: Secondary | ICD-10-CM

## 2023-08-15 DIAGNOSIS — Z6841 Body Mass Index (BMI) 40.0 and over, adult: Secondary | ICD-10-CM

## 2023-08-15 DIAGNOSIS — I129 Hypertensive chronic kidney disease with stage 1 through stage 4 chronic kidney disease, or unspecified chronic kidney disease: Secondary | ICD-10-CM | POA: Diagnosis present

## 2023-08-15 DIAGNOSIS — K589 Irritable bowel syndrome without diarrhea: Secondary | ICD-10-CM | POA: Diagnosis present

## 2023-08-15 DIAGNOSIS — J449 Chronic obstructive pulmonary disease, unspecified: Secondary | ICD-10-CM | POA: Diagnosis present

## 2023-08-15 DIAGNOSIS — Z7982 Long term (current) use of aspirin: Secondary | ICD-10-CM

## 2023-08-15 DIAGNOSIS — R42 Dizziness and giddiness: Secondary | ICD-10-CM

## 2023-08-15 DIAGNOSIS — N390 Urinary tract infection, site not specified: Secondary | ICD-10-CM

## 2023-08-15 DIAGNOSIS — S51819A Laceration without foreign body of unspecified forearm, initial encounter: Secondary | ICD-10-CM

## 2023-08-15 NOTE — ED Triage Notes (Signed)
Pt fell at home in her bathroom approx 30 mins ago. Pt unsure what caused fall but she hit her right wrist on her bathroom sink. Pt has full thickness laceration to right wrist/forearm. Pt denies LOC

## 2023-08-15 NOTE — ED Provider Notes (Signed)
[facility] Provider Note    Event Date/Time   First MD Initiated Contact with Patient 08/15/23 2350     (approximate)   History   Fall   HPI  Sandra Delgado is a 89 y.o. female brought to the ED from home via EMS status post fall with right forearm laceration.  Patient with a history of COPD, CKD, hypertension on Plavix.  Unsure why she fell, states she might have felt dizzy.  Struck her right forearm on the bathroom sink.  Tetanus is up-to-date.  Denies striking head or LOC but states she did fall last week striking her head but did not seek medical evaluation.  Denies vision changes, headache, neck pain, chest pain, shortness of breath, abdominal pain, nausea or vomiting.     Past Medical History   Past Medical History:  Diagnosis Date   Arthritis    CKD (chronic kidney disease) stage 3, GFR 30-59 ml/min (HCC)    COPD (chronic obstructive pulmonary disease) (HCC)    HLD (hyperlipidemia)    HTN (hypertension)    IBS (irritable bowel syndrome)    Lymphoma (HCC)    Migraine      Active Problem List   Patient Active Problem List   Diagnosis Date Noted   NSTEMI (non-ST elevated myocardial infarction) (HCC) 10/06/2017   COPD exacerbation (HCC) 02/06/2016   HLD (hyperlipidemia) 02/06/2016   HTN (hypertension) 02/06/2016   GERD (gastroesophageal reflux disease) 02/06/2016     Past Surgical History   Past Surgical History:  Procedure Laterality Date   ABDOMINAL HYSTERECTOMY     APPENDECTOMY     COLONOSCOPY     LEFT HEART CATH AND CORONARY ANGIOGRAPHY N/A 10/07/2017   Procedure: LEFT HEART CATH AND CORONARY ANGIOGRAPHY;  Surgeon: Dalia Heading, MD;  Location: [facility] INVASIVE CV LAB;  Service: Cardiovascular;  Laterality: N/A;     Home Medications   Prior to Admission medications   Medication Sig Start Date End Date Taking? Authorizing Provider  aspirin EC 81 MG EC tablet Take 1 tablet (81 mg total) by mouth daily. 10/07/17   Katha Hamming, MD  atorvastatin (LIPITOR) 40 MG tablet Take 1 tablet (40 mg total) by mouth daily at 6 PM. 10/07/17   Katha Hamming, MD  carvedilol (COREG) 3.125 MG tablet Take 3.125 mg by mouth 2 (two) times daily with a meal.    [provider]  losartan (COZAAR) 100 MG tablet Take 100 mg by mouth daily.    [provider]  omeprazole (PRILOSEC) 40 MG capsule Take 40 mg by mouth 2 (two) times daily.     [provider]     Allergies  Ace inhibitors, Amoxicillin, Atorvastatin, Ezetimibe, and Niacin and related   Family History   Family History  Problem Relation Age of Onset   Colon cancer Mother      Physical Exam  Triage Vital Signs: ED Triage Vitals  Encounter Vitals Group     BP 08/15/23 2348 (!) 175/70     Systolic BP Percentile --      Diastolic BP Percentile --      Pulse Rate 08/15/23 2348 (!) 57     Resp 08/15/23 2348 17     Temp 08/15/23 2349 98.4 F (36.9 C)     Temp Source 08/15/23 2349 Oral     SpO2 08/15/23 2348 99 %     Weight 08/15/23 2350 240 lb 11.9 oz (109.2 kg)     Height 08/15/23 2350  5\' 1"  (1.549 m)     Head Circumference --      Peak Flow --      Pain Score 08/15/23 2349 6     Pain Loc --      Pain Education --      Exclude from Growth Chart --     Updated Vital Signs: BP (!) 159/70   Pulse 60   Temp 98.4 F (36.9 C) (Oral)   Resp 15   Ht 5\' 1"  (1.549 m)   Wt 109.2 kg   SpO2 100%   BMI 45.49 kg/m    General: Awake, no distress.  CV:  Good peripheral perfusion.  Resp:  Normal effort.  Abd:  No distention.  Other:  Right forearm: Large skin tear, unable to be sutured.  2+ radial pulse.  Brisk, less than 5-second cap refill.  5/5 motor strength and sensation.   ED Results / Procedures / Treatments  Labs (all labs ordered are listed, but only abnormal results are displayed) Labs Reviewed  COMPREHENSIVE METABOLIC PANEL - Abnormal; Notable for the following components:      Result Value   Glucose, Bld  129 (*)    Creatinine, Ser 1.40 (*)    Calcium 8.8 (*)    GFR, Estimated 36 (*)    All other components within normal limits  URINALYSIS, ROUTINE W REFLEX MICROSCOPIC - Abnormal; Notable for the following components:   Color, Urine YELLOW (*)    APPearance CLEAR (*)    Leukocytes,Ua TRACE (*)    All other components within normal limits  TROPONIN I (HIGH SENSITIVITY) - Abnormal; Notable for the following components:   Troponin I (High Sensitivity) 25 (*)    All other components within normal limits  TROPONIN I (HIGH SENSITIVITY) - Abnormal; Notable for the following components:   Troponin I (High Sensitivity) 129 (*)    All other components within normal limits  CBC WITH DIFFERENTIAL/PLATELET     EKG  ED ECG REPORT I, Cyree Chuong J, the attending physician, personally viewed and interpreted this ECG.   Date: 08/16/2023  EKG Time: 2350  Rate: 58  Rhythm: sinus bradycardia  Axis: Normal  Intervals: Left bundle branch block  ST&T Change: Nonspecific  ED ECG REPORT I, Eureka Valdes J, the attending physician, personally viewed and interpreted this ECG.   Date: 08/16/2023  EKG Time: 0139  Rate: 118  Rhythm: sinus tachycardia  Axis: Normal  Intervals: Left bundle branch block  ST&T Change: Nonspecific  ED ECG REPORT I, Stoy Fenn J, the attending physician, personally viewed and interpreted this ECG.   Date: 08/16/2023  EKG Time: 0142  Rate: 57  Rhythm: sinus bradycardia  Axis: Normal  Intervals:left bundle branch block  ST&T Change: Nonspecific      RADIOLOGY I have independently visualized and interpreted patient's imaging studies as well as noted the radiology interpretation:  CT head: No ICH  Right forearm x-ray: No acute osseous injury  Chest x-ray: No acute cardiopulmonary process  Pelvis x-ray: No acute osseous injury  Official radiology report(s): DG Forearm Right  Result Date: 08/16/2023 CLINICAL DATA:  Fall, laceration to right forearm EXAM: RIGHT  FOREARM - 2 VIEW COMPARISON:  None Available. FINDINGS: Soft tissue swelling along the posterior aspect of the forearm. No radiopaque foreign bodies. No acute bony abnormality. Specifically, no fracture, subluxation, or dislocation. IMPRESSION: Posterior soft tissue laceration and swelling. No acute bony abnormality. Electronically Signed   By: Charlett Nose M.D.   On: 08/16/2023 01:02   CT  Head Wo Contrast  Result Date: 08/16/2023 CLINICAL DATA:  Fall.  Head trauma, minor (Age >= 65y) EXAM: CT HEAD WITHOUT CONTRAST TECHNIQUE: Contiguous axial images were obtained from the base of the skull through the vertex without intravenous contrast. RADIATION DOSE REDUCTION: This exam was performed according to the departmental dose-optimization program which includes automated exposure control, adjustment of the mA and/or kV according to patient size and/or use of iterative reconstruction technique. COMPARISON:  None Available. FINDINGS: Brain: No acute intracranial abnormality. Specifically, no hemorrhage, hydrocephalus, mass lesion, acute infarction, or significant intracranial injury. Vascular: No hyperdense vessel or unexpected calcification. Skull: No acute calvarial abnormality. Sinuses/Orbits: No acute findings Other: None IMPRESSION: No acute intracranial abnormality. Electronically Signed   By: Charlett Nose M.D.   On: 08/16/2023 00:55     PROCEDURES:  Critical Care performed: No  .1-3 Lead EKG Interpretation  Performed by: Irean Hong, MD Authorized by: Irean Hong, MD     Interpretation: abnormal     ECG rate:  55   ECG rate assessment: bradycardic     Rhythm: sinus bradycardia     Ectopy: none     Conduction: normal   Comments:     Patient placed on cardiac monitor to evaluate for arrhythmias    MEDICATIONS ORDERED IN ED: Medications  aspirin chewable tablet 324 mg (has no administration in time range)  heparin ADULT infusion 100 units/mL (25000 units/240mL) (has no administration in  time range)  heparin injection 4,000 Units (has no administration in time range)  sodium chloride 0.9 % bolus 1,000 mL (1,000 mLs Intravenous New Bag/Given 08/16/23 0144)  fosfomycin (MONUROL) packet 3 g (3 g Oral Given 08/16/23 0230)     IMPRESSION / MDM / ASSESSMENT AND PLAN / ED COURSE  I reviewed the triage vital signs and the nursing notes.                             89 year old female who presents with large forearm skin tear status post fall and dizziness.  Differential diagnosis includes but is not limited to CVA, ICH, ACS, infectious, metabolic etiologies, etc.  I have personally reviewed patient's records and note PCP office visit on 04/16/2023.  Patient's presentation is most consistent with acute illness / injury with system symptoms.  The patient is on the cardiac monitor to evaluate for evidence of arrhythmia and/or significant heart rate changes.  Will obtain cardiac panel, imaging studies and reassess.  Skin tear will be cleansed and dressed with nonadherent dressing.  Clinical Course as of 08/16/23 0343  Wynelle Link Aug 16, 2023  0141 Patient noted to be tachycardic.  EKG obtained which demonstrates sinus tachycardia.  Will infuse IV fluids as creatinine is elevated from prior.  Troponin is minimally elevated, likely secondary to demand ischemia.  Will repeat.  Trace leukocytes on urine, will treat with antibiotic.  Of note, patient's heart rate noted to jump 20 points higher on her standing. [JS]  0205 Heart rate normalized, currently 60.  Repeat EKG obtained. [JS]  0342 Repeat troponin increased.  Given acute arrhythmia, dizziness causing fall and elevated troponin, will treat for ACS and consult hospitalist services for evaluation and admission.  Patient and daughter verbalized understanding and are agreeable with plan of care. [JS]    Clinical Course User Index [JS] Irean Hong, MD     FINAL CLINICAL IMPRESSION(S) / ED DIAGNOSES   Final diagnoses:  Fall, initial encounter  Skin tear of forearm without complication, initial encounter  Orthostatic dizziness  Dehydration  Lower urinary tract infectious disease  NSTEMI (non-ST elevated myocardial infarction) (HCC)     Rx / DC Orders   ED Discharge Orders     None        Note:  This document was prepared using Dragon voice recognition software and may include unintentional dictation errors.   Irean Hong, MD 08/16/23 [phone]

## 2023-08-15 NOTE — ED Provider Notes (Incomplete)
[facility] Provider Note    Event Date/Time   First MD Initiated Contact with Patient 08/15/23 2350     (approximate)   History   Fall   HPI  Sandra Delgado is a 89 y.o. female brought to the ED from home via EMS status post fall with right forearm laceration.  Patient with a history of COPD, CKD, hypertension on Plavix.  Unsure why she fell, states she might have felt dizzy.  Struck her right forearm on the bathroom sink.  Denies striking head or LOC but states she did fall last week striking her head but did not seek medical evaluation.  Denies vision changes, headache, neck pain, chest pain, shortness of breath, abdominal pain, nausea or vomiting.     Past Medical History   Past Medical History:  Diagnosis Date  . Arthritis   . CKD (chronic kidney disease) stage 3, GFR 30-59 ml/min (HCC)   . COPD (chronic obstructive pulmonary disease) (HCC)   . HLD (hyperlipidemia)   . HTN (hypertension)   . IBS (irritable bowel syndrome)   . Lymphoma (HCC)   . Migraine      Active Problem List   Patient Active Problem List   Diagnosis Date Noted  . NSTEMI (non-ST elevated myocardial infarction) (HCC) 10/06/2017  . COPD exacerbation (HCC) 02/06/2016  . HLD (hyperlipidemia) 02/06/2016  . HTN (hypertension) 02/06/2016  . GERD (gastroesophageal reflux disease) 02/06/2016     Past Surgical History   Past Surgical History:  Procedure Laterality Date  . ABDOMINAL HYSTERECTOMY    . APPENDECTOMY    . COLONOSCOPY    . LEFT HEART CATH AND CORONARY ANGIOGRAPHY N/A 10/07/2017   Procedure: LEFT HEART CATH AND CORONARY ANGIOGRAPHY;  Surgeon: Dalia Heading, MD;  Location: [facility] INVASIVE CV LAB;  Service: Cardiovascular;  Laterality: N/A;     Home Medications   Prior to Admission medications   Medication Sig Start Date End Date Taking? Authorizing Provider  aspirin EC 81 MG EC tablet Take 1 tablet (81 mg total) by mouth daily. 10/07/17   Katha Hamming, MD  atorvastatin (LIPITOR) 40 MG tablet Take 1 tablet (40 mg total) by mouth daily at 6 PM. 10/07/17   Katha Hamming, MD  carvedilol (COREG) 3.125 MG tablet Take 3.125 mg by mouth 2 (two) times daily with a meal.    [provider]  losartan (COZAAR) 100 MG tablet Take 100 mg by mouth daily.    [provider]  omeprazole (PRILOSEC) 40 MG capsule Take 40 mg by mouth 2 (two) times daily.     [provider]     Allergies  Ace inhibitors, Amoxicillin, Atorvastatin, Ezetimibe, and Niacin and related   Family History   Family History  Problem Relation Age of Onset  . Colon cancer Mother      Physical Exam  Triage Vital Signs: ED Triage Vitals  Encounter Vitals Group     BP 08/15/23 2348 (!) 175/70     Systolic BP Percentile --      Diastolic BP Percentile --      Pulse Rate 08/15/23 2348 (!) 57     Resp 08/15/23 2348 17     Temp 08/15/23 2349 98.4 F (36.9 C)     Temp Source 08/15/23 2349 Oral     SpO2 08/15/23 2348 99 %     Weight 08/15/23 2350 240 lb 11.9 oz (109.2 kg)     Height 08/15/23 2350 5\' 1"  (1.549 m)  Head Circumference --      Peak Flow --      Pain Score 08/15/23 2349 6     Pain Loc --      Pain Education --      Exclude from Growth Chart --     Updated Vital Signs: BP (!) 175/70   Pulse (!) 57   Temp 98.4 F (36.9 C) (Oral)   Resp 17   Ht 5\' 1"  (1.549 m)   Wt 109.2 kg   SpO2 99%   BMI 45.49 kg/m    General: Awake, no distress.  CV:  Good peripheral perfusion.  Resp:  Normal effort.  Abd:  No distention.  Other:  Right forearm: Large skin tear, unable to be sutured.  2+ radial pulse.  Brisk, less than 5-second cap refill.  5/5 motor strength and sensation.   ED Results / Procedures / Treatments  Labs (all labs ordered are listed, but only abnormal results are displayed) Labs Reviewed  CBC WITH DIFFERENTIAL/PLATELET  COMPREHENSIVE METABOLIC PANEL  URINALYSIS, ROUTINE W REFLEX MICROSCOPIC   TROPONIN I (HIGH SENSITIVITY)     EKG  ***   RADIOLOGY *** {You MUST document your own interpretation of imaging, as well as the fact that you reviewed the radiologist's report!:1}  Official radiology report(s): No results found.   PROCEDURES:  Critical Care performed: {CriticalCareYesNo:19197::"Yes, see critical care procedure note(s)","No"}  Procedures   MEDICATIONS ORDERED IN ED: Medications - No data to display   IMPRESSION / MDM / ASSESSMENT AND PLAN / ED COURSE  I reviewed the triage vital signs and the nursing notes.                              Differential diagnosis includes, but is not limited to, ***  Patient's presentation is most consistent with {EM COPA:27473}  {If the patient is on the monitor, remove the brackets and asterisks on the sentence below and remember to document it as a Procedure as well. Otherwise delete the sentence below:1} {**The patient is on the cardiac monitor to evaluate for evidence of arrhythmia and/or significant heart rate changes.**}  {Remember to include, when applicable, any/all of the following data: independent review of imaging independent review of labs (comment specifically on pertinent positives and negatives) review of specific prior hospitalizations, PCP/specialist notes, etc. discuss meds given and prescribed document any discussion with consultants (including hospitalists) any clinical decision tools you used and why (PECARN, NEXUS, etc.) did you consider admitting the patient? document social determinants of health affecting patient's care (homelessness, inability to follow up in a timely fashion, etc) document any pre-existing conditions increasing risk on current visit (e.g. diabetes and HTN increasing danger of high-risk chest pain/ACS) describes what meds you gave (especially parenteral) and why any other interventions?:1}      FINAL CLINICAL IMPRESSION(S) / ED DIAGNOSES   Final diagnoses:  Fall,  initial encounter  Skin tear of forearm without complication, initial encounter     Rx / DC Orders   ED Discharge Orders     None        Note:  This document was prepared using Dragon voice recognition software and may include unintentional dictation errors.

## 2023-08-16 ENCOUNTER — Observation Stay: Payer: Medicare HMO

## 2023-08-16 ENCOUNTER — Emergency Department: Payer: Medicare HMO

## 2023-08-16 DIAGNOSIS — R42 Dizziness and giddiness: Secondary | ICD-10-CM

## 2023-08-16 DIAGNOSIS — E785 Hyperlipidemia, unspecified: Secondary | ICD-10-CM | POA: Diagnosis present

## 2023-08-16 DIAGNOSIS — I10 Essential (primary) hypertension: Secondary | ICD-10-CM | POA: Diagnosis present

## 2023-08-16 DIAGNOSIS — I214 Non-ST elevation (NSTEMI) myocardial infarction: Secondary | ICD-10-CM

## 2023-08-16 DIAGNOSIS — K219 Gastro-esophageal reflux disease without esophagitis: Secondary | ICD-10-CM | POA: Diagnosis present

## 2023-08-16 DIAGNOSIS — N179 Acute kidney failure, unspecified: Secondary | ICD-10-CM | POA: Diagnosis present

## 2023-08-16 DIAGNOSIS — G629 Polyneuropathy, unspecified: Secondary | ICD-10-CM

## 2023-08-16 LAB — TROPONIN I (HIGH SENSITIVITY)
Troponin I (High Sensitivity): 129 ng/L (ref <18)
Troponin I (High Sensitivity): 143 ng/L (ref <18)
Troponin I (High Sensitivity): 25 ng/L — ABNORMAL HIGH (ref <18)
Troponin I (High Sensitivity): 68 ng/L — ABNORMAL HIGH (ref <18)

## 2023-08-16 LAB — CBC WITH DIFFERENTIAL/PLATELET
Abs Immature Granulocytes: 0.02 10*3/uL (ref 0.00–0.07)
Basophils Absolute: 0.1 10*3/uL (ref 0.0–0.1)
Basophils Relative: 1 %
Eosinophils Absolute: 0.2 10*3/uL (ref 0.0–0.5)
Eosinophils Relative: 3 %
HCT: 38.4 % (ref 36.0–46.0)
Hemoglobin: 12.8 g/dL (ref 12.0–15.0)
Immature Granulocytes: 0 %
Lymphocytes Relative: 34 %
Lymphs Abs: 2.5 10*3/uL (ref 0.7–4.0)
MCH: 31.8 pg (ref 26.0–34.0)
MCHC: 33.3 g/dL (ref 30.0–36.0)
MCV: 95.3 fL (ref 80.0–100.0)
Monocytes Absolute: 0.5 10*3/uL (ref 0.1–1.0)
Monocytes Relative: 7 %
Neutro Abs: 3.9 10*3/uL (ref 1.7–7.7)
Neutrophils Relative %: 55 %
Platelets: 230 10*3/uL (ref 150–400)
RBC: 4.03 MIL/uL (ref 3.87–5.11)
RDW: 12 % (ref 11.5–15.5)
WBC: 7.2 10*3/uL (ref 4.0–10.5)
nRBC: 0 % (ref 0.0–0.2)

## 2023-08-16 LAB — COMPREHENSIVE METABOLIC PANEL
ALT: 13 U/L (ref 0–44)
AST: 21 U/L (ref 15–41)
Albumin: 3.8 g/dL (ref 3.5–5.0)
Alkaline Phosphatase: 41 U/L (ref 38–126)
Anion gap: 6 (ref 5–15)
BUN: 22 mg/dL (ref 8–23)
CO2: 26 mmol/L (ref 22–32)
Calcium: 8.8 mg/dL — ABNORMAL LOW (ref 8.9–10.3)
Chloride: 103 mmol/L (ref 98–111)
Creatinine, Ser: 1.4 mg/dL — ABNORMAL HIGH (ref 0.44–1.00)
GFR, Estimated: 36 mL/min — ABNORMAL LOW (ref >60)
Glucose, Bld: 129 mg/dL — ABNORMAL HIGH (ref 70–99)
Potassium: 3.6 mmol/L (ref 3.5–5.1)
Sodium: 135 mmol/L (ref 135–145)
Total Bilirubin: 1 mg/dL (ref 0.3–1.2)
Total Protein: 6.8 g/dL (ref 6.5–8.1)

## 2023-08-16 LAB — CBC
HCT: 35.2 % — ABNORMAL LOW (ref 36.0–46.0)
Hemoglobin: 11.8 g/dL — ABNORMAL LOW (ref 12.0–15.0)
MCH: 32.3 pg (ref 26.0–34.0)
MCHC: 33.5 g/dL (ref 30.0–36.0)
MCV: 96.4 fL (ref 80.0–100.0)
Platelets: 222 10*3/uL (ref 150–400)
RBC: 3.65 MIL/uL — ABNORMAL LOW (ref 3.87–5.11)
RDW: 11.9 % (ref 11.5–15.5)
WBC: 8.6 10*3/uL (ref 4.0–10.5)
nRBC: 0 % (ref 0.0–0.2)

## 2023-08-16 LAB — HEPARIN LEVEL (UNFRACTIONATED)
Heparin Unfractionated: >1.1 [IU]/mL — ABNORMAL HIGH (ref 0.30–0.70)
Heparin Unfractionated: 0.83 [IU]/mL — ABNORMAL HIGH (ref 0.30–0.70)

## 2023-08-16 LAB — URINALYSIS, ROUTINE W REFLEX MICROSCOPIC
Bacteria, UA: NONE SEEN
Bilirubin Urine: NEGATIVE
Glucose, UA: NEGATIVE mg/dL
Hgb urine dipstick: NEGATIVE
Ketones, ur: NEGATIVE mg/dL
Nitrite: NEGATIVE
Protein, ur: NEGATIVE mg/dL
Specific Gravity, Urine: 1.014 (ref 1.005–1.030)
Squamous Epithelial / HPF: NONE SEEN /HPF (ref 0–5)
pH: 5 (ref 5.0–8.0)

## 2023-08-16 LAB — LIPID PANEL
Cholesterol: 141 mg/dL (ref 0–200)
HDL: 55 mg/dL (ref >40)
LDL Cholesterol: 67 mg/dL (ref 0–99)
Total CHOL/HDL Ratio: 2.6 RATIO
Triglycerides: 96 mg/dL (ref <150)
VLDL: 19 mg/dL (ref 0–40)

## 2023-08-16 LAB — BASIC METABOLIC PANEL
Anion gap: 6 (ref 5–15)
BUN: 20 mg/dL (ref 8–23)
CO2: 24 mmol/L (ref 22–32)
Calcium: 8.3 mg/dL — ABNORMAL LOW (ref 8.9–10.3)
Chloride: 107 mmol/L (ref 98–111)
Creatinine, Ser: 1.2 mg/dL — ABNORMAL HIGH (ref 0.44–1.00)
GFR, Estimated: 44 mL/min — ABNORMAL LOW (ref >60)
Glucose, Bld: 108 mg/dL — ABNORMAL HIGH (ref 70–99)
Potassium: 3.9 mmol/L (ref 3.5–5.1)
Sodium: 137 mmol/L (ref 135–145)

## 2023-08-16 LAB — PROTIME-INR
INR: 1.1 (ref 0.8–1.2)
Prothrombin Time: 14.3 seconds (ref 11.4–15.2)

## 2023-08-16 LAB — APTT: aPTT: 26 seconds (ref 24–36)

## 2023-08-16 LAB — D-DIMER, QUANTITATIVE: D-Dimer, Quant: 0.62 ug{FEU}/mL — ABNORMAL HIGH (ref 0.00–0.50)

## 2023-08-16 MED ORDER — MAGNESIUM HYDROXIDE 400 MG/5ML PO SUSP: 30.0000 mL | Freq: Every day | ORAL | Status: DC | PRN

## 2023-08-16 MED ORDER — HEPARIN (PORCINE) 25000 UT/250ML-% IV SOLN
550.0000 [IU]/h | INTRAVENOUS | Status: DC
  Administered 2023-08-16: 750 [IU]/h via INTRAVENOUS
  Administered 2023-08-17: 550 [IU]/h via INTRAVENOUS
  Filled 2023-08-16: qty 250

## 2023-08-16 MED ORDER — TRAZODONE HCL 50 MG PO TABS
25.0000 mg | ORAL_TABLET | Freq: Every evening | ORAL | Status: DC | PRN
  Administered 2023-08-17: 25 mg via ORAL
  Filled 2023-08-16: qty 1

## 2023-08-16 MED ORDER — ASPIRIN 81 MG PO TBEC: 81.0000 mg | DELAYED_RELEASE_TABLET | Freq: Every day | ORAL | Status: DC

## 2023-08-16 MED ORDER — FOSFOMYCIN TROMETHAMINE 3 G PO PACK
3.0000 g | PACK | Freq: Once | ORAL | Status: AC
  Administered 2023-08-16: 3 g via ORAL
  Filled 2023-08-16: qty 3

## 2023-08-16 MED ORDER — ASPIRIN 81 MG PO CHEW: 324.0000 mg | CHEWABLE_TABLET | Freq: Once | ORAL | Status: DC

## 2023-08-16 MED ORDER — ASPIRIN 81 MG PO TBEC
81.0000 mg | DELAYED_RELEASE_TABLET | Freq: Every day | ORAL | Status: DC
  Administered 2023-08-17 – 2023-08-22 (×6): 81 mg via ORAL
  Filled 2023-08-16 (×7): qty 1

## 2023-08-16 MED ORDER — HEPARIN SODIUM (PORCINE) 5000 UNIT/ML IJ SOLN: 4000.0000 [IU] | Freq: Once | INTRAMUSCULAR | Status: DC

## 2023-08-16 MED ORDER — CARVEDILOL 3.125 MG PO TABS
3.1250 mg | ORAL_TABLET | Freq: Two times a day (BID) | ORAL | Status: DC
  Administered 2023-08-16 – 2023-08-22 (×13): 3.125 mg via ORAL
  Filled 2023-08-16 (×13): qty 1

## 2023-08-16 MED ORDER — HEPARIN (PORCINE) 25000 UT/250ML-% IV SOLN
900.0000 [IU]/h | INTRAVENOUS | Status: DC
  Administered 2023-08-16: 900 [IU]/h via INTRAVENOUS
  Filled 2023-08-16: qty 250

## 2023-08-16 MED ORDER — ACETAMINOPHEN 325 MG PO TABS
650.0000 mg | ORAL_TABLET | ORAL | Status: DC | PRN
  Administered 2023-08-17: 650 mg via ORAL
  Filled 2023-08-16: qty 2

## 2023-08-16 MED ORDER — SODIUM CHLORIDE 0.9 % IV BOLUS
1000.0000 mL | Freq: Once | INTRAVENOUS | Status: AC
  Administered 2023-08-16: 1000 mL via INTRAVENOUS

## 2023-08-16 MED ORDER — LOPERAMIDE HCL 2 MG PO CAPS: 2.0000 mg | ORAL_CAPSULE | ORAL | Status: DC | PRN

## 2023-08-16 MED ORDER — ONDANSETRON HCL 4 MG/2ML IJ SOLN: 4.0000 mg | Freq: Four times a day (QID) | INTRAMUSCULAR | Status: DC | PRN

## 2023-08-16 MED ORDER — ATORVASTATIN CALCIUM 20 MG PO TABS
80.0000 mg | ORAL_TABLET | Freq: Every day | ORAL | Status: DC
  Administered 2023-08-16 – 2023-08-21 (×6): 80 mg via ORAL
  Filled 2023-08-16: qty 1
  Filled 2023-08-16 (×2): qty 4
  Filled 2023-08-16: qty 1
  Filled 2023-08-16 (×3): qty 4

## 2023-08-16 MED ORDER — MORPHINE SULFATE (PF) 2 MG/ML IV SOLN
2.0000 mg | INTRAVENOUS | Status: DC | PRN
  Administered 2023-08-18: 2 mg via INTRAVENOUS
  Filled 2023-08-16: qty 1

## 2023-08-16 MED ORDER — ALPRAZOLAM 0.25 MG PO TABS
0.2500 mg | ORAL_TABLET | Freq: Two times a day (BID) | ORAL | Status: DC | PRN
  Administered 2023-08-17: 0.25 mg via ORAL
  Filled 2023-08-16: qty 1

## 2023-08-16 MED ORDER — HEPARIN (PORCINE) 25000 UT/250ML-% IV SOLN: 14.0000 [IU]/kg/h | INTRAVENOUS | Status: DC

## 2023-08-16 MED ORDER — ASPIRIN 81 MG PO CHEW
324.0000 mg | CHEWABLE_TABLET | ORAL | Status: AC
  Administered 2023-08-16: 324 mg via ORAL
  Filled 2023-08-16: qty 4

## 2023-08-16 MED ORDER — LIDOCAINE-EPINEPHRINE 2 %-1:100000 IJ SOLN
20.0000 mL | Freq: Once | INTRAMUSCULAR | Status: AC
  Administered 2023-08-16: 20 mL
  Filled 2023-08-16: qty 1

## 2023-08-16 MED ORDER — SODIUM CHLORIDE 0.9 % IV SOLN: INTRAVENOUS | Status: DC

## 2023-08-16 MED ORDER — ASPIRIN 300 MG RE SUPP: 300.0000 mg | RECTAL | Status: AC

## 2023-08-16 MED ORDER — LOSARTAN POTASSIUM 50 MG PO TABS
100.0000 mg | ORAL_TABLET | Freq: Every day | ORAL | Status: DC
  Administered 2023-08-16 – 2023-08-22 (×7): 100 mg via ORAL
  Filled 2023-08-16 (×7): qty 2

## 2023-08-16 MED ORDER — PANTOPRAZOLE SODIUM 40 MG PO TBEC
40.0000 mg | DELAYED_RELEASE_TABLET | Freq: Every day | ORAL | Status: DC
  Administered 2023-08-16 – 2023-08-22 (×7): 40 mg via ORAL
  Filled 2023-08-16 (×7): qty 1

## 2023-08-16 MED ORDER — IOHEXOL 350 MG/ML SOLN
75.0000 mL | Freq: Once | INTRAVENOUS | Status: AC | PRN
  Administered 2023-08-16: 75 mL via INTRAVENOUS

## 2023-08-16 MED ORDER — NITROGLYCERIN 0.4 MG SL SUBL: 0.4000 mg | SUBLINGUAL_TABLET | SUBLINGUAL | Status: DC | PRN

## 2023-08-16 MED ORDER — HEPARIN BOLUS VIA INFUSION
4000.0000 [IU] | Freq: Once | INTRAVENOUS | Status: AC
  Administered 2023-08-16: 4000 [IU] via INTRAVENOUS
  Filled 2023-08-16: qty 4000

## 2023-08-16 NOTE — ED Provider Notes (Signed)
..Laceration Repair  Date/Time: 08/16/2023 9:17 AM  Performed by: Sharman Cheek, MD Authorized by: Sharman Cheek, MD   Consent:    Consent obtained:  Verbal   Consent given by:  Patient   Risks, benefits, and alternatives were discussed: yes     Risks discussed:  Infection, need for additional repair, nerve damage, poor wound healing, poor cosmetic result and pain   Alternatives discussed:  No treatment Universal protocol:    Procedure explained and questions answered to patient or proxy's satisfaction: yes     Test results available: yes     Imaging studies available: yes     Required blood products, implants, devices, and special equipment available: yes     Patient identity confirmed:  Verbally with patient and arm band Anesthesia:    Anesthesia method:  Local infiltration   Local anesthetic:  Lidocaine 2% WITH epi Laceration details:    Location:  Shoulder/arm   Shoulder/arm location:  R lower arm   Length (cm):  8   Depth (mm):  15 Pre-procedure details:    Preparation:  Patient was prepped and draped in usual sterile fashion and imaging obtained to evaluate for foreign bodies Exploration:    Hemostasis achieved with:  Direct pressure and tied off vessels (surgicell)   Imaging obtained: x-ray     Imaging outcome: foreign body not noted     Wound exploration: wound explored through full range of motion and entire depth of wound visualized     Wound extent: fascia not violated, no foreign body, no signs of injury, no nerve damage, no tendon damage, no underlying fracture and no vascular damage     Contaminated: no   Treatment:    Area cleansed with:  Povidone-iodine   Amount of cleaning:  Extensive   Irrigation solution:  Sterile saline   Debridement:  Moderate   Undermining:  Extensive   Layers/structures repaired:  Deep subcutaneous and deep dermal/superficial fascia Deep subcutaneous:    Suture size:  4-0   Suture material:  Vicryl   Suture technique:  Simple  interrupted   Number of sutures:  4 Deep dermal/superficial fascia:    Suture size:  4-0   Suture material:  Vicryl   Suture technique:  Running   Number of sutures:  14 Skin repair:    Repair method:  Sutures   Suture size:  4-0   Suture material:  Fast-absorbing gut   Suture technique:  Simple interrupted   Number of sutures:  2 Repair type:    Repair type:  Complex Post-procedure details:    Dressing:  Sterile dressing   Procedure completion:  Tolerated well, no immediate complications   Clinical Course as of 08/16/23 0915  Sun Aug 16, 2023  0141 Patient noted to be tachycardic.  EKG obtained which demonstrates sinus tachycardia.  Will infuse IV fluids as creatinine is elevated from prior.  Troponin is minimally elevated, likely secondary to demand ischemia.  Will repeat.  Trace leukocytes on urine, will treat with antibiotic.  Of note, patient's heart rate noted to jump 20 points higher on her standing. [JS]  0205 Heart rate normalized, currently 60.  Repeat EKG obtained. [JS]  0342 Repeat troponin increased.  Given acute arrhythmia, dizziness causing fall and elevated troponin, will treat for ACS and consult hospitalist services for evaluation and admission.  Patient and daughter verbalized understanding and are agreeable with plan of care. [JS]    Clinical Course User Index [JS] Irean Hong, MD    -----------------------------------------  8:15 AM on 08/16/2023 ----------------------------------------- Called to bedside due to bleeding from right forearm wound.  Reviewed xray, which is negative for fx. On exam, there is superficial skin tear along with angular laceration through soft tissue down to fascia creating a flap. No muscle/tendon injury.  There are some small lacerated cutaneous arteries bleeding which were tied. Wound was cleaned, irrigated, and closed. Dressed with xeroform, gauze, coban.  unable to close skin due to large superficial skin tear and devitalized skin  area, but deep dermal layer is closed.     Sharman Cheek, MD 08/16/23 [phone]

## 2023-08-16 NOTE — Progress Notes (Signed)
ANTICOAGULATION CONSULT NOTE -   Pharmacy Consult for Heparin  Indication: chest pain/ACS  Allergies  Allergen Reactions   Ace Inhibitors Other (See Comments)    Reaction: unknown   Amoxicillin Diarrhea    Has patient had a PCN reaction causing immediate rash, facial/tongue/throat swelling, SOB or lightheadedness with hypotension: No Has patient had a PCN reaction causing severe rash involving mucus membranes or skin necrosis: No Has patient had a PCN reaction that required hospitalization No Has patient had a PCN reaction occurring within the last 10 years: Yes If all of the above answers are "NO", then may proceed with Cephalosporin use.    Atorvastatin Other (See Comments)    Reaction: muscle pain   Ezetimibe Other (See Comments)    Reaction: fatigue   Niacin And Related Other (See Comments)    Reaction: flushing    Patient Measurements: Height: 5\' 1"  (154.9 cm) Weight: 109.2 kg (240 lb 11.9 oz) IBW/kg (Calculated) : 47.8 Heparin Dosing Weight: 74.6 kg   Vital Signs: Temp: 98.5 F (36.9 C) (08/25 2030) Temp Source: Oral (08/25 2030) BP: 138/56 (08/25 2030) Pulse Rate: 64 (08/25 2030)  Labs: Recent Labs    08/16/23 0000 08/16/23 0232 08/16/23 0424 08/16/23 0856 08/16/23 1306 08/16/23 1858 08/16/23 2208  HGB 12.8  --  11.8*  --   --   --   --   HCT 38.4  --  35.2*  --   --   --   --   PLT 230  --  222  --   --   --   --   APTT  --   --  26  --   --   --   --   LABPROT  --   --  14.3  --   --   --   --   INR  --   --  1.1  --   --   --   --   HEPARINUNFRC  --   --   --   --  >1.10*  --  0.83*  CREATININE 1.40*  --  1.20*  --   --   --   --   TROPONINIHS 25* 129*  --  143*  --  68*  --     Estimated Creatinine Clearance: 37 mL/min (A) (by C-G formula based on SCr of 1.2 mg/dL (H)).   Medical History: Past Medical History:  Diagnosis Date   Arthritis    CKD (chronic kidney disease) stage 3, GFR 30-59 ml/min (HCC)    COPD (chronic obstructive pulmonary  disease) (HCC)    HLD (hyperlipidemia)    HTN (hypertension)    IBS (irritable bowel syndrome)    Lymphoma (HCC)    Migraine     Medications:  Medications Prior to Admission  Medication Sig Dispense Refill Last Dose   atorvastatin (LIPITOR) 40 MG tablet Take 1 tablet (40 mg total) by mouth daily at 6 PM. 30 tablet 0 08/15/2023   carvedilol (COREG) 3.125 MG tablet Take 3.125 mg by mouth 2 (two) times daily with a meal.   08/15/2023 at 2000   clopidogrel (PLAVIX) 75 MG tablet Take 75 mg by mouth daily.   08/15/2023   gabapentin (NEURONTIN) 300 MG capsule Take 300 mg by mouth at bedtime.   08/15/2023 at 2000   losartan (COZAAR) 100 MG tablet Take 100 mg by mouth daily.   08/15/2023   omeprazole (PRILOSEC) 40 MG capsule Take 40 mg by mouth 2 (two)  times daily.    08/15/2023   pravastatin (PRAVACHOL) 80 MG tablet 40 mg at bedtime.      aspirin EC 81 MG EC tablet Take 1 tablet (81 mg total) by mouth daily. (Patient not taking: Reported on 08/16/2023) 30 tablet 0 Not Taking    Assessment: Pharmacy consulted to dose heparin in this 89 year old female admitted with ACS/NSTEMI.  No prior anticoag noted.  CrCl = 31.7 ml/min   8/25 1306 HL >1.10,  SUPRAtherapeutic 8/25 2208 HL    0.83, SUPRAtherapeutic    Goal of Therapy:  Heparin level 0.3-0.7 units/ml Monitor platelets by anticoagulation protocol: Yes   Plan:  8/25:  HL @ 2208 = 0.83, elevated - Will decrease heparin drip to 600 units/hr and recheck HL 8 hrs after rate change  CBC daily  Morgana Rowley D 08/16/2023,10:53 PM

## 2023-08-16 NOTE — ED Notes (Addendum)
MD at bedside assessing patients wrist wound

## 2023-08-16 NOTE — Progress Notes (Addendum)
Brief rounding note, same day as admission  HPI: Patient admitted earlier this AM after presenting to the ED after a fall preceded by dizziness, concerning for potential syncope or presycope.  See full H&P by Dr. Arville Care for detailed HPI on admission.  She was found to have troponin elevation with rising trend, therefore started on empiric heparin drip pending further evaluation. Cardiology was consulted on admission.  Interval history: Pt seen in the ED with son at bedside, holding for a bed.  She denies ever having chest pain.  She describes recent episodes where she "goes down on the floor", typically feels dizzy beforehand.  She is concerned about caring for the wound on her right arm at home.  States she is very active, goes to senior center, walks on treadmill daily for 25 minutes.  Does not feel overly dyspneic or experience chest pain with that.  Exam: General exam: awake, alert, no acute distress HEENT: moist mucus membranes, hearing grossly normal  Respiratory system: CTAB, no wheezes, rales or rhonchi, normal respiratory effort. Cardiovascular system: normal S1/S2, RRR, no JVD, murmurs, rubs, gallops,  no pedal edema.   Gastrointestinal system: soft, NT, ND, no HSM felt, +bowel sounds. Central nervous system: A&O x3. no gross focal neurologic deficits, normal speech Extremities: right forearm dressing and kerlix in place, distal extremity is warm with intact sensation  Skin: dry, intact, normal temperature Psychiatry: normal mood, congruent affect, judgement and insight appear normal   A&P: as per H&P by Dr. Arville Care and per Cardiology, with any changes or additions as below:  Elevated troponin is likely due to demand ischemia, more likely than ACS given lack of chest pain or anginal symptoms per patient with her treadmill exercise daily.  --Trend troponin to peak --Stress test planned for tomorrow  --Follow orthostatic vitals --Continue IV heparin --Follow pending  Echo       No charge

## 2023-08-16 NOTE — Assessment & Plan Note (Signed)
-   We will continue statin therapy. 

## 2023-08-16 NOTE — Progress Notes (Signed)
ANTICOAGULATION CONSULT NOTE - Initial Consult  Pharmacy Consult for Heparin  Indication: chest pain/ACS  Allergies  Allergen Reactions   Ace Inhibitors Other (See Comments)    Reaction: unknown   Amoxicillin Diarrhea    Has patient had a PCN reaction causing immediate rash, facial/tongue/throat swelling, SOB or lightheadedness with hypotension: No Has patient had a PCN reaction causing severe rash involving mucus membranes or skin necrosis: No Has patient had a PCN reaction that required hospitalization No Has patient had a PCN reaction occurring within the last 10 years: Yes If all of the above answers are "NO", then may proceed with Cephalosporin use.    Atorvastatin Other (See Comments)    Reaction: muscle pain   Ezetimibe Other (See Comments)    Reaction: fatigue   Niacin And Related Other (See Comments)    Reaction: flushing    Patient Measurements: Height: 5\' 1"  (154.9 cm) Weight: 109.2 kg (240 lb 11.9 oz) IBW/kg (Calculated) : 47.8 Heparin Dosing Weight: 74.6 kg   Vital Signs: Temp: 98.4 F (36.9 C) (08/24 2349) Temp Source: Oral (08/24 2349) BP: 159/70 (08/25 0143) Pulse Rate: 60 (08/25 0143)  Labs: Recent Labs    08/16/23 0000 08/16/23 0232  HGB 12.8  --   HCT 38.4  --   PLT 230  --   CREATININE 1.40*  --   TROPONINIHS 25* 129*    Estimated Creatinine Clearance: 31.7 mL/min (A) (by C-G formula based on SCr of 1.4 mg/dL (H)).   Medical History: Past Medical History:  Diagnosis Date   Arthritis    CKD (chronic kidney disease) stage 3, GFR 30-59 ml/min (HCC)    COPD (chronic obstructive pulmonary disease) (HCC)    HLD (hyperlipidemia)    HTN (hypertension)    IBS (irritable bowel syndrome)    Lymphoma (HCC)    Migraine     Medications:  (Not in a hospital admission)   Assessment: Pharmacy consulted to dose heparin in this 89 year old female admitted with ACS/NSTEMI.  No prior anticoag noted.  CrCl = 31.7 ml/min   Goal of Therapy:   Heparin level 0.3-0.7 units/ml Monitor platelets by anticoagulation protocol: Yes   Plan:  Will order Heparin 4000 units IV X 1 bolus and start heparin drip at 900 units/hr. Will check HL 8 hrs after start of drip. CBC daily  Karon Heckendorn D 08/16/2023,3:50 AM

## 2023-08-16 NOTE — Consult Note (Signed)
[facility] CLINIC CARDIOLOGY CONSULT NOTE       Patient ID: Sandra Delgado MRN: [id number] DOB/AGE: 1933/11/03 89 y.o.  Admit date: 08/15/2023 Referring Physician Dr Chipper Herb Primary Cardiologist Dr Gwen Pounds (retired) Reason for Consultation troponin elevation  HPI: Sandra Delgado is a 89 y.o. female  with a past medical history of hypertension, hyperlipidemia, and COPD who presented to the ED on 08/15/2023 for syncope. Patient has had two syncopal episodes by now and both times it was when changing positions. Patient was laying down and abruptly stood up, then found herself on the floor. She hurt her arm pretty badly when she fell in the bathroom. Her family does admit that her gait has gotten more unsteady lately but they do not know why she is passing out. Patient denies chest pain or shortness of breath with activity. She walks on the treadmill at the senior center three times per week and she never gets tired or short of breath. Other than her syncope, she has not been having any issues. She has never had a heart attack before and her cardiac health has been stable for many years. Denies chest pain, shortness of breath, palpitations, diaphoresis, edema, PND, orthopnea.   Review of systems complete and found to be negative unless listed above     Past Medical History:  Diagnosis Date   Arthritis    CKD (chronic kidney disease) stage 3, GFR 30-59 ml/min (HCC)    COPD (chronic obstructive pulmonary disease) (HCC)    HLD (hyperlipidemia)    HTN (hypertension)    IBS (irritable bowel syndrome)    Lymphoma (HCC)    Migraine     Past Surgical History:  Procedure Laterality Date   ABDOMINAL HYSTERECTOMY     APPENDECTOMY     COLONOSCOPY     LEFT HEART CATH AND CORONARY ANGIOGRAPHY N/A 10/07/2017   Procedure: LEFT HEART CATH AND CORONARY ANGIOGRAPHY;  Surgeon: Dalia Heading, MD;  Location: [facility] INVASIVE CV LAB;  Service: Cardiovascular;  Laterality: N/A;    (Not in a hospital  admission)  Social History   Socioeconomic History   Marital status: Widowed    Spouse name: Not on file   Number of children: Not on file   Years of education: Not on file   Highest education level: Not on file  Occupational History   Not on file  Tobacco Use   Smoking status: Never   Smokeless tobacco: Never  Substance and Sexual Activity   Alcohol use: Yes    Alcohol/week: 0.0 standard drinks of alcohol    Comment: occassional   Drug use: No   Sexual activity: Not on file  Other Topics Concern   Not on file  Social History Narrative   Lives at home by herself. Independent at baseline.   Social Determinants of Health   Financial Resource Strain: Low Risk  (04/16/2023)   Received from [facility]   Overall Financial Resource Strain (CARDIA)    Difficulty of Paying Living Expenses: Not very hard  Food Insecurity: No Food Insecurity (04/16/2023)   Received from [facility]   Hunger Vital Sign    Worried About Running Out of Food in the Last Year: Never true    Ran Out of Food in the Last Year: Never true  Transportation Needs: No Transportation Needs (04/16/2023)   Received from [facility] - Transportation    In the past 12 months, has lack of transportation kept  you from medical appointments or from getting medications?: No    Lack of Transportation (Non-Medical): No  Physical Activity: Not on file  Stress: Not on file  Social Connections: Not on file  Intimate Partner Violence: Not on file    Family History  Problem Relation Age of Onset   Colon cancer Mother      Vitals:   08/16/23 1245 08/16/23 1300 08/16/23 1315 08/16/23 1330  BP:      Pulse: (!) 58 62 (!) 58 (!) 58  Resp: 16 (!) 24 (!) 24 15  Temp:      TempSrc:      SpO2: 98% 99% 99% 99%  Weight:      Height:        PHYSICAL EXAM General: awake and alert, well nourished, in no acute distress. HEENT: Normocephalic and  atraumatic. Neck: No JVD.  Lungs: Normal respiratory effort. Clear bilaterally to auscultation. No wheezes, crackles, rhonchi.  Heart: HRRR. Normal S1 and S2 without gallops or murmurs.  Abdomen: Non-distended appearing.  Msk: Normal strength and tone for age. Extremities: Warm and well perfused. No clubbing, cyanosis. no edema.  Neuro: Alert and oriented X 3. Psych: Answers questions appropriately.   Labs: Basic Metabolic Panel: Recent Labs    08/16/23 0000 08/16/23 0424  NA 135 137  K 3.6 3.9  CL 103 107  CO2 26 24  GLUCOSE 129* 108*  BUN 22 20  CREATININE 1.40* 1.20*  CALCIUM 8.8* 8.3*   Liver Function Tests: Recent Labs    08/16/23 0000  AST 21  ALT 13  ALKPHOS 41  BILITOT 1.0  PROT 6.8  ALBUMIN 3.8   No results for input(s): "LIPASE", "AMYLASE" in the last 72 hours. CBC: Recent Labs    08/16/23 0000 08/16/23 0424  WBC 7.2 8.6  NEUTROABS 3.9  --   HGB 12.8 11.8*  HCT 38.4 35.2*  MCV 95.3 96.4  PLT 230 222   Cardiac Enzymes: Recent Labs    08/16/23 0000 08/16/23 0232 08/16/23 0856  TROPONINIHS 25* 129* 143*   BNP: No results for input(s): "BNP" in the last 72 hours. D-Dimer: Recent Labs    08/16/23 0424  DDIMER 0.62*   Hemoglobin A1C: No results for input(s): "HGBA1C" in the last 72 hours. Fasting Lipid Panel: Recent Labs    08/16/23 0424  CHOL 141  HDL 55  LDLCALC 67  TRIG 96  CHOLHDL 2.6   Thyroid Function Tests: No results for input(s): "TSH", "T4TOTAL", "T3FREE", "THYROIDAB" in the last 72 hours.  Invalid input(s): "FREET3" Anemia Panel: No results for input(s): "VITAMINB12", "FOLATE", "FERRITIN", "TIBC", "IRON", "RETICCTPCT" in the last 72 hours.   Radiology: CT Angio Chest Pulmonary Embolism (PE) W or WO Contrast  Result Date: 08/16/2023 CLINICAL DATA:  89 year old female with history of syncope. Evaluate for pulmonary embolism. EXAM: CT ANGIOGRAPHY CHEST WITH CONTRAST TECHNIQUE: Multidetector CT imaging of the chest was  performed using the standard protocol during bolus administration of intravenous contrast. Multiplanar CT image reconstructions and MIPs were obtained to evaluate the vascular anatomy. RADIATION DOSE REDUCTION: This exam was performed according to the departmental dose-optimization program which includes automated exposure control, adjustment of the mA and/or kV according to patient size and/or use of iterative reconstruction technique. CONTRAST:  75mL OMNIPAQUE IOHEXOL 350 MG/ML SOLN COMPARISON:  CT of the chest, abdomen and pelvis 05/21/2006. FINDINGS: Cardiovascular: No filling defect within the pulmonary arterial tree to suggest pulmonary embolism. Heart size is normal. There is no significant pericardial fluid, thickening  or pericardial calcification. There is aortic atherosclerosis, as well as atherosclerosis of the great vessels of the mediastinum and the coronary arteries, including calcified atherosclerotic plaque in the left main, left anterior descending and right coronary arteries. Mediastinum/Nodes: No pathologically enlarged mediastinal or hilar lymph nodes. Esophagus is unremarkable in appearance. No axillary lymphadenopathy. Lungs/Pleura: No acute consolidative airspace disease. No pleural effusions. No suspicious appearing pulmonary nodules or masses are noted. Upper Abdomen: Aortic atherosclerosis. Musculoskeletal: There are no aggressive appearing lytic or blastic lesions noted in the visualized portions of the skeleton. Review of the MIP images confirms the above findings. IMPRESSION: 1. No acute findings in the thorax to account for the patient's symptoms. Specifically, no evidence of pulmonary embolism. 2. Aortic atherosclerosis, in addition to left main and 2 vessel coronary artery disease. Aortic Atherosclerosis (ICD10-I70.0). Electronically Signed   By: Trudie Reed M.D.   On: 08/16/2023 06:41   DG Pelvis 1-2 Views  Result Date: 08/16/2023 CLINICAL DATA:  Fall, dizziness EXAM: PELVIS  - 1-2 VIEW COMPARISON:  None Available. FINDINGS: There is no evidence of pelvic fracture or diastasis. No pelvic bone lesions are seen. IMPRESSION: Negative. Electronically Signed   By: Charlett Nose M.D.   On: 08/16/2023 01:04   DG Chest 1 View  Result Date: 08/16/2023 CLINICAL DATA:  Fall, dizziness EXAM: CHEST  1 VIEW COMPARISON:  10/06/2017 FINDINGS: Heart and mediastinal contours are within normal limits. No focal opacities or effusions. No acute bony abnormality. Aortic atherosclerosis. IMPRESSION: No active cardiopulmonary disease. Electronically Signed   By: Charlett Nose M.D.   On: 08/16/2023 01:03   DG Forearm Right  Result Date: 08/16/2023 CLINICAL DATA:  Fall, laceration to right forearm EXAM: RIGHT FOREARM - 2 VIEW COMPARISON:  None Available. FINDINGS: Soft tissue swelling along the posterior aspect of the forearm. No radiopaque foreign bodies. No acute bony abnormality. Specifically, no fracture, subluxation, or dislocation. IMPRESSION: Posterior soft tissue laceration and swelling. No acute bony abnormality. Electronically Signed   By: Charlett Nose M.D.   On: 08/16/2023 01:02   CT Head Wo Contrast  Result Date: 08/16/2023 CLINICAL DATA:  Fall.  Head trauma, minor (Age >= 65y) EXAM: CT HEAD WITHOUT CONTRAST TECHNIQUE: Contiguous axial images were obtained from the base of the skull through the vertex without intravenous contrast. RADIATION DOSE REDUCTION: This exam was performed according to the departmental dose-optimization program which includes automated exposure control, adjustment of the mA and/or kV according to patient size and/or use of iterative reconstruction technique. COMPARISON:  None Available. FINDINGS: Brain: No acute intracranial abnormality. Specifically, no hemorrhage, hydrocephalus, mass lesion, acute infarction, or significant intracranial injury. Vascular: No hyperdense vessel or unexpected calcification. Skull: No acute calvarial abnormality. Sinuses/Orbits: No acute  findings Other: None IMPRESSION: No acute intracranial abnormality. Electronically Signed   By: Charlett Nose M.D.   On: 08/16/2023 00:55    ECHO pending  TELEMETRY reviewed by me [facility]) 08/16/2023 : normal sinus rhythm without dynamic EKG changes  EKG reviewed by me: normal sinus rhythm, rate 74 bpm, with LBBB.  Data reviewed by me [facility]) 08/16/2023: last 24h vitals tele labs imaging I/O primary notes  Principal Problem:   NSTEMI (non-ST elevated myocardial infarction) (HCC) Active Problems:   AKI (acute kidney injury) (HCC)   Dizziness   Dyslipidemia   GERD without esophagitis   Peripheral neuropathy   Essential hypertension    ASSESSMENT AND PLAN:   NSTEMI suspect Type II Patient walks 3x/week on the treadmill at the senior center and she  has no anginal symptoms with activity. Presentation most consistent with demand/supply mismatch and not ACS. No EKG changes, no dynamic changes on telemetry. Continue home cardiac medications. Stress echo ordered, if normal, patient is stable for discharge tomorrow from cardiac standpoint. Follow-up in office with me in 1-2 weeks. Discussed with patient and family, all in agreement to take more conservative approach rather than go for cardiac catheterization. Continue heparin gtt until patient is discharged.   Syncope Will place 14 day cardiac monitor prior to hospital discharge. Suspect syncope is due to orthostatic hypotension however given her age, we will rule out high degree heart block. Patient stable for d/c from cardiac standpoint if stress test is normal.   HTN Continue current cardiac medications. Encourage low-sodium diet, less than 2000 mg daily.   HLD Continue Lipitor 80 mg at bedtime    Signed: Clotilde Dieter, DO 08/16/2023, 2:04 PM [facility] Cardiology

## 2023-08-16 NOTE — Assessment & Plan Note (Addendum)
Suspected syncope due to possible orthostatic hypotension. D-dimer was mildly elevated, thus CTA chest obtained with ruled out PE. --Monitor orthostatic vitals --PT/OT evaluations --Telemetry --Fall precautions

## 2023-08-16 NOTE — Progress Notes (Signed)
ANTICOAGULATION CONSULT NOTE -   Pharmacy Consult for Heparin  Indication: chest pain/ACS  Allergies  Allergen Reactions   Ace Inhibitors Other (See Comments)    Reaction: unknown   Amoxicillin Diarrhea    Has patient had a PCN reaction causing immediate rash, facial/tongue/throat swelling, SOB or lightheadedness with hypotension: No Has patient had a PCN reaction causing severe rash involving mucus membranes or skin necrosis: No Has patient had a PCN reaction that required hospitalization No Has patient had a PCN reaction occurring within the last 10 years: Yes If all of the above answers are "NO", then may proceed with Cephalosporin use.    Atorvastatin Other (See Comments)    Reaction: muscle pain   Ezetimibe Other (See Comments)    Reaction: fatigue   Niacin And Related Other (See Comments)    Reaction: flushing    Patient Measurements: Height: 5\' 1"  (154.9 cm) Weight: 109.2 kg (240 lb 11.9 oz) IBW/kg (Calculated) : 47.8 Heparin Dosing Weight: 74.6 kg   Vital Signs: Temp: 98.6 F (37 C) (08/25 0855) Temp Source: Oral (08/25 0855) BP: 151/79 (08/25 1030) Pulse Rate: 58 (08/25 1330)  Labs: Recent Labs    08/16/23 0000 08/16/23 0232 08/16/23 0424 08/16/23 0856 08/16/23 1306  HGB 12.8  --  11.8*  --   --   HCT 38.4  --  35.2*  --   --   PLT 230  --  222  --   --   APTT  --   --  26  --   --   LABPROT  --   --  14.3  --   --   INR  --   --  1.1  --   --   HEPARINUNFRC  --   --   --   --  >1.10*  CREATININE 1.40*  --  1.20*  --   --   TROPONINIHS 25* 129*  --  143*  --     Estimated Creatinine Clearance: 37 mL/min (A) (by C-G formula based on SCr of 1.2 mg/dL (H)).   Medical History: Past Medical History:  Diagnosis Date   Arthritis    CKD (chronic kidney disease) stage 3, GFR 30-59 ml/min (HCC)    COPD (chronic obstructive pulmonary disease) (HCC)    HLD (hyperlipidemia)    HTN (hypertension)    IBS (irritable bowel syndrome)    Lymphoma (HCC)     Migraine     Medications:  (Not in a hospital admission)   Assessment: Pharmacy consulted to dose heparin in this 89 year old female admitted with ACS/NSTEMI.  No prior anticoag noted.  CrCl = 31.7 ml/min   8/25 1306 HL >1.10,  SUPRAtherapeutic   Goal of Therapy:  Heparin level 0.3-0.7 units/ml Monitor platelets by anticoagulation protocol: Yes   Plan:  8/25 1306 HL >1.10,  SUPRAtherapeutic Hold heparin drip x 1 hour and then resume  heparin drip at 750 units/hr. Will check HL 8 hrs after restart of drip. CBC daily  Orma Cheetham A 08/16/2023,2:22 PM

## 2023-08-16 NOTE — H&P (Addendum)
[place]   PATIENT NAME: Sandra Delgado    MR#:  [id number]  DATE OF BIRTH:  October 12, 1933  DATE OF ADMISSION:  08/15/2023  PRIMARY CARE PHYSICIAN: Lauro Regulus, MD   Patient is coming from: Home  REQUESTING/REFERRING PHYSICIAN: Chiquita Loth, MD  CHIEF COMPLAINT:   Chief Complaint  Patient presents with   Fall    HISTORY OF PRESENT ILLNESS:  Sandra Delgado is a 89 y.o. Caucasian female with medical history significant for COPD, hypertension, dyslipidemia, IBS, lymphoma, migraine and osteoarthritis, who presented to the emergency room with acute onset of dizziness when she went to the bathroom with possibly presyncope.  She thought that she fell down without having head injury.  She had similar episode last week when she actually hit her head.  This time she had significant right forearm tear.  She denies any fever or chills.  No cough or wheezing or dyspnea.  No chest pain or palpitations.  She admits to urinary frequency without dysuria or hematuria or flank pain.  No other paresthesias or focal muscle weakness.    ED Course: Upon presentation to the emergency room, BP was 182/70 with heart rate of 58 and later 119 then 60 with respiratory rate of 22.  She was orthostatic.  Labs revealed a creatinine 1.4 with otherwise unremarkable CMP.  High sensitive troponin I was 25 and later 129.  CBC was within normal.  UA showed trace leukocytes with 0-5 WBCs. EKG as reviewed by me : EKG showed  Sinus rhythm with rate of 57 with left bundle branch block Imaging: Noncontrasted head CT scan revealed no acute intracranial normalities.  Portable chest x-ray showed no acute cardiopulmonary disease.  Pelvic x-ray showed no fracture.  Right forearm showed posterior soft tissue laceration and swelling with no acute bony abnormality.  The patient was given 1 L bolus of IV normal saline, aspirin, IV fosfomycin and was started on IV heparin with bolus followed by drip.  She will be admitted to an  observation progressive unit bed for further evaluation and management. PAST MEDICAL HISTORY:   Past Medical History:  Diagnosis Date   Arthritis    CKD (chronic kidney disease) stage 3, GFR 30-59 ml/min (HCC)    COPD (chronic obstructive pulmonary disease) (HCC)    HLD (hyperlipidemia)    HTN (hypertension)    IBS (irritable bowel syndrome)    Lymphoma (HCC)    Migraine     PAST SURGICAL HISTORY:   Past Surgical History:  Procedure Laterality Date   ABDOMINAL HYSTERECTOMY     APPENDECTOMY     COLONOSCOPY     LEFT HEART CATH AND CORONARY ANGIOGRAPHY N/A 10/07/2017   Procedure: LEFT HEART CATH AND CORONARY ANGIOGRAPHY;  Surgeon: Dalia Heading, MD;  Location: [facility] INVASIVE CV LAB;  Service: Cardiovascular;  Laterality: N/A;    SOCIAL HISTORY:   Social History   Tobacco Use   Smoking status: Never   Smokeless tobacco: Never  Substance Use Topics   Alcohol use: Yes    Alcohol/week: 0.0 standard drinks of alcohol    Comment: occassional    FAMILY HISTORY:   Family History  Problem Relation Age of Onset   Colon cancer Mother     DRUG ALLERGIES:   Allergies  Allergen Reactions   Ace Inhibitors Other (See Comments)    Reaction: unknown   Amoxicillin Diarrhea    Has patient had a PCN reaction causing immediate rash, facial/tongue/throat swelling, SOB or lightheadedness  with hypotension: No Has patient had a PCN reaction causing severe rash involving mucus membranes or skin necrosis: No Has patient had a PCN reaction that required hospitalization No Has patient had a PCN reaction occurring within the last 10 years: Yes If all of the above answers are "NO", then may proceed with Cephalosporin use.    Atorvastatin Other (See Comments)    Reaction: muscle pain   Ezetimibe Other (See Comments)    Reaction: fatigue   Niacin And Related Other (See Comments)    Reaction: flushing    REVIEW OF SYSTEMS:   ROS As per history of present illness. All pertinent  systems were reviewed above. Constitutional, HEENT, cardiovascular, respiratory, GI, GU, musculoskeletal, neuro, psychiatric, endocrine, integumentary and hematologic systems were reviewed and are otherwise negative/unremarkable except for positive findings mentioned above in the HPI.   MEDICATIONS AT HOME:   Prior to Admission medications   Medication Sig Start Date End Date Taking? Authorizing Provider  aspirin EC 81 MG EC tablet Take 1 tablet (81 mg total) by mouth daily. 10/07/17   Katha Hamming, MD  atorvastatin (LIPITOR) 40 MG tablet Take 1 tablet (40 mg total) by mouth daily at 6 PM. 10/07/17   Katha Hamming, MD  carvedilol (COREG) 3.125 MG tablet Take 3.125 mg by mouth 2 (two) times daily with a meal.    [provider]  losartan (COZAAR) 100 MG tablet Take 100 mg by mouth daily.    [provider]  omeprazole (PRILOSEC) 40 MG capsule Take 40 mg by mouth 2 (two) times daily.     [provider]      VITAL SIGNS:  Blood pressure (!) 163/79, pulse 63, temperature 98.1 F (36.7 C), temperature source Oral, resp. rate 19, height 5\' 1"  (1.549 m), weight 109.2 kg, SpO2 100%.  PHYSICAL EXAMINATION:  Physical Exam  GENERAL:  89 y.o.-year-old Caucasian female patient lying in the bed with no acute distress.  EYES: Pupils equal, round, reactive to light and accommodation. No scleral icterus. Extraocular muscles intact.  HEENT: Head atraumatic, normocephalic. Oropharynx and nasopharynx clear.  NECK:  Supple, no jugular venous distention. No thyroid enlargement, no tenderness.  LUNGS: Normal breath sounds bilaterally, no wheezing, rales,rhonchi or crepitation. No use of accessory muscles of respiration.  CARDIOVASCULAR: Regular rate and rhythm, S1, S2 normal. No murmurs, rubs, or gallops.  ABDOMEN: Soft, nondistended, nontender. Bowel sounds present. No organomegaly or mass.  EXTREMITIES: No pedal edema, cyanosis, or clubbing.  NEUROLOGIC: Cranial  nerves II through XII are intact. Muscle strength 5/5 in all extremities. Sensation intact. Gait not checked.  PSYCHIATRIC: The patient is alert and oriented x 3.  Normal affect and good eye contact. SKIN: Large right forearm tear.  No other obvious rash, lesion, or ulcer.   LABORATORY PANEL:   CBC Recent Labs  Lab 08/16/23 0424  WBC 8.6  HGB 11.8*  HCT 35.2*  PLT 222   ------------------------------------------------------------------------------------------------------------------  Chemistries  Recent Labs  Lab 08/16/23 0000 08/16/23 0424  NA 135 137  K 3.6 3.9  CL 103 107  CO2 26 24  GLUCOSE 129* 108*  BUN 22 20  CREATININE 1.40* 1.20*  CALCIUM 8.8* 8.3*  AST 21  --   ALT 13  --   ALKPHOS 41  --   BILITOT 1.0  --    ------------------------------------------------------------------------------------------------------------------  Cardiac Enzymes No results for input(s): "TROPONINI" in the last 168 hours. ------------------------------------------------------------------------------------------------------------------  RADIOLOGY:  DG Forearm Right  Result Date: 08/16/2023 CLINICAL DATA:  Fall,  laceration to right forearm EXAM: RIGHT FOREARM - 2 VIEW COMPARISON:  None Available. FINDINGS: Soft tissue swelling along the posterior aspect of the forearm. No radiopaque foreign bodies. No acute bony abnormality. Specifically, no fracture, subluxation, or dislocation. IMPRESSION: Posterior soft tissue laceration and swelling. No acute bony abnormality. Electronically Signed   By: Charlett Nose M.D.   On: 08/16/2023 01:02   CT Head Wo Contrast  Result Date: 08/16/2023 CLINICAL DATA:  Fall.  Head trauma, minor (Age >= 65y) EXAM: CT HEAD WITHOUT CONTRAST TECHNIQUE: Contiguous axial images were obtained from the base of the skull through the vertex without intravenous contrast. RADIATION DOSE REDUCTION: This exam was performed according to the departmental dose-optimization  program which includes automated exposure control, adjustment of the mA and/or kV according to patient size and/or use of iterative reconstruction technique. COMPARISON:  None Available. FINDINGS: Brain: No acute intracranial abnormality. Specifically, no hemorrhage, hydrocephalus, mass lesion, acute infarction, or significant intracranial injury. Vascular: No hyperdense vessel or unexpected calcification. Skull: No acute calvarial abnormality. Sinuses/Orbits: No acute findings Other: None IMPRESSION: No acute intracranial abnormality. Electronically Signed   By: Charlett Nose M.D.   On: 08/16/2023 00:55      IMPRESSION AND PLAN:  Assessment and Plan: * NSTEMI (non-ST elevated myocardial infarction) [facility]) - The patient will be admitted to a progressive observation bed. - We will continue him on on IV heparin. - Will obtain a 2D echo and cardiology consult. - I notified Dr. Melton Alar about the patient. - She will be continued on aspirin as well as Plavix, statin therapy, and ARB therapy as well as beta-blocker therapy.  AKI (acute kidney injury) (HCC) - This is likely prerenal due to volume depletion and dehydration with subsequent orthostatic static hypotension. - We will continue hydration with IV normal saline. - We will check her orthostatics.  Dizziness - This could have been associated with syncope due to orthostatic hypotension. - We will add D-dimer and if elevated obtain a chest CTA to rule out PE acutely contributing to elevated troponin. - Management as above with hydration and will recheck orthostatics.  Essential hypertension - We will continue her antihypertensive therapy.  Peripheral neuropathy - We will continue Neurontin.  GERD without esophagitis - We will continue PPI therapy.  Dyslipidemia - We will continue statin therapy.   DVT prophylaxis: IV heparin Advanced Care Planning:  Code Status: The patient is DNR and DNI. Family Communication:  The plan of care was  discussed in details with the patient (and family). I answered all questions. The patient agreed to proceed with the above mentioned plan. Further management will depend upon hospital course. Disposition Plan: Back to previous home environment Consults called: Cardiology All the records are reviewed and case discussed with ED provider.  Status is: Observation  I certify that at the time of admission, it is my clinical judgment that the patient will require  hospital care extending less than 2 midnights.                            Dispo: The patient is from: Home              Anticipated d/c is to: Home              Patient currently is not medically stable to d/c.              Difficult to place patient: No  Taisia Fantini A Tachina Spoonemore M.D  on 08/16/2023 at 5:46 AM  [facility]   From 7 PM-7 AM, contact night-coverage [email or web address]  CC: Primary care physician; Lauro Regulus, MD

## 2023-08-16 NOTE — Assessment & Plan Note (Signed)
-   We will continue PPI therapy 

## 2023-08-16 NOTE — Assessment & Plan Note (Signed)
Resolved with IV hydration. Most likely prerenal due to volume depletion and dehydration. --Follow BMP's at follow up --Encourage hydration

## 2023-08-16 NOTE — Assessment & Plan Note (Signed)
Elevated troponin --Cardiology consulted --Treated with IV heparin --Continue home cardiac meds --Echo shows preserved EF, grade II diastolic dysfunction --Nuc stress test was normal --Follow up with Dr. Melton Alar, Cardiology in 1-2 weeks --14 cardiac monitor placed for further evaluation into apparent recurrent syncopal episodes

## 2023-08-16 NOTE — ED Notes (Signed)
Spoke to floor staff about patient's care handoff at this time.

## 2023-08-16 NOTE — Assessment & Plan Note (Signed)
-   We will continue Neurontin. ?

## 2023-08-16 NOTE — Assessment & Plan Note (Signed)
--  Continue home regimen --If positive orthostatics, may need regimen adjusted

## 2023-08-16 NOTE — ED Notes (Signed)
Advised nurse that patient has ready bed 

## 2023-08-17 ENCOUNTER — Observation Stay: Payer: Medicare HMO

## 2023-08-17 ENCOUNTER — Observation Stay
Admit: 2023-08-17 | Discharge: 2023-08-17 | Disposition: A | Discharge status: Home / Self Care | Payer: Medicare HMO | Attending: Student | Admitting: Student

## 2023-08-17 DIAGNOSIS — I214 Non-ST elevation (NSTEMI) myocardial infarction: Secondary | ICD-10-CM

## 2023-08-17 LAB — BASIC METABOLIC PANEL
Anion gap: 11 (ref 5–15)
BUN: 14 mg/dL (ref 8–23)
CO2: 21 mmol/L — ABNORMAL LOW (ref 22–32)
Calcium: 8.2 mg/dL — ABNORMAL LOW (ref 8.9–10.3)
Chloride: 109 mmol/L (ref 98–111)
Creatinine, Ser: 0.99 mg/dL (ref 0.44–1.00)
GFR, Estimated: 55 mL/min — ABNORMAL LOW (ref >60)
Glucose, Bld: 89 mg/dL (ref 70–99)
Potassium: 3.7 mmol/L (ref 3.5–5.1)
Sodium: 141 mmol/L (ref 135–145)

## 2023-08-17 LAB — ECHOCARDIOGRAM COMPLETE
AR max vel: 1.96 cm2
AV Area VTI: 1.8 cm2
AV Area mean vel: 1.82 cm2
AV Mean grad: 3 mmHg
AV Peak grad: 6.2 mmHg
Ao pk vel: 1.24 m/s
Area-P 1/2: 4.96 cm2
Height: 61 in
MV VTI: 1.43 cm2
S' Lateral: 2.1 cm
Weight: 3851.88 oz

## 2023-08-17 LAB — CBC
HCT: 28.4 % — ABNORMAL LOW (ref 36.0–46.0)
Hemoglobin: 9.9 g/dL — ABNORMAL LOW (ref 12.0–15.0)
MCH: 32.9 pg (ref 26.0–34.0)
MCHC: 34.9 g/dL (ref 30.0–36.0)
MCV: 94.4 fL (ref 80.0–100.0)
Platelets: 185 10*3/uL (ref 150–400)
RBC: 3.01 MIL/uL — ABNORMAL LOW (ref 3.87–5.11)
RDW: 12.2 % (ref 11.5–15.5)
WBC: 7.8 10*3/uL (ref 4.0–10.5)
nRBC: 0 % (ref 0.0–0.2)

## 2023-08-17 LAB — HEPARIN LEVEL (UNFRACTIONATED): Heparin Unfractionated: 0.72 [IU]/mL — ABNORMAL HIGH (ref 0.30–0.70)

## 2023-08-17 MED ORDER — REGADENOSON 0.4 MG/5ML IV SOLN
0.4000 mg | Freq: Once | INTRAVENOUS | Status: AC
  Administered 2023-08-17: 0.4 mg via INTRAVENOUS

## 2023-08-17 MED ORDER — CEPHALEXIN 500 MG PO CAPS
500.0000 mg | ORAL_CAPSULE | Freq: Four times a day (QID) | ORAL | Status: DC
  Administered 2023-08-17 – 2023-08-22 (×19): 500 mg via ORAL
  Filled 2023-08-17 (×19): qty 1

## 2023-08-17 MED ORDER — TECHNETIUM TC 99M TETROFOSMIN IV KIT
29.8400 | PACK | Freq: Once | INTRAVENOUS | Status: AC | PRN
  Administered 2023-08-17: 29.84 via INTRAVENOUS

## 2023-08-17 MED ORDER — CLOPIDOGREL BISULFATE 75 MG PO TABS
75.0000 mg | ORAL_TABLET | Freq: Every day | ORAL | Status: DC
  Administered 2023-08-18 – 2023-08-22 (×5): 75 mg via ORAL
  Filled 2023-08-17 (×6): qty 1

## 2023-08-17 MED ORDER — DOXYCYCLINE HYCLATE 100 MG PO TABS
100.0000 mg | ORAL_TABLET | Freq: Two times a day (BID) | ORAL | Status: AC
  Administered 2023-08-17 – 2023-08-22 (×10): 100 mg via ORAL
  Filled 2023-08-17 (×10): qty 1

## 2023-08-17 MED ORDER — TECHNETIUM TC 99M TETROFOSMIN IV KIT
10.0000 | PACK | Freq: Once | INTRAVENOUS | Status: AC | PRN
  Administered 2023-08-17: 9.89 via INTRAVENOUS

## 2023-08-17 NOTE — Progress Notes (Signed)
Patient Right hand is swollen, 3+ edema. Her hand was not edematous at the shift end this morning. Elevated on a pillow.  Patient is confused and hallucinating that she is at home. Will not stay in the bed. Does not remember that her granddaughter just left her at 7 pm.  Reoriented multiple times.  I will be sitting outside of her room for the shift.

## 2023-08-17 NOTE — Plan of Care (Signed)

## 2023-08-17 NOTE — Progress Notes (Signed)
*  PRELIMINARY RESULTS* Echocardiogram 2D Echocardiogram has been performed.  Cristela Blue 08/17/2023, 8:55 AM

## 2023-08-17 NOTE — TOC Transition Note (Signed)
Transition of Care [facility]) - CM/SW Discharge Note   Patient Details  Name: Sandra Delgado MRN: [id number] Date of Birth: 12-15-1933  [facility] CM/SW Contact:  Truddie Hidden, RN Phone Number: 08/17/2023, 4:20 PM   Clinical Narrative:    Spoke with patient and her son at bedside regarding PT/ OT. She is agreeable to [facility] PT/ OT. They did not havea choice other than an agency accepted by her insurance.  Referral sent and accepted by Coralee North from [place].  Patient's son will transport her home.   TOC signing off.          Patient Goals and CMS Choice      Discharge Placement                         Discharge Plan and Services Additional resources added to the After Visit Summary for                                       Social Determinants of Health (SDOH) Interventions SDOH Screenings   Food Insecurity: No Food Insecurity (04/16/2023)   Received from [facility]  Transportation Needs: No Transportation Needs (04/16/2023)   Received from [facility]  Utilities: Not At Risk (04/16/2023)   Received from [facility]  Financial Resource Strain: Low Risk  (04/16/2023)   Received from [facility]  Tobacco Use: Low Risk  (08/15/2023)     Readmission Risk Interventions     No data to display

## 2023-08-17 NOTE — Progress Notes (Cosign Needed Addendum)
[facility] CARDIOLOGY CONSULT NOTE       Patient ID: SARE JIMMY MRN: [id number] DOB/AGE: 87-Dec-1934 87 y.o.  Admit date: 08/15/2023 Referring Physician Dr Chipper Herb Primary Cardiologist Dr Gwen Pounds (retired) Reason for Consultation troponin elevation  HPI: Zurisadai Donabedian Maduro is a 89 y.o. female  with a past medical history of hypertension, hyperlipidemia, and COPD who presented to the ED on 08/15/2023 for syncope. Cardiology consulted for further evaluation of syncope, troponin elevation.   Interval history:  -Patient feeling well this AM, remains without complaints. Denies dizziness, chest pain, palpitations.  -Echo results discussed with patient, she will be going for nuclear stress this morning.  -Remains in NSR on telemetry with no noted arrhythmias.    Review of systems complete and found to be negative unless listed above     Past Medical History:  Diagnosis Date   Arthritis    CKD (chronic kidney disease) stage 3, GFR 30-59 ml/min (HCC)    COPD (chronic obstructive pulmonary disease) (HCC)    HLD (hyperlipidemia)    HTN (hypertension)    IBS (irritable bowel syndrome)    Lymphoma (HCC)    Migraine     Past Surgical History:  Procedure Laterality Date   ABDOMINAL HYSTERECTOMY     APPENDECTOMY     COLONOSCOPY     LEFT HEART CATH AND CORONARY ANGIOGRAPHY N/A 10/07/2017   Procedure: LEFT HEART CATH AND CORONARY ANGIOGRAPHY;  Surgeon: Dalia Heading, MD;  Location: [facility] INVASIVE CV LAB;  Service: Cardiovascular;  Laterality: N/A;    Medications Prior to Admission  Medication Sig Dispense Refill Last Dose   atorvastatin (LIPITOR) 40 MG tablet Take 1 tablet (40 mg total) by mouth daily at 6 PM. 30 tablet 0 08/15/2023   carvedilol (COREG) 3.125 MG tablet Take 3.125 mg by mouth 2 (two) times daily with a meal.   08/15/2023 at 2000   clopidogrel (PLAVIX) 75 MG tablet Take 75 mg by mouth daily.   08/15/2023   gabapentin (NEURONTIN) 300 MG capsule Take 300 mg by mouth at  bedtime.   08/15/2023 at 2000   losartan (COZAAR) 100 MG tablet Take 100 mg by mouth daily.   08/15/2023   omeprazole (PRILOSEC) 40 MG capsule Take 40 mg by mouth 2 (two) times daily.    08/15/2023   pravastatin (PRAVACHOL) 80 MG tablet 40 mg at bedtime.      aspirin EC 81 MG EC tablet Take 1 tablet (81 mg total) by mouth daily. (Patient not taking: Reported on 08/16/2023) 30 tablet 0 Not Taking   Social History   Socioeconomic History   Marital status: Widowed    Spouse name: Not on file   Number of children: Not on file   Years of education: Not on file   Highest education level: Not on file  Occupational History   Not on file  Tobacco Use   Smoking status: Never   Smokeless tobacco: Never  Substance and Sexual Activity   Alcohol use: Yes    Alcohol/week: 0.0 standard drinks of alcohol    Comment: occassional   Drug use: No   Sexual activity: Not on file  Other Topics Concern   Not on file  Social History Narrative   Lives at home by herself. Independent at baseline.   Social Determinants of Health   Financial Resource Strain: Low Risk  (04/16/2023)   Received from [facility]   Overall Financial Resource Strain (CARDIA)    Difficulty of Paying Living Expenses:  Not very hard  Food Insecurity: No Food Insecurity (04/16/2023)   Received from [facility]   Hunger Vital Sign    Worried About Running Out of Food in the Last Year: Never true    Ran Out of Food in the Last Year: Never true  Transportation Needs: No Transportation Needs (04/16/2023)   Received from [facility] - Transportation    In the past 12 months, has lack of transportation kept you from medical appointments or from getting medications?: No    Lack of Transportation (Non-Medical): No  Physical Activity: Not on file  Stress: Not on file  Social Connections: Not on file  Intimate Partner Violence: Unknown (08/17/2023)   Humiliation, Afraid, Rape,  and Kick questionnaire    Fear of Current or Ex-Partner: No    Emotionally Abused: No    Physically Abused: Not on file    Sexually Abused: No    Family History  Problem Relation Age of Onset   Colon cancer Mother      Vitals:   08/16/23 1900 08/16/23 2030 08/17/23 0010 08/17/23 0830  BP: (!) 147/62 (!) 138/56 (!) 134/54 (!) 150/68  Pulse: 62 64 63 91  Resp: (!) 23 20    Temp:  98.5 F (36.9 C) 98.7 F (37.1 C)   TempSrc:  Oral Oral   SpO2: 99% 98% 98% 98%  Weight:      Height:        PHYSICAL EXAM General: Well-appearing elderly female, awake and alert, well nourished, in no acute distress sitting upright in hospital bed with son at bedside. HEENT: Normocephalic and atraumatic. Neck: No JVD.  Lungs: Normal respiratory effort on room air. Clear bilaterally to auscultation. No wheezes, crackles, rhonchi.  Heart: HRRR. Normal S1 and S2 without gallops or murmurs.  Abdomen: Non-distended appearing.  Msk: Normal strength and tone for age. Extremities: Warm and well perfused. No clubbing, cyanosis. no edema.  Neuro: Alert and oriented X 3. Psych: Answers questions appropriately.   Labs: Basic Metabolic Panel: Recent Labs    08/16/23 0424 08/17/23 0502  NA 137 141  K 3.9 3.7  CL 107 109  CO2 24 21*  GLUCOSE 108* 89  BUN 20 14  CREATININE 1.20* 0.99  CALCIUM 8.3* 8.2*   Liver Function Tests: Recent Labs    08/16/23 0000  AST 21  ALT 13  ALKPHOS 41  BILITOT 1.0  PROT 6.8  ALBUMIN 3.8   No results for input(s): "LIPASE", "AMYLASE" in the last 72 hours. CBC: Recent Labs    08/16/23 0000 08/16/23 0424 08/17/23 0502  WBC 7.2 8.6 7.8  NEUTROABS 3.9  --   --   HGB 12.8 11.8* 9.9*  HCT 38.4 35.2* 28.4*  MCV 95.3 96.4 94.4  PLT 230 222 185   Cardiac Enzymes: Recent Labs    08/16/23 0232 08/16/23 0856 08/16/23 1858  TROPONINIHS 129* 143* 68*   BNP: No results for input(s): "BNP" in the last 72 hours. D-Dimer: Recent Labs    08/16/23 0424   DDIMER 0.62*   Hemoglobin A1C: No results for input(s): "HGBA1C" in the last 72 hours. Fasting Lipid Panel: Recent Labs    08/16/23 0424  CHOL 141  HDL 55  LDLCALC 67  TRIG 96  CHOLHDL 2.6   Thyroid Function Tests: No results for input(s): "TSH", "T4TOTAL", "T3FREE", "THYROIDAB" in the last 72 hours.  Invalid input(s): "FREET3" Anemia Panel: No results for input(s): "VITAMINB12", "FOLATE", "FERRITIN", "TIBC", "IRON", "  RETICCTPCT" in the last 72 hours.   Radiology: ECHOCARDIOGRAM COMPLETE  Result Date: 08/17/2023    ECHOCARDIOGRAM REPORT   Patient Name:   AFTIN LA Suppes Date of Exam: 08/17/2023 Medical Rec #:  [id number]      Height:       61.0 in Accession #:    [id number]     Weight:       240.7 lb Date of Birth:  Jul 14, 1933      BSA:          2.044 m Patient Age:    89 years       BP:           134/54 mmHg Patient Gender: F              HR:           63 bpm. Exam Location:  [facility] Procedure: 2D Echo, Cardiac Doppler and Color Doppler Indications:     NSTEMI I21.4  History:         Patient has no prior history of Echocardiogram examinations.                  COPD; Risk Factors:Hypertension and Dyslipidemia.  Sonographer:     Cristela Blue Referring Phys:  [id number] Emerson Schreifels Diagnosing Phys: Rozell Searing Custovic IMPRESSIONS  1. Left ventricular ejection fraction, by estimation, is 65 to 70%. The left ventricle has normal function. The left ventricle has no regional wall motion abnormalities. Left ventricular diastolic parameters are consistent with Grade II diastolic dysfunction (pseudonormalization).  2. Right ventricular systolic function is normal. The right ventricular size is normal.  3. The mitral valve is grossly normal. Trivial mitral valve regurgitation.  4. The aortic valve is grossly normal. Aortic valve regurgitation is not visualized. No aortic stenosis is present. FINDINGS  Left Ventricle: Left ventricular ejection fraction, by estimation, is 65 to 70%. The left ventricle has normal  function. The left ventricle has no regional wall motion abnormalities. The left ventricular internal cavity size was normal in size. There is  no left ventricular hypertrophy. Left ventricular diastolic parameters are consistent with Grade II diastolic dysfunction (pseudonormalization). Right Ventricle: The right ventricular size is normal. No increase in right ventricular wall thickness. Right ventricular systolic function is normal. Left Atrium: Left atrial size was normal in size. Right Atrium: Right atrial size was normal in size. Pericardium: There is no evidence of pericardial effusion. Mitral Valve: The mitral valve is grossly normal. Trivial mitral valve regurgitation. MV peak gradient, 8.0 mmHg. The mean mitral valve gradient is 4.0 mmHg. Tricuspid Valve: The tricuspid valve is grossly normal. Tricuspid valve regurgitation is trivial. Aortic Valve: The aortic valve is grossly normal. Aortic valve regurgitation is not visualized. No aortic stenosis is present. Aortic valve mean gradient measures 3.0 mmHg. Aortic valve peak gradient measures 6.2 mmHg. Aortic valve area, by VTI measures 1.80 cm. Pulmonic Valve: The pulmonic valve was grossly normal. Pulmonic valve regurgitation is not visualized. Aorta: The aortic root is normal in size and structure. IAS/Shunts: No atrial level shunt detected by color flow Doppler.  LEFT VENTRICLE PLAX 2D LVIDd:         3.80 cm   Diastology LVIDs:         2.10 cm   LV e' medial:    5.33 cm/s LV PW:         1.10 cm   LV E/e' medial:  17.1 LV IVS:        0.90 cm  LV e' lateral:   7.51 cm/s LVOT diam:     2.00 cm   LV E/e' lateral: 12.1 LV SV:         48 LV SV Index:   24 LVOT Area:     3.14 cm  RIGHT VENTRICLE RV Basal diam:  3.10 cm RV Mid diam:    1.40 cm LEFT ATRIUM             Index       RIGHT ATRIUM          Index LA diam:        2.00 cm 0.98 cm/m  RA Area:     8.52 cm LA Vol (A2C):   18.5 ml 9.05 ml/m  RA Volume:   16.90 ml 8.27 ml/m LA Vol (A4C):   13.5 ml 6.61  ml/m LA Biplane Vol: 17.0 ml 8.32 ml/m  AORTIC VALVE AV Area (Vmax):    1.96 cm AV Area (Vmean):   1.82 cm AV Area (VTI):     1.80 cm AV Vmax:           124.00 cm/s AV Vmean:          85.200 cm/s AV VTI:            0.267 m AV Peak Grad:      6.2 mmHg AV Mean Grad:      3.0 mmHg LVOT Vmax:         77.40 cm/s LVOT Vmean:        49.400 cm/s LVOT VTI:          0.153 m LVOT/AV VTI ratio: 0.57  AORTA Ao Root diam: 3.10 cm MITRAL VALVE                TRICUSPID VALVE MV Area (PHT): 4.96 cm     TR Peak grad:   32.9 mmHg MV Area VTI:   1.43 cm     TR Vmax:        287.00 cm/s MV Peak grad:  8.0 mmHg MV Mean grad:  4.0 mmHg     SHUNTS MV Vmax:       1.41 m/s     Systemic VTI:  0.15 m MV Vmean:      98.9 cm/s    Systemic Diam: 2.00 cm MV Decel Time: 153 msec MV E velocity: 91.20 cm/s MV A velocity: 120.00 cm/s MV E/A ratio:  0.76 Sabina Custovic Electronically signed by Clotilde Dieter Signature Date/Time: 08/17/2023/9:46:23 AM    Final    CT Angio Chest Pulmonary Embolism (PE) W or WO Contrast  Result Date: 08/16/2023 CLINICAL DATA:  89 year old female with history of syncope. Evaluate for pulmonary embolism. EXAM: CT ANGIOGRAPHY CHEST WITH CONTRAST TECHNIQUE: Multidetector CT imaging of the chest was performed using the standard protocol during bolus administration of intravenous contrast. Multiplanar CT image reconstructions and MIPs were obtained to evaluate the vascular anatomy. RADIATION DOSE REDUCTION: This exam was performed according to the departmental dose-optimization program which includes automated exposure control, adjustment of the mA and/or kV according to patient size and/or use of iterative reconstruction technique. CONTRAST:  75mL OMNIPAQUE IOHEXOL 350 MG/ML SOLN COMPARISON:  CT of the chest, abdomen and pelvis 05/21/2006. FINDINGS: Cardiovascular: No filling defect within the pulmonary arterial tree to suggest pulmonary embolism. Heart size is normal. There is no significant pericardial fluid,  thickening or pericardial calcification. There is aortic atherosclerosis, as well as atherosclerosis of the great vessels of the mediastinum and the coronary  arteries, including calcified atherosclerotic plaque in the left main, left anterior descending and right coronary arteries. Mediastinum/Nodes: No pathologically enlarged mediastinal or hilar lymph nodes. Esophagus is unremarkable in appearance. No axillary lymphadenopathy. Lungs/Pleura: No acute consolidative airspace disease. No pleural effusions. No suspicious appearing pulmonary nodules or masses are noted. Upper Abdomen: Aortic atherosclerosis. Musculoskeletal: There are no aggressive appearing lytic or blastic lesions noted in the visualized portions of the skeleton. Review of the MIP images confirms the above findings. IMPRESSION: 1. No acute findings in the thorax to account for the patient's symptoms. Specifically, no evidence of pulmonary embolism. 2. Aortic atherosclerosis, in addition to left main and 2 vessel coronary artery disease. Aortic Atherosclerosis (ICD10-I70.0). Electronically Signed   By: Trudie Reed M.D.   On: 08/16/2023 06:41   DG Pelvis 1-2 Views  Result Date: 08/16/2023 CLINICAL DATA:  Fall, dizziness EXAM: PELVIS - 1-2 VIEW COMPARISON:  None Available. FINDINGS: There is no evidence of pelvic fracture or diastasis. No pelvic bone lesions are seen. IMPRESSION: Negative. Electronically Signed   By: Charlett Nose M.D.   On: 08/16/2023 01:04   DG Chest 1 View  Result Date: 08/16/2023 CLINICAL DATA:  Fall, dizziness EXAM: CHEST  1 VIEW COMPARISON:  10/06/2017 FINDINGS: Heart and mediastinal contours are within normal limits. No focal opacities or effusions. No acute bony abnormality. Aortic atherosclerosis. IMPRESSION: No active cardiopulmonary disease. Electronically Signed   By: Charlett Nose M.D.   On: 08/16/2023 01:03   DG Forearm Right  Result Date: 08/16/2023 CLINICAL DATA:  Fall, laceration to right forearm EXAM:  RIGHT FOREARM - 2 VIEW COMPARISON:  None Available. FINDINGS: Soft tissue swelling along the posterior aspect of the forearm. No radiopaque foreign bodies. No acute bony abnormality. Specifically, no fracture, subluxation, or dislocation. IMPRESSION: Posterior soft tissue laceration and swelling. No acute bony abnormality. Electronically Signed   By: Charlett Nose M.D.   On: 08/16/2023 01:02   CT Head Wo Contrast  Result Date: 08/16/2023 CLINICAL DATA:  Fall.  Head trauma, minor (Age >= 65y) EXAM: CT HEAD WITHOUT CONTRAST TECHNIQUE: Contiguous axial images were obtained from the base of the skull through the vertex without intravenous contrast. RADIATION DOSE REDUCTION: This exam was performed according to the departmental dose-optimization program which includes automated exposure control, adjustment of the mA and/or kV according to patient size and/or use of iterative reconstruction technique. COMPARISON:  None Available. FINDINGS: Brain: No acute intracranial abnormality. Specifically, no hemorrhage, hydrocephalus, mass lesion, acute infarction, or significant intracranial injury. Vascular: No hyperdense vessel or unexpected calcification. Skull: No acute calvarial abnormality. Sinuses/Orbits: No acute findings Other: None IMPRESSION: No acute intracranial abnormality. Electronically Signed   By: Charlett Nose M.D.   On: 08/16/2023 00:55    ECHO pending  TELEMETRY reviewed by me [facility]) 08/17/2023: NSR rate 70s  EKG reviewed by me: normal sinus rhythm, rate 74 bpm, with LBBB.  Data reviewed by me [facility]) 08/17/2023: last 24h vitals tele labs imaging I/O hospitalist progress notes  Principal Problem:   NSTEMI (non-ST elevated myocardial infarction) (HCC) Active Problems:   AKI (acute kidney injury) (HCC)   Dizziness   Dyslipidemia   GERD without esophagitis   Peripheral neuropathy   Essential hypertension    ASSESSMENT AND PLAN:  Hieu Krish Avena is a 89 y.o. female  with a past medical history of  hypertension, hyperlipidemia, and COPD who presented to the ED on 08/15/2023 for syncope. Cardiology consulted for further evaluation of syncope, troponin elevation.   NSTEMI suspect Type  II Patient walks 3x/week [facility]mill [facility] and she has no anginal symptoms with activity. Troponins 25 > 129 > 143 > 68. Presentation most consistent with demand/supply mismatch and not ACS. No EKG changes, no dynamic changes on telemetry. Continue heparin infusion.  Continue home cardiac medications. Echo with preserved EF, grade II diastolic dysfunction Stress test ordered, if normal, patient is stable for discharge tomorrow from cardiac standpoint. Follow-up in office with Dr. Melton Alar in 1-2 weeks. Discussed with patient and family, all in agreement to take more conservative approach rather than go for cardiac catheterization.   Syncope Will place 14 day cardiac monitor prior to hospital discharge. Suspect syncope is due to orthostatic hypotension however given her age, we will rule out high degree heart block. Patient stable for d/c from cardiac standpoint if stress test is normal.   HTN Continue current cardiac medications. Encourage low-sodium diet, less than 2000 mg daily.   HLD Continue Lipitor 80 mg at bedtime   This patient's plan of care was discussed and created with Dr. Darrold Junker and he is in agreement.   Signed: Gale Journey, PA-C  08/17/2023, 10:19 AM [facility]

## 2023-08-17 NOTE — Progress Notes (Signed)
Changed dressing to patients right arm. Wound is thick with fat layer exposed . Covered with xeroform, dry gauze and ace wrap. Patient tolerated well.  Will notifiy RN and MD

## 2023-08-17 NOTE — Progress Notes (Signed)
ANTICOAGULATION CONSULT NOTE -   Pharmacy Consult for Heparin  Indication: chest pain/ACS  Allergies  Allergen Reactions   Ace Inhibitors Other (See Comments)    Reaction: unknown   Amoxicillin Diarrhea    Has patient had a PCN reaction causing immediate rash, facial/tongue/throat swelling, SOB or lightheadedness with hypotension: No Has patient had a PCN reaction causing severe rash involving mucus membranes or skin necrosis: No Has patient had a PCN reaction that required hospitalization No Has patient had a PCN reaction occurring within the last 10 years: Yes If all of the above answers are "NO", then may proceed with Cephalosporin use.    Atorvastatin Other (See Comments)    Reaction: muscle pain   Ezetimibe Other (See Comments)    Reaction: fatigue   Niacin And Related Other (See Comments)    Reaction: flushing    Patient Measurements: Height: 5\' 1"  (154.9 cm) Weight: 109.2 kg (240 lb 11.9 oz) IBW/kg (Calculated) : 47.8 Heparin Dosing Weight: 74.6 kg   Vital Signs: Temp: 98.7 F (37.1 C) (08/26 0010) Temp Source: Oral (08/26 0010) BP: 150/68 (08/26 0830) Pulse Rate: 91 (08/26 0830)  Labs: Recent Labs    08/16/23 0000 08/16/23 0232 08/16/23 0424 08/16/23 0856 08/16/23 1306 08/16/23 1858 08/16/23 2208 08/17/23 0502 08/17/23 0704  HGB 12.8  --  11.8*  --   --   --   --  9.9*  --   HCT 38.4  --  35.2*  --   --   --   --  28.4*  --   PLT 230  --  222  --   --   --   --  185  --   APTT  --   --  26  --   --   --   --   --   --   LABPROT  --   --  14.3  --   --   --   --   --   --   INR  --   --  1.1  --   --   --   --   --   --   HEPARINUNFRC  --   --   --   --  >1.10*  --  0.83*  --  0.72*  CREATININE 1.40*  --  1.20*  --   --   --   --  0.99  --   TROPONINIHS 25* 129*  --  143*  --  68*  --   --   --     Estimated Creatinine Clearance: 44.9 mL/min (by C-G formula based on SCr of 0.99 mg/dL).   Medical History: Past Medical History:  Diagnosis Date    Arthritis    CKD (chronic kidney disease) stage 3, GFR 30-59 ml/min (HCC)    COPD (chronic obstructive pulmonary disease) (HCC)    HLD (hyperlipidemia)    HTN (hypertension)    IBS (irritable bowel syndrome)    Lymphoma (HCC)    Migraine     Medications:  Medications Prior to Admission  Medication Sig Dispense Refill Last Dose   atorvastatin (LIPITOR) 40 MG tablet Take 1 tablet (40 mg total) by mouth daily at 6 PM. 30 tablet 0 08/15/2023   carvedilol (COREG) 3.125 MG tablet Take 3.125 mg by mouth 2 (two) times daily with a meal.   08/15/2023 at 2000   clopidogrel (PLAVIX) 75 MG tablet Take 75 mg by mouth daily.   08/15/2023   gabapentin (  NEURONTIN) 300 MG capsule Take 300 mg by mouth at bedtime.   08/15/2023 at 2000   losartan (COZAAR) 100 MG tablet Take 100 mg by mouth daily.   08/15/2023   omeprazole (PRILOSEC) 40 MG capsule Take 40 mg by mouth 2 (two) times daily.    08/15/2023   pravastatin (PRAVACHOL) 80 MG tablet 40 mg at bedtime.      aspirin EC 81 MG EC tablet Take 1 tablet (81 mg total) by mouth daily. (Patient not taking: Reported on 08/16/2023) 30 tablet 0 Not Taking    Assessment: Pharmacy consulted to dose heparin in this 89 year old female admitted with ACS/NSTEMI.  No prior anticoag noted.  Serum creatinine: 0.99 mg/dL 40/98/11 9147 Estimated creatinine clearance: 44.9 mL/min   8/25 1306 HL >1.10,  SUPRAtherapeutic 8/25 2208 HL 0.83, SUPRAtherapeutic  8/26 0704 HL 0.72, SUPRAtherapeutic   Goal of Therapy:  Heparin level 0.3-0.7 units/ml Monitor platelets by anticoagulation protocol: Yes   Plan:  HL slightly supratherapeutic at 0.72 on 600 units/hr Will decrease heparin rate to 550 units/hr Will recheck another HL in 8 hours Continue to monitor CBC daily   Merryl Hacker, PharmD Clinical Pharmacist 08/17/2023,8:51 AM

## 2023-08-17 NOTE — Progress Notes (Signed)
Transition of Care [facility]) - Inpatient Brief Assessment   Patient Details  Name: Sandra Delgado MRN: [id number] Date of Birth: Sep 11, 1933  Transition of [facility] CM/SW Contact:    Truddie Hidden, RN Phone Number: 08/17/2023, 9:25 AM   Clinical Narrative: TOC continuing ongoing assessments for needs that may arise and changes in discharge plan.   Transition of Care Asessment: Insurance and Status: Insurance coverage has been reviewed Patient has primary care physician: Yes Home environment has been reviewed: Return to home Prior level of function:: Independent Prior/Current Home Services: No current home services Social Determinants of Health Reivew: SDOH reviewed no interventions necessary Readmission risk has been reviewed: Yes Transition of care needs: no transition of care needs at this time

## 2023-08-17 NOTE — Progress Notes (Incomplete)
Progress Note   Patient: Sandra Delgado AOZ:[id number] DOB: 08-23-1933 DOA: 08/15/2023     0 DOS: the patient was seen and examined on 08/18/2023   Brief hospital course:  From HPI on admission: "Sandra Delgado is a 89 y.o. Caucasian female with medical history significant for COPD, hypertension, dyslipidemia, IBS, lymphoma, migraine and osteoarthritis, who presented to the emergency room with acute onset of dizziness when she went to the bathroom with possibly presyncope.  She thought that she fell down without having head injury.  She had similar episode last week when she actually hit her head.  This time she had significant right forearm tear.  "   ED course notable for elevated BP 182/70, labile HR 58 >> 119 >> 60, RR 22.   She was reportedly orthostatic in the ED, given IV fluids. Head CT non-acute.  CXR negative.  UA without infection.  Pelvic xray negative.   Pt had a right forearm skin tear and laceration that was sutured and dressed in the ED.   She was admitted due to elevated troponin with +delta on trending.  Cardiology was consulted and pt started on IV heparin.  Further hospital course and management as outlined below.    Assessment and Plan: * NSTEMI (non-ST elevated myocardial infarction) (HCC) Elevated troponin --Cardiology consulted --Treated with IV heparin --Continue home cardiac meds --Echo shows preserved EF, grade II diastolic dysfunction --Nuc stress test was normal --Follow up with Dr. Melton Alar, Cardiology in 1-2 weeks --14 cardiac monitor placed for further evaluation into apparent recurrent syncopal episodes  AKI (acute kidney injury) (HCC) Resolved with IV hydration. Most likely prerenal due to volume depletion and dehydration. --Follow BMP's at follow up --Encourage hydration  Dizziness Suspected syncope due to possible orthostatic hypotension. D-dimer was mildly elevated, thus CTA chest obtained with ruled out PE. --Monitor orthostatic  vitals --PT/OT evaluations --Telemetry --Fall precautions  Arm laceration, right, initial encounter Sustained during fall, apparent syncopal episode.  Sutured in the ED. --Wound care requested for d/c instructions --OT evaluation --Pain control as needed Right hand edema --Elevate right upper extremity --Avoid over-compression with dressings Right arm bleeding --Reported by RN this evening, had reportedly been ongoing while on heparin --Now off heparin - monitor closely --Hbg stable   Essential hypertension --Continue home regimen --If positive orthostatics, may need regimen adjusted  Peripheral neuropathy --Continue Neurontin  GERD without esophagitis --Continue PPI  Dyslipidemia --Continue statin        Subjective: Pt seen with son at bedside this AM.  She wants to go, says she feels well.  Is concerned about taking care of her arm at home, lives alone.  She is reluctant about stress test, stating she is not sure she wants to have any further tests.  Ultimately she agreed after explanation in detail.  No other acute complaints  This evening, nursing staff report pt's laceration had been bleeding earlier in the day.  I was not informed until this evening.  This was while patient was on heparin drip which had since been stopped.  Physical Exam: Vitals:   08/17/23 0010 08/17/23 0830 08/17/23 2015 08/18/23 0500  BP: (!) 134/54 (!) 150/68 (!) 147/66 (!) 134/56  Pulse: 63 91 87 68  Resp:   20   Temp: 98.7 F (37.1 C)  98.5 F (36.9 C) 98.1 F (36.7 C)  TempSrc: Oral  Oral Oral  SpO2: 98% 98%  98%  Weight:      Height:  General exam: awake, alert, no acute distress HEENT: atraumatic, clear conjunctiva, anicteric sclera, moist mucus membranes, hearing grossly normal  Respiratory system: CTAB no wheezes, rales or rhonchi, normal respiratory effort. Cardiovascular system: normal S1/S2, RRR, no JVD, murmurs, rubs, gallops,  no pedal edema.   Gastrointestinal  system: soft, NT, ND, no HSM felt, +bowel sounds. Central nervous system: A&O x 3. no gross focal neurologic deficits, normal speech Extremities: right forearm kerlix dressing appears clean dry intact, right hand distally is edematous but warm/normal temperature with intact sensation  Skin: dry, intact, normal temperature Psychiatry: normal mood, congruent affect, judgement and insight appear normal   Data Reviewed:  Notable labs -- Troponin trended down 143 >> 68 Bicarb 21, Cr recovered 1.20 >> 0.99, Ca 8.2, Hbg 9.9 from 11.8 suspect dilution.      Family Communication: son at bedside on rounds  Disposition: Status is: Observation The patient remains OBS appropriate and will d/c before 2 midnights. Remains admitted for close monitoring due to arm laceration bleeding, PT/OT evaluations to assess safety of d/c home as pt lives alone.   Planned Discharge Destination: Home with Home Health    Time spent: 52 minutes including time at bedside and coordinating care  Author: Pennie Banter, DO 08/18/2023 8:52 AM  For on call review [email or web address].

## 2023-08-17 NOTE — CV Procedure (Signed)
Nuclear stress test is normal without perfusion abnormality. Patient stable for discharge.  [name], DO 08/17/23 2:05 PM

## 2023-08-17 NOTE — Discharge Summary (Incomplete)
Physician Discharge Summary   Patient: Sandra Delgado MRN: [id number] DOB: 01-Mar-1933  Admit date:     08/15/2023  Discharge date: 08/17/2023  Discharge Physician: Sandra Delgado   PCP: Sandra Regulus, MD   Recommendations at discharge:  {Tip this will not be part of the note when signed- Example include specific recommendations for outpatient follow-up, pending tests to follow-up on. (Optional):26781}  Follow up with Cardiology Follow up with Primary Care within one week Monitor right arm laceration healing & remove sutures when appropriate Repeat CBC, BMP at follow up Follow results of ambulatory heart monitor placed on patient prior to hospital d/c  Discharge Diagnoses: Principal Problem:   NSTEMI (non-ST elevated myocardial infarction) (HCC) Active Problems:   AKI (acute kidney injury) (HCC)   Dizziness   Dyslipidemia   GERD without esophagitis   Peripheral neuropathy   Essential hypertension  Resolved Problems:   * No resolved hospital problems. [facility] Del[facility] Course: No notes on file  Assessment and Plan: * NSTEMI (non-ST elevated myocardial infarction) [facility]) - The patient will be admitted to a progressive observation bed. - We will continue him on on IV heparin. - Will obtain a 2D echo and cardiology consult. - I notified Sandra Delgado about the patient. - She will be continued on aspirin as well as Plavix, statin therapy, and ARB therapy as well as beta-blocker therapy.  AKI (acute kidney injury) (HCC) - This is likely prerenal due to volume depletion and dehydration with subsequent orthostatic static hypotension. - We will continue hydration with IV normal saline. - We will check her orthostatics.  Dizziness - This could have been associated with syncope due to orthostatic hypotension. - We will add D-dimer and if elevated obtain a chest CTA to rule out PE acutely contributing to elevated troponin. - Management as above with hydration and will recheck  orthostatics.  Essential hypertension - We will continue her antihypertensive therapy.  Peripheral neuropathy - We will continue Neurontin.  GERD without esophagitis - We will continue PPI therapy.  Dyslipidemia - We will continue statin therapy.      {Tip this will not be part of the note when signed Body mass index is 45.49 kg/m. , ,  (Optional):26781}  {(NOTE) Pain control PDMP Statment (Optional):26782} Consultants: *** Procedures performed: ***  Disposition: {Plan; Disposition:26390} Diet recommendation:  Discharge Diet Orders (From admission, onward)     Start     Ordered   08/17/23 0000  Diet - low sodium heart healthy        08/17/23 1447           {Diet_Plan:26776} DISCHARGE MEDICATION: Allergies as of 08/17/2023       Reactions   Ace Inhibitors Other (See Comments)   Reaction: unknown   Amoxicillin Diarrhea   Has patient had a PCN reaction causing immediate rash, facial/tongue/throat swelling, SOB or lightheadedness with hypotension: No Has patient had a PCN reaction causing severe rash involving mucus membranes or skin necrosis: No Has patient had a PCN reaction that required hospitalization No Has patient had a PCN reaction occurring within the last 10 years: Yes If all of the above answers are "NO", then may proceed with Cephalosporin use.   Atorvastatin Other (See Comments)   Reaction: muscle pain   Ezetimibe Other (See Comments)   Reaction: fatigue   Niacin And Related Other (See Comments)   Reaction: flushing        Medication List     TAKE these medications  aspirin EC 81 MG tablet Take 1 tablet (81 mg total) by mouth daily.   atorvastatin 40 MG tablet Commonly known as: LIPITOR Take 1 tablet (40 mg total) by mouth daily at 6 PM.   carvedilol 3.125 MG tablet Commonly known as: COREG Take 3.125 mg by mouth 2 (two) times daily with a meal.   clopidogrel 75 MG tablet Commonly known as: PLAVIX Take 75 mg by mouth daily.    gabapentin 300 MG capsule Commonly known as: NEURONTIN Take 300 mg by mouth at bedtime.   losartan 100 MG tablet Commonly known as: COZAAR Take 100 mg by mouth daily.   omeprazole 40 MG capsule Commonly known as: PRILOSEC Take 40 mg by mouth 2 (two) times daily.               Discharge Care Instructions  (From admission, onward)           Start     Ordered   08/17/23 0000  Discharge wound care:       Comments: As stated above   08/17/23 1447            Follow-up Information     Delgado, Sandra Searing, DO. Go in 2 week(s).   Specialty: Cardiology Contact information: [address] [place] [address] [phone]                Discharge Exam: Sandra Delgado Weights   08/15/23 2350  Weight: 109.2 kg   ***  Condition at discharge: {DC Condition:26389}  The results of significant diagnostics from this hospitalization (including imaging, microbiology, ancillary and laboratory) are listed below for reference.   Imaging Studies: ECHOCARDIOGRAM COMPLETE  Result Date: 08/17/2023    ECHOCARDIOGRAM REPORT   Patient Name:   Sandra Delgado Sandra Delgado Date of Exam: 08/17/2023 Medical Rec #:  [id number]      Height:       61.0 in Accession #:    [id number]     Weight:       240.7 lb Date of Birth:  July 23, 1933      BSA:          2.044 m Patient Age:    89 years       BP:           134/54 mmHg Patient Gender: F              HR:           63 bpm. Exam Location:  [facility] Procedure: 2D Echo, Cardiac Doppler and Color Doppler Indications:     NSTEMI I21.4  History:         Patient has no prior history of Echocardiogram examinations.                  COPD; Risk Factors:Hypertension and Dyslipidemia.  Sonographer:     Sandra Delgado Referring Phys:  [id number] Sandra Delgado Diagnosing Phys: Sandra Delgado IMPRESSIONS  1. Left ventricular ejection fraction, by estimation, is 65 to 70%. The left ventricle has normal function. The left ventricle has no regional wall motion abnormalities. Left  ventricular diastolic parameters are consistent with Grade II diastolic dysfunction (pseudonormalization).  2. Right ventricular systolic function is normal. The right ventricular size is normal.  3. The mitral valve is grossly normal. Trivial mitral valve regurgitation.  4. The aortic valve is grossly normal. Aortic valve regurgitation is not visualized. No aortic stenosis is present. FINDINGS  Left Ventricle: Left ventricular ejection fraction, by estimation, is 65 to 70%.  The left ventricle has normal function. The left ventricle has no regional wall motion abnormalities. The left ventricular internal cavity size was normal in size. There is  no left ventricular hypertrophy. Left ventricular diastolic parameters are consistent with Grade II diastolic dysfunction (pseudonormalization). Right Ventricle: The right ventricular size is normal. No increase in right ventricular wall thickness. Right ventricular systolic function is normal. Left Atrium: Left atrial size was normal in size. Right Atrium: Right atrial size was normal in size. Pericardium: There is no evidence of pericardial effusion. Mitral Valve: The mitral valve is grossly normal. Trivial mitral valve regurgitation. MV peak gradient, 8.0 mmHg. The mean mitral valve gradient is 4.0 mmHg. Tricuspid Valve: The tricuspid valve is grossly normal. Tricuspid valve regurgitation is trivial. Aortic Valve: The aortic valve is grossly normal. Aortic valve regurgitation is not visualized. No aortic stenosis is present. Aortic valve mean gradient measures 3.0 mmHg. Aortic valve peak gradient measures 6.2 mmHg. Aortic valve area, by VTI measures 1.80 cm. Pulmonic Valve: The pulmonic valve was grossly normal. Pulmonic valve regurgitation is not visualized. Aorta: The aortic root is normal in size and structure. IAS/Shunts: No atrial level shunt detected by color flow Doppler.  LEFT VENTRICLE PLAX 2D LVIDd:         3.80 cm   Diastology LVIDs:         2.10 cm   LV e'  medial:    5.33 cm/s LV PW:         1.10 cm   LV E/e' medial:  17.1 LV IVS:        0.90 cm   LV e' lateral:   7.51 cm/s LVOT diam:     2.00 cm   LV E/e' lateral: 12.1 LV SV:         48 LV SV Index:   24 LVOT Area:     3.14 cm  RIGHT VENTRICLE RV Basal diam:  3.10 cm RV Mid diam:    1.40 cm LEFT ATRIUM             Index       RIGHT ATRIUM          Index LA diam:        2.00 cm 0.98 cm/m  RA Area:     8.52 cm LA Vol (A2C):   18.5 ml 9.05 ml/m  RA Volume:   16.90 ml 8.27 ml/m LA Vol (A4C):   13.5 ml 6.61 ml/m LA Biplane Vol: 17.0 ml 8.32 ml/m  AORTIC VALVE AV Area (Vmax):    1.96 cm AV Area (Vmean):   1.82 cm AV Area (VTI):     1.80 cm AV Vmax:           124.00 cm/s AV Vmean:          85.200 cm/s AV VTI:            0.267 m AV Peak Grad:      6.2 mmHg AV Mean Grad:      3.0 mmHg LVOT Vmax:         77.40 cm/s LVOT Vmean:        49.400 cm/s LVOT VTI:          0.153 m LVOT/AV VTI ratio: 0.57  AORTA Ao Root diam: 3.10 cm MITRAL VALVE                TRICUSPID VALVE MV Area (PHT): 4.96 cm     TR Peak grad:   32.9 mmHg  MV Area VTI:   1.43 cm     TR Vmax:        287.00 cm/s MV Peak grad:  8.0 mmHg MV Mean grad:  4.0 mmHg     SHUNTS MV Vmax:       1.41 m/s     Systemic VTI:  0.15 m MV Vmean:      98.9 cm/s    Systemic Diam: 2.00 cm MV Decel Time: 153 msec MV E velocity: 91.20 cm/s MV A velocity: 120.00 cm/s MV E/A ratio:  0.76 Sabina Delgado Electronically signed by Clotilde Dieter Signature Date/Time: 08/17/2023/9:46:23 AM    Final    CT Angio Chest Pulmonary Embolism (PE) W or WO Contrast  Result Date: 08/16/2023 CLINICAL DATA:  89 year old female with history of syncope. Evaluate for pulmonary embolism. EXAM: CT ANGIOGRAPHY CHEST WITH CONTRAST TECHNIQUE: Multidetector CT imaging of the chest was performed using the standard protocol during bolus administration of intravenous contrast. Multiplanar CT image reconstructions and MIPs were obtained to evaluate the vascular anatomy. RADIATION DOSE REDUCTION: This  exam was performed according to the departmental dose-optimization program which includes automated exposure control, adjustment of the mA and/or kV according to patient size and/or use of iterative reconstruction technique. CONTRAST:  75mL OMNIPAQUE IOHEXOL 350 MG/ML SOLN COMPARISON:  CT of the chest, abdomen and pelvis 05/21/2006. FINDINGS: Cardiovascular: No filling defect within the pulmonary arterial tree to suggest pulmonary embolism. Heart size is normal. There is no significant pericardial fluid, thickening or pericardial calcification. There is aortic atherosclerosis, as well as atherosclerosis of the great vessels of the mediastinum and the coronary arteries, including calcified atherosclerotic plaque in the left main, left anterior descending and right coronary arteries. Mediastinum/Nodes: No pathologically enlarged mediastinal or hilar lymph nodes. Esophagus is unremarkable in appearance. No axillary lymphadenopathy. Lungs/Pleura: No acute consolidative airspace disease. No pleural effusions. No suspicious appearing pulmonary nodules or masses are noted. Upper Abdomen: Aortic atherosclerosis. Musculoskeletal: There are no aggressive appearing lytic or blastic lesions noted in the visualized portions of the skeleton. Review of the MIP images confirms the above findings. IMPRESSION: 1. No acute findings in the thorax to account for the patient's symptoms. Specifically, no evidence of pulmonary embolism. 2. Aortic atherosclerosis, in addition to left main and 2 vessel coronary artery disease. Aortic Atherosclerosis (ICD10-I70.0). Electronically Signed   By: Trudie Reed M.D.   On: 08/16/2023 06:41   DG Pelvis 1-2 Views  Result Date: 08/16/2023 CLINICAL DATA:  Fall, dizziness EXAM: PELVIS - 1-2 VIEW COMPARISON:  None Available. FINDINGS: There is no evidence of pelvic fracture or diastasis. No pelvic bone lesions are seen. IMPRESSION: Negative. Electronically Signed   By: Charlett Nose M.D.   On:  08/16/2023 01:04   DG Chest 1 View  Result Date: 08/16/2023 CLINICAL DATA:  Fall, dizziness EXAM: CHEST  1 VIEW COMPARISON:  10/06/2017 FINDINGS: Heart and mediastinal contours are within normal limits. No focal opacities or effusions. No acute bony abnormality. Aortic atherosclerosis. IMPRESSION: No active cardiopulmonary disease. Electronically Signed   By: Charlett Nose M.D.   On: 08/16/2023 01:03   DG Forearm Right  Result Date: 08/16/2023 CLINICAL DATA:  Fall, laceration to right forearm EXAM: RIGHT FOREARM - 2 VIEW COMPARISON:  None Available. FINDINGS: Soft tissue swelling along the posterior aspect of the forearm. No radiopaque foreign bodies. No acute bony abnormality. Specifically, no fracture, subluxation, or dislocation. IMPRESSION: Posterior soft tissue laceration and swelling. No acute bony abnormality. Electronically Signed   By: Charlett Nose M.D.  On: 08/16/2023 01:02   CT Head Wo Contrast  Result Date: 08/16/2023 CLINICAL DATA:  Fall.  Head trauma, minor (Age >= 65y) EXAM: CT HEAD WITHOUT CONTRAST TECHNIQUE: Contiguous axial images were obtained from the base of the skull through the vertex without intravenous contrast. RADIATION DOSE REDUCTION: This exam was performed according to the departmental dose-optimization program which includes automated exposure control, adjustment of the mA and/or kV according to patient size and/or use of iterative reconstruction technique. COMPARISON:  None Available. FINDINGS: Brain: No acute intracranial abnormality. Specifically, no hemorrhage, hydrocephalus, mass lesion, acute infarction, or significant intracranial injury. Vascular: No hyperdense vessel or unexpected calcification. Skull: No acute calvarial abnormality. Sinuses/Orbits: No acute findings Other: None IMPRESSION: No acute intracranial abnormality. Electronically Signed   By: Charlett Nose M.D.   On: 08/16/2023 00:55    Microbiology: No results found for this or any previous  visit.  Labs: CBC: Recent Labs  Lab 08/16/23 0000 08/16/23 0424 08/17/23 0502  WBC 7.2 8.6 7.8  NEUTROABS 3.9  --   --   HGB 12.8 11.8* 9.9*  HCT 38.4 35.2* 28.4*  MCV 95.3 96.4 94.4  PLT 230 222 185   Basic Metabolic Panel: Recent Labs  Lab 08/16/23 0000 08/16/23 0424 08/17/23 0502  NA 135 137 141  K 3.6 3.9 3.7  CL 103 107 109  CO2 26 24 21*  GLUCOSE 129* 108* 89  BUN 22 20 14   CREATININE 1.40* 1.20* 0.99  CALCIUM 8.8* 8.3* 8.2*   Liver Function Tests: Recent Labs  Lab 08/16/23 0000  AST 21  ALT 13  ALKPHOS 41  BILITOT 1.0  PROT 6.8  ALBUMIN 3.8   CBG: No results for input(s): "GLUCAP" in the last 168 hours.  Discharge time spent: {LESS THAN/GREATER ZHYQ:65784} 30 minutes.  Signed: Pennie Banter, DO [facility] 08/17/2023

## 2023-08-18 DIAGNOSIS — J449 Chronic obstructive pulmonary disease, unspecified: Secondary | ICD-10-CM | POA: Diagnosis present

## 2023-08-18 DIAGNOSIS — I214 Non-ST elevation (NSTEMI) myocardial infarction: Secondary | ICD-10-CM | POA: Diagnosis present

## 2023-08-18 DIAGNOSIS — M1990 Unspecified osteoarthritis, unspecified site: Secondary | ICD-10-CM | POA: Diagnosis present

## 2023-08-18 DIAGNOSIS — S51811A Laceration without foreign body of right forearm, initial encounter: Secondary | ICD-10-CM | POA: Diagnosis present

## 2023-08-18 DIAGNOSIS — Z79899 Other long term (current) drug therapy: Secondary | ICD-10-CM | POA: Diagnosis not present

## 2023-08-18 DIAGNOSIS — Z7982 Long term (current) use of aspirin: Secondary | ICD-10-CM | POA: Diagnosis not present

## 2023-08-18 DIAGNOSIS — Z7189 Other specified counseling: Secondary | ICD-10-CM | POA: Diagnosis not present

## 2023-08-18 DIAGNOSIS — N179 Acute kidney failure, unspecified: Secondary | ICD-10-CM | POA: Diagnosis present

## 2023-08-18 DIAGNOSIS — R42 Dizziness and giddiness: Secondary | ICD-10-CM | POA: Diagnosis not present

## 2023-08-18 DIAGNOSIS — I447 Left bundle-branch block, unspecified: Secondary | ICD-10-CM | POA: Diagnosis present

## 2023-08-18 DIAGNOSIS — E876 Hypokalemia: Secondary | ICD-10-CM | POA: Diagnosis not present

## 2023-08-18 DIAGNOSIS — Z6841 Body Mass Index (BMI) 40.0 and over, adult: Secondary | ICD-10-CM | POA: Diagnosis not present

## 2023-08-18 DIAGNOSIS — D62 Acute posthemorrhagic anemia: Secondary | ICD-10-CM | POA: Diagnosis not present

## 2023-08-18 DIAGNOSIS — Z7902 Long term (current) use of antithrombotics/antiplatelets: Secondary | ICD-10-CM | POA: Diagnosis not present

## 2023-08-18 DIAGNOSIS — K589 Irritable bowel syndrome without diarrhea: Secondary | ICD-10-CM | POA: Diagnosis present

## 2023-08-18 DIAGNOSIS — K219 Gastro-esophageal reflux disease without esophagitis: Secondary | ICD-10-CM | POA: Diagnosis present

## 2023-08-18 DIAGNOSIS — R296 Repeated falls: Secondary | ICD-10-CM

## 2023-08-18 DIAGNOSIS — E860 Dehydration: Secondary | ICD-10-CM | POA: Diagnosis present

## 2023-08-18 DIAGNOSIS — N1831 Chronic kidney disease, stage 3a: Secondary | ICD-10-CM | POA: Diagnosis present

## 2023-08-18 DIAGNOSIS — I129 Hypertensive chronic kidney disease with stage 1 through stage 4 chronic kidney disease, or unspecified chronic kidney disease: Secondary | ICD-10-CM | POA: Diagnosis present

## 2023-08-18 DIAGNOSIS — R55 Syncope and collapse: Secondary | ICD-10-CM | POA: Diagnosis present

## 2023-08-18 DIAGNOSIS — I951 Orthostatic hypotension: Secondary | ICD-10-CM | POA: Diagnosis present

## 2023-08-18 DIAGNOSIS — E785 Hyperlipidemia, unspecified: Secondary | ICD-10-CM | POA: Diagnosis present

## 2023-08-18 DIAGNOSIS — Z8572 Personal history of non-Hodgkin lymphomas: Secondary | ICD-10-CM | POA: Diagnosis not present

## 2023-08-18 DIAGNOSIS — S41111A Laceration without foreign body of right upper arm, initial encounter: Secondary | ICD-10-CM | POA: Diagnosis present

## 2023-08-18 DIAGNOSIS — G629 Polyneuropathy, unspecified: Secondary | ICD-10-CM | POA: Diagnosis present

## 2023-08-18 DIAGNOSIS — Z66 Do not resuscitate: Secondary | ICD-10-CM | POA: Diagnosis present

## 2023-08-18 DIAGNOSIS — W19XXXA Unspecified fall, initial encounter: Secondary | ICD-10-CM | POA: Diagnosis present

## 2023-08-18 LAB — URINALYSIS, ROUTINE W REFLEX MICROSCOPIC
Bilirubin Urine: NEGATIVE
Glucose, UA: NEGATIVE mg/dL
Hgb urine dipstick: NEGATIVE
Ketones, ur: 20 mg/dL — AB
Leukocytes,Ua: NEGATIVE
Nitrite: NEGATIVE
Protein, ur: NEGATIVE mg/dL
Specific Gravity, Urine: 1.018 (ref 1.005–1.030)
pH: 5 (ref 5.0–8.0)

## 2023-08-18 LAB — CBC
HCT: 25.6 % — ABNORMAL LOW (ref 36.0–46.0)
Hemoglobin: 8.7 g/dL — ABNORMAL LOW (ref 12.0–15.0)
MCH: 32 pg (ref 26.0–34.0)
MCHC: 34 g/dL (ref 30.0–36.0)
MCV: 94.1 fL (ref 80.0–100.0)
Platelets: 184 10*3/uL (ref 150–400)
RBC: 2.72 MIL/uL — ABNORMAL LOW (ref 3.87–5.11)
RDW: 12.4 % (ref 11.5–15.5)
WBC: 9.2 10*3/uL (ref 4.0–10.5)
nRBC: 0 % (ref 0.0–0.2)

## 2023-08-18 LAB — LIPOPROTEIN A (LPA): Lipoprotein (a): 13 nmol/L (ref <75.0)

## 2023-08-18 NOTE — Progress Notes (Addendum)
Progress Note   Patient: Sandra Delgado MVH:[id number] DOB: 09-28-1933 DOA: 08/15/2023     0 DOS: the patient was seen and examined on 08/18/2023   Brief hospital course:  From HPI on admission: "Sandra Delgado is a 89 y.o. Caucasian female with medical history significant for COPD, hypertension, dyslipidemia, IBS, lymphoma, migraine and osteoarthritis, who presented to the emergency room with acute onset of dizziness when she went to the bathroom with possibly presyncope.  She thought that she fell down without having head injury.  She had similar episode last week when she actually hit her head.  This time she had significant right forearm tear.  "  ED course notable for elevated BP 182/70, labile HR 58 >> 119 >> 60, RR 22.   She was reportedly orthostatic in the ED, given IV fluids. Head CT non-acute.  CXR negative.  UA without infection.  Pelvic xray negative.  Pt had a right forearm skin tear and laceration that was sutured and dressed in the ED.  She was admitted due to elevated troponin with +delta on trending.  Cardiology was consulted and pt started on IV heparin.  Further hospital course and management as outlined below.    Assessment and Plan: * NSTEMI (non-ST elevated myocardial infarction) (HCC) Elevated troponin --Cardiology consulted --Treated with IV heparin --Continue home cardiac meds --Echo shows preserved EF, grade II diastolic dysfunction --Nuc stress test was normal --Follow up with Dr. Melton Alar, Cardiology in 1-2 weeks --14 cardiac monitor placed for further evaluation into apparent recurrent syncopal episodes  AKI (acute kidney injury) (HCC) Resolved with IV hydration. Most likely prerenal due to volume depletion and dehydration. --Follow BMP's at follow up --Encourage hydration  Dizziness Suspected syncope due to possible orthostatic hypotension. D-dimer was mildly elevated, thus CTA chest obtained with ruled out PE. --Monitor orthostatic vitals --PT/OT  evaluations --Telemetry --Fall precautions  ABLA (acute blood loss anemia) From right arm laceration bleeding while on heparin.  Hbg on admission 11.8, near normal, previous was normal. Hbg trended: 9.9 >> 8.7 --Now off heparin, no longer actively bleeding --Trend Hbg closely  Recurrent falls ?Syncopal episodes Granddaughter reported mentation not at baseline since a recent fall when she hit her head.  CT head negative in ED. Suspect Concussion. --Fall precautions --PT/OT rec is for SNF  Arm laceration, right, initial encounter Sustained during fall, apparent syncopal episode.  Sutured in the ED. --Wound care requested for d/c instructions --PT/OT evaluations - SNF recommended --Pain control as needed --Start PO antibiotics as skin tear sustained from bathroom door hinge, risk of infection.  Started Keflex and Doxy Right hand edema --Elevate right upper extremity --Avoid over-compression with dressings  Right arm bleeding (8/26) - resolved --Reported by RN evening 8/26, had reportedly been ongoing earlier in day while on heparin --Now off heparin - monitor closely --Trend Hbg   Essential hypertension --Continue home regimen --If positive orthostatics, may need regimen adjusted  Peripheral neuropathy --Continue Neurontin  GERD without esophagitis --Continue PPI  Dyslipidemia --Continue statin        Subjective: Pt seen awake sitting up in bed this AM, no family present at the time. She reports feeling okay, some pain in her arm.  Nurse reports night shift signed out that patient was extremely confused, agitated and appeared to be hallucinating overnight. She seems mildly confused this AM, but oriented and appropriate during our conversation.  She denies acute complaints.  Does not feel she is safe at home alone right now with her arm  and her recent falls. Expresses fear of more severe injury.  Agreeable to go to rehab.   Physical Exam: Vitals:   08/18/23 0500  08/18/23 0857 08/18/23 1157 08/18/23 1532  BP: (!) 134/56 (!) 123/49 (!) 112/53 (!) 118/48  Pulse: 68 74 68 72  Resp:  18 18 19   Temp: 98.1 F (36.7 C) 98.3 F (36.8 C) 97.9 F (36.6 C) 98.8 F (37.1 C)  TempSrc: Oral Oral Oral Oral  SpO2: 98% 98% 100% 99%  Weight:      Height:       General exam: awake, alert, no acute distress, mildly confused HEENT: moist mucus membranes, hearing grossly normal  Respiratory system: CTAB no wheezes, rales or rhonchi, normal respiratory effort. Cardiovascular system: normal S1/S2, RRR, no JVD, murmurs, rubs, gallops,  no pedal edema.   Gastrointestinal system: soft, NT, ND, no HSM felt, +bowel sounds. Central nervous system: A&O x 3 but mildly confused. no gross focal neurologic deficits, normal speech Extremities: right forearm skin tear dressing removed (image below). Not actively bleeding but clotted blood present, sutures barely visible. Right hand was tensely edematous and cold to touch but quickly improved once dressing removed, it was applied very tightly Skin: dry, intact, normal temperature, right hand/arm color in pic below Psychiatry: normal mood, congruent affect, judgement and insight appear normal        Data Reviewed:  Notable labs -- Last BMP with bicarb 21, Ca 8.2 otherwise normal. CBC with Hbg 11.8 >> 9.9 >> 8.7 (arm lac bleeding yesterday while on heparin)    Family Communication: son at bedside on rounds yesterday. None present today, will attempt to call.  Disposition: Status is: Inpatient Remains inpatient appropriate because: requires SNF/rehab placement. Unsafe to dc home as pt lives alone, is very high falls risk and with cognitive deficits.     Planned Discharge Destination: SNF    Time spent: 45 minutes   Author: Pennie Banter, DO 08/18/2023 4:51 PM  For on call review [email or web address].

## 2023-08-18 NOTE — Assessment & Plan Note (Signed)
?  Syncopal episodes Granddaughter reported mentation not at baseline since a recent fall when she hit her head.  CT head negative in ED. Suspect Concussion. --Fall precautions --PT/OT rec is for SNF

## 2023-08-18 NOTE — Assessment & Plan Note (Addendum)
Sustained during fall, apparent syncopal episode.  Sutured in the ED. --Wound care requested for d/c instructions --PT/OT evaluations - SNF recommended --Pain control as needed --Start PO antibiotics as skin tear sustained from bathroom door hinge, risk of infection.  Started Keflex and Doxy Right hand edema --Elevate right upper extremity --Avoid over-compression with dressings  Right arm bleeding (8/26) - resolved --Reported by RN evening 8/26, had reportedly been ongoing earlier in day while on heparin --Now off heparin - monitor closely --Trend Hbg

## 2023-08-18 NOTE — Assessment & Plan Note (Signed)
From right arm laceration bleeding while on heparin.  Hbg on admission 11.8, near normal, previous was normal. Hbg trended: 9.9 >> 8.7 --Now off heparin, no longer actively bleeding --Trend Hbg closely

## 2023-08-18 NOTE — Evaluation (Signed)
Physical Therapy Evaluation Patient Details Name: Sandra Delgado MRN: [id number] DOB: Dec 06, 1933 Today's Date: 08/18/2023  History of Present Illness  Pt is an 89 y.o. female with medical history significant for COPD, hypertension, dyslipidemia, IBS, lymphoma, migraine and osteoarthritis, who presented to the emergency room with acute onset of dizziness and fall.  MD assessment includes: NSTEMI, elevated troponin, AKI, dizziness with suspected syncope, and RUE laceration/edema.   Clinical Impression  Pt was pleasant and motivated to participate during the session and put forth good effort throughout. Pt somewhat confused throughout the session requiring at times extensive multi-modal cuing and time to follow 1-step commands.  Pt required physical assistance with all functional tasks including to prevent LOB most notably upon coming to initial standing position.  Pt is at a very high risk for falls at this time and will benefit from continued PT services upon discharge to safely address deficits listed in patient problem list for decreased caregiver assistance and eventual return to PLOF.          If plan is discharge home, recommend the following: A little help with walking and/or transfers;A little help with bathing/dressing/bathroom;Assistance with cooking/housework;Direct supervision/assist for medications management;Help with stairs or ramp for entrance;Assist for transportation;Supervision due to cognitive status   Can travel by private vehicle   Yes    Equipment Recommendations Other (comment) (TBD at next venue of care)  Recommendations for Other Services       Functional Status Assessment Patient has had a recent decline in their functional status and demonstrates the ability to make significant improvements in function in a reasonable and predictable amount of time.     Precautions / Restrictions Precautions Precautions: Fall Restrictions Weight Bearing Restrictions: No       Mobility  Bed Mobility Overal bed mobility: Needs Assistance Bed Mobility: Supine to Sit     Supine to sit: Min assist     General bed mobility comments: Min A for BLE and trunk control    Transfers Overall transfer level: Needs assistance Equipment used: Rolling walker (2 wheels) Transfers: Sit to/from Stand Sit to Stand: Min assist           General transfer comment: Min A both to come to full upright standing and to prevent posterior LOB    Ambulation/Gait Ambulation/Gait assistance: Min assist Gait Distance (Feet): 4 Feet Assistive device: Rolling walker (2 wheels) Gait Pattern/deviations: Step-through pattern, Decreased step length - right, Decreased step length - left, Trunk flexed, Shuffle, Wide base of support Gait velocity: decreased     General Gait Details: Effortful steps with very slow cadence and wide BOS, min A for stability and heavy cuing for sequencing  Stairs            Wheelchair Mobility     Tilt Bed    Modified Rankin (Stroke Patients Only)       Balance Overall balance assessment: Needs assistance, History of Falls Sitting-balance support: Bilateral upper extremity supported, Feet supported Sitting balance-Leahy Scale: Fair     Standing balance support: Bilateral upper extremity supported, During functional activity Standing balance-Leahy Scale: Poor Standing balance comment: Min A in standing to prevent LOB                             Pertinent Vitals/Pain Pain Assessment Pain Assessment: No/denies pain    Home Living Family/patient expects to be discharged to:: Private residence Living Arrangements: Alone Available Help at Discharge: Family;Available PRN/intermittently  Type of Home: House Home Access: Stairs to enter Entrance Stairs-Rails: Right;Left (too wide for both) Entrance Stairs-Number of Steps: 3   Home Layout: One level Home Equipment: Grab bars - tub/shower Additional Comments: Pt somewhat  confused with son in room and assisted with history    Prior Function Prior Level of Function : Independent/Modified Independent             Mobility Comments: Ind amb community distances without an AD, drives, exercises at the senior center 3x/wk, two falls in the last 6 months ADLs Comments: Ind with ADLs     Extremity/Trunk Assessment   Upper Extremity Assessment Upper Extremity Assessment: Generalized weakness    Lower Extremity Assessment Lower Extremity Assessment: Generalized weakness       Communication   Communication Communication: Hearing impairment Cueing Techniques: Verbal cues;Gestural cues;Tactile cues;Visual cues  Cognition Arousal: Alert Behavior During Therapy: WFL for tasks assessed/performed Overall Cognitive Status: Impaired/Different from baseline Area of Impairment: Orientation, Following commands, Safety/judgement, Awareness                       Following Commands: Follows one step commands inconsistently       General Comments: Pt required extra time and cuing to follow most commands        General Comments      Exercises     Assessment/Plan    PT Assessment Patient needs continued PT services  PT Problem List Decreased strength;Decreased activity tolerance;Decreased balance;Decreased mobility;Decreased cognition;Decreased knowledge of use of DME;Decreased safety awareness       PT Treatment Interventions DME instruction;Gait training;Stair training;Functional mobility training;Therapeutic exercise;Therapeutic activities;Balance training;Patient/family education    PT Goals (Current goals can be found in the Care Plan section)  Acute Rehab PT Goals Patient Stated Goal: To walk better and to get back to exercising at the senior center PT Goal Formulation: With patient Time For Goal Achievement: 08/31/23 Potential to Achieve Goals: Fair    Frequency Min 1X/week     Co-evaluation               AM-PAC PT "6  Clicks" Mobility  Outcome Measure Help needed turning from your back to your side while in a flat bed without using bedrails?: A Little Help needed moving from lying on your back to sitting on the side of a flat bed without using bedrails?: A Little Help needed moving to and from a bed to a chair (including a wheelchair)?: A Little Help needed standing up from a chair using your arms (e.g., wheelchair or bedside chair)?: A Little Help needed to walk in hospital room?: A Lot Help needed climbing 3-5 steps with a railing? : A Lot 6 Click Score: 16    End of Session Equipment Utilized During Treatment: Gait belt Activity Tolerance: Patient tolerated treatment well Patient left: in chair;with call bell/phone within reach;with chair alarm set;with family/visitor present Nurse Communication: Mobility status;Other (comment) (Needs RUE elevated) PT Visit Diagnosis: Unsteadiness on feet (R26.81);History of falling (Z91.81);Muscle weakness (generalized) (M62.81);Difficulty in walking, not elsewhere classified (R26.2)    Time: 0981-1914 PT Time Calculation (min) (ACUTE ONLY): 34 min   Charges:   PT Evaluation $PT Eval Moderate Complexity: 1 Mod   PT General Charges $$ ACUTE PT VISIT: 1 Visit         D. Scott Sedale Jenifer PT, DPT 08/18/23, 12:00 PM

## 2023-08-18 NOTE — NC FL2 (Signed)
[place] MEDICAID FL2 LEVEL OF CARE FORM     IDENTIFICATION  Patient Name: Sandra Delgado Birthdate: [birth year] Sex: female Admission Date (Current Location): 08/15/2023  [facility] and [place] Number:  Chiropodist and Address:  [facility], [address], [place], [place] [address]      Provider Number: [id number]  Attending Physician Name and Address:  Pennie Banter, DO  Relative Name and Phone Number:  [name]    Current Level of Care: Hospital Recommended Level of Care: Skilled Nursing Facility Prior Approval Number:    Date Approved/Denied:   PASRR Number: [id number]  Discharge Plan: SNF    Current Diagnoses: Patient Active Problem List   Diagnosis Date Noted   Arm laceration, right, initial encounter 08/18/2023   AKI (acute kidney injury) (HCC) 08/16/2023   Dizziness 08/16/2023   Dyslipidemia 08/16/2023   GERD without esophagitis 08/16/2023   Peripheral neuropathy 08/16/2023   Essential hypertension 08/16/2023   NSTEMI (non-ST elevated myocardial infarction) (HCC) 10/06/2017   COPD exacerbation (HCC) 02/06/2016   HLD (hyperlipidemia) 02/06/2016   HTN (hypertension) 02/06/2016   GERD (gastroesophageal reflux disease) 02/06/2016    Orientation RESPIRATION BLADDER Height & Weight     Self  Normal Incontinent Weight: 109.2 kg Height:  5\' 1"  (154.9 cm)  BEHAVIORAL SYMPTOMS/MOOD NEUROLOGICAL BOWEL NUTRITION STATUS  Other (Comment) (n/a)  (n/a) Continent Diet (Heart Healthy)  AMBULATORY STATUS COMMUNICATION OF NEEDS Skin   Limited Assist Verbally  (Erythema)                       Personal Care Assistance Level of Assistance  Bathing, Dressing Bathing Assistance: Limited assistance   Dressing Assistance: Limited assistance     Functional Limitations Info  Hearing   Hearing Info: Impaired      SPECIAL CARE FACTORS FREQUENCY  PT (By licensed PT), OT (By licensed OT)     PT Frequency: Min 2x  weekly OT Frequency: Min 2xweekly            Contractures Contractures Info: Not present    Additional Factors Info  Code Status, Allergies Code Status Info: FULL Allergies Info: Ace Inhibitors, Amoxicillin, Atorvastatin, Ezetimibe, Niacin And Related           Current Medications (08/18/2023):  This is the current hospital active medication list Current Facility-Administered Medications  Medication Dose Route Frequency Provider Last Rate Last Admin   acetaminophen (TYLENOL) tablet 650 mg  650 mg Oral Q4H PRN Mansy, Jan A, MD   650 mg at 08/17/23 1800   ALPRAZolam (XANAX) tablet 0.25 mg  0.25 mg Oral BID PRN Mansy, Jan A, MD   0.25 mg at 08/17/23 2016   aspirin EC tablet 81 mg  81 mg Oral Daily Mansy, Jan A, MD   81 mg at 08/18/23 1015   atorvastatin (LIPITOR) tablet 80 mg  80 mg Oral q1800 Mansy, Jan A, MD   80 mg at 08/16/23 1753   carvedilol (COREG) tablet 3.125 mg  3.125 mg Oral BID WC Mansy, Jan A, MD   3.125 mg at 08/18/23 1014   cephALEXin (KEFLEX) capsule 500 mg  500 mg Oral Q6H Esaw Grandchild A, DO   500 mg at 08/18/23 1228   clopidogrel (PLAVIX) tablet 75 mg  75 mg Oral Daily Hudson, Caralyn, PA-C   75 mg at 08/18/23 1015   doxycycline (VIBRA-TABS) tablet 100 mg  100 mg Oral Q12H Esaw Grandchild A, DO   100 mg  at 08/18/23 1017   loperamide (IMODIUM) capsule 2 mg  2 mg Oral PRN Pennie Banter, DO       losartan (COZAAR) tablet 100 mg  100 mg Oral Daily Mansy, Jan A, MD   100 mg at 08/18/23 1018   magnesium hydroxide (MILK OF MAGNESIA) suspension 30 mL  30 mL Oral Daily PRN Mansy, Jan A, MD       morphine (PF) 2 MG/ML injection 2 mg  2 mg Intravenous Q2H PRN Mansy, Jan A, MD   2 mg at 08/18/23 0255   nitroGLYCERIN (NITROSTAT) SL tablet 0.4 mg  0.4 mg Sublingual Q5 Min x 3 PRN Mansy, Jan A, MD       ondansetron [facility]) injection 4 mg  4 mg Intravenous Q6H PRN Mansy, Jan A, MD       pantoprazole (PROTONIX) EC tablet 40 mg  40 mg Oral Daily Mansy, Jan A, MD   40 mg at  08/18/23 1015   traZODone (DESYREL) tablet 25 mg  25 mg Oral QHS PRN Mansy, Jan A, MD   25 mg at 08/17/23 2017     Discharge Medications: Please see discharge summary for a list of discharge medications.  Relevant Imaging Results:  Relevant Lab Results:   Additional Information SS# [id number]  Truddie Hidden, RN

## 2023-08-18 NOTE — Plan of Care (Signed)

## 2023-08-18 NOTE — Progress Notes (Addendum)
Patient right forearm wound dressing is leaking at her elbow. There is a large skin flap with 3 layers of stitches per the surgical report.  Blood is on the outside of the dressing, the patients gown, on the blankets.  Removed the Coban, kerlex, 4 x 4 gauze down to the saturated layer that was sticking to the patients skin. I did not remove this layer for coagulation purposes.  Reinforced with Abd pads from wrist to elbow, kerlex wrap and Coban. Pulses palpable.  Patient continues on the Heparin gtt at this time.

## 2023-08-18 NOTE — TOC Progression Note (Addendum)
Transition of Care [facility]) - Progression Note    Patient Details  Name: Sandra Delgado MRN: [id number] Date of Birth: Apr 12, 1933  Transition of Care [facility]) CM/SW Contact  Truddie Hidden, RN Phone Number: 08/18/2023, 2:51 PM  Clinical Narrative:    Attempt to reach patient's son, [name], to discuss therapy's recommendation for SNF and discharge plans. No answer. Left a message.   FL2 completed.      Barriers to Discharge: Barriers Resolved  Expected Discharge Plan and Services         Expected Discharge Date: 08/17/23                         HH Arranged: PT, OT HH Agency: [facility] Date [facility] Agency Contacted: 08/17/23 Time HH Agency Contacted: 1623 Representative spoke with at [facility] Agency: Coralee [facility]   Social Determinants of Health (SDOH) Interventions SDOH Screenings   Food Insecurity: No Food Insecurity (04/16/2023)   Received from [facility]  Transportation Needs: No Transportation Needs (04/16/2023)   Received from [facility]  Utilities: Not At Risk (04/16/2023)   Received from [facility]  Financial Resource Strain: Low Risk  (04/16/2023)   Received from [facility]  Tobacco Use: Low Risk  (08/15/2023)    Readmission Risk Interventions     No data to display

## 2023-08-18 NOTE — Evaluation (Signed)
Occupational Therapy Evaluation Patient Details Name: Sandra Delgado MRN: [id number] DOB: 09/25/1933 Today's Date: 08/18/2023   History of Present Illness Pt is an 89 y.o. female with medical history significant for COPD, hypertension, dyslipidemia, IBS, lymphoma, migraine and osteoarthritis, who presented to the emergency room with acute onset of dizziness and fall.  MD assessment includes: NSTEMI, elevated troponin, AKI, dizziness with suspected syncope, and RUE laceration/edema.   Clinical Impression   Patient presenting with mild confusion, pleasant and motivated. Requiring extensive multimodal cuing to safely perform transfers and ADL assessment. Alert to name, situation but states she is at home. Patient lives alone PTA, IND with ADLs, driving 3x weekly to senior activities. Sons are nearby to assist. Her youngest son present for start of eval, and expression concerns regarding pt's discharge care. Pt is a high falls risk due to deficits in balance, requires minA for transfers and ADLs. Patient currently functioning below her baseline.  See flowsheet for additional details. Patient will benefit from acute OT to increase overall independence in the areas of ADLs, functional mobility, balance in order to safely discharge.        If plan is discharge home, recommend the following: A little help with walking and/or transfers;A little help with bathing/dressing/bathroom;Direct supervision/assist for financial management;Help with stairs or ramp for entrance;Direct supervision/assist for medications management;Supervision due to cognitive status;Assistance with cooking/housework    Functional Status Assessment  Patient has had a recent decline in their functional status and demonstrates the ability to make significant improvements in function in a reasonable and predictable amount of time.  Equipment Recommendations  Other (comment) (Defer to next LOC)    Recommendations for Other Services        Precautions / Restrictions Precautions Precautions: Fall Restrictions Weight Bearing Restrictions: No      Mobility Bed Mobility Overal bed mobility: Needs Assistance Bed Mobility: Supine to Sit     Supine to sit: Contact guard     General bed mobility comments: cues for sequencing    Transfers Overall transfer level: Needs assistance Equipment used: Rolling walker (2 wheels) Transfers: Sit to/from Stand, Bed to chair/wheelchair/BSC Sit to Stand: Min assist Stand pivot transfers: Min assist         General transfer comment: Cuing for RW mmgt      Balance Overall balance assessment: Needs assistance, History of Falls Sitting-balance support: Bilateral upper extremity supported, Feet supported Sitting balance-Leahy Scale: Fair                                     ADL either performed or assessed with clinical judgement   ADL Overall ADL's : Needs assistance/impaired Eating/Feeding: Set up                   Lower Body Dressing: Sitting/lateral leans;Contact guard assist Lower Body Dressing Details (indicate cue type and reason): dons/doffs socks sitting EOB with CGA Toilet Transfer: Moderate assistance;Cueing for safety;Cueing for sequencing;Rolling walker (2 wheels) Toilet Transfer Details (indicate cue type and reason): simulated recliner > EOB         Functional mobility during ADLs: Rolling walker (2 wheels);Minimal assistance General ADL Comments: Transfers with minA, minA for LB ADLs with minA-CGA for UB ADLs     Vision Baseline Vision/History: 1 Wears glasses              Pertinent Vitals/Pain Pain Assessment Pain Assessment: No/denies pain  Extremity/Trunk Assessment Upper Extremity Assessment Upper Extremity Assessment: Generalized weakness   Lower Extremity Assessment Lower Extremity Assessment: Generalized weakness       Communication Communication Communication: Hearing impairment Cueing  Techniques: Verbal cues;Gestural cues;Tactile cues   Cognition Arousal: Alert Behavior During Therapy: WFL for tasks assessed/performed Overall Cognitive Status: Impaired/Different from baseline Area of Impairment: Orientation, Following commands, Safety/judgement, Awareness                 Orientation Level: Place, Time     Following Commands: Follows one step commands inconsistently       General Comments: Additional time for processing, requires occassional redirection     General Comments  RUE wound, bandage and dressing on, R hand swollen            Home Living Family/patient expects to be discharged to:: Private residence Living Arrangements: Alone Available Help at Discharge: Family;Available PRN/intermittently Type of Home: House Home Access: Stairs to enter [employer] of Steps: 3 Entrance Stairs-Rails: Right;Left Home Layout: One level     Bathroom Shower/Tub: Chief Strategy Officer: Handicapped height     Home Equipment: Grab bars - tub/shower   Additional Comments: Pt somewhat confused with son in room and assisted with history      Prior Functioning/Environment Prior Level of Function : Independent/Modified Independent             Mobility Comments: Ind amb community distances without an AD, drives, exercises at the senior center 3x/wk, two falls in the last 6 months ADLs Comments: Ind with ADLs        OT Problem List: Decreased strength;Decreased activity tolerance;Impaired balance (sitting and/or standing);Decreased cognition;Decreased safety awareness;Decreased knowledge of use of DME or AE      OT Treatment/Interventions: Self-care/ADL training;Therapeutic exercise;DME and/or AE instruction    OT Goals(Current goals can be found in the care plan section) Acute Rehab OT Goals OT Goal Formulation: With patient Time For Goal Achievement: 09/01/23 Potential to Achieve Goals: Good  OT Frequency: Min 1X/week        AM-PAC OT "6 Clicks" Daily Activity     Outcome Measure Help from another person eating meals?: A Little Help from another person taking care of personal grooming?: A Little Help from another person toileting, which includes using toliet, bedpan, or urinal?: A Lot Help from another person bathing (including washing, rinsing, drying)?: A Lot Help from another person to put on and taking off regular upper body clothing?: A Little Help from another person to put on and taking off regular lower body clothing?: A Little 6 Click Score: 16   End of Session Equipment Utilized During Treatment: Gait belt;Rolling walker (2 wheels) Nurse Communication: Mobility status  Activity Tolerance: Patient tolerated treatment well Patient left: in bed;with call bell/phone within reach;with bed alarm set  OT Visit Diagnosis: Repeated falls (R29.6);Muscle weakness (generalized) (M62.81)                Time: 4098-1191 OT Time Calculation (min): 25 min Charges:  OT General Charges $OT Visit: 1 Visit OT Evaluation $OT Eval Low Complexity: 1 Low OT Treatments $Self Care/Home Management : 8-22 mins  Sandra Delgado, OTR/L  08/18/23, 1:41 PM

## 2023-08-18 NOTE — Progress Notes (Signed)
OT Cancellation Note  Patient Details Name: NI ASIEDU MRN: [id number] DOB: 04/19/1933   Cancelled Treatment:    Reason Eval/Treat Not Completed: Medical issues which prohibited therapy;Other (comment) (RN in room administering would care. RN additionally reports pt has been hallucinating. OT will return as able.)  Lise Auer Anndee Connett 08/18/2023, 9:49 AM

## 2023-08-19 DIAGNOSIS — D62 Acute posthemorrhagic anemia: Secondary | ICD-10-CM

## 2023-08-19 DIAGNOSIS — N179 Acute kidney failure, unspecified: Secondary | ICD-10-CM | POA: Diagnosis not present

## 2023-08-19 DIAGNOSIS — Z7189 Other specified counseling: Secondary | ICD-10-CM | POA: Diagnosis not present

## 2023-08-19 DIAGNOSIS — R42 Dizziness and giddiness: Secondary | ICD-10-CM | POA: Diagnosis not present

## 2023-08-19 DIAGNOSIS — I214 Non-ST elevation (NSTEMI) myocardial infarction: Secondary | ICD-10-CM | POA: Diagnosis not present

## 2023-08-19 LAB — CBC
HCT: 22.9 % — ABNORMAL LOW (ref 36.0–46.0)
Hemoglobin: 8 g/dL — ABNORMAL LOW (ref 12.0–15.0)
MCH: 33.1 pg (ref 26.0–34.0)
MCHC: 34.9 g/dL (ref 30.0–36.0)
MCV: 94.6 fL (ref 80.0–100.0)
Platelets: 169 10*3/uL (ref 150–400)
RBC: 2.42 MIL/uL — ABNORMAL LOW (ref 3.87–5.11)
RDW: 12.2 % (ref 11.5–15.5)
WBC: 6.9 10*3/uL (ref 4.0–10.5)
nRBC: 0 % (ref 0.0–0.2)

## 2023-08-19 LAB — BASIC METABOLIC PANEL
Anion gap: 6 (ref 5–15)
BUN: 14 mg/dL (ref 8–23)
CO2: 23 mmol/L (ref 22–32)
Calcium: 8.2 mg/dL — ABNORMAL LOW (ref 8.9–10.3)
Chloride: 109 mmol/L (ref 98–111)
Creatinine, Ser: 0.89 mg/dL (ref 0.44–1.00)
GFR, Estimated: >60 mL/min (ref >60)
Glucose, Bld: 110 mg/dL — ABNORMAL HIGH (ref 70–99)
Potassium: 3.4 mmol/L — ABNORMAL LOW (ref 3.5–5.1)
Sodium: 138 mmol/L (ref 135–145)

## 2023-08-19 MED ORDER — ENOXAPARIN SODIUM 60 MG/0.6ML IJ SOSY
50.0000 mg | PREFILLED_SYRINGE | INTRAMUSCULAR | Status: DC
  Administered 2023-08-19 – 2023-08-20 (×2): 50 mg via SUBCUTANEOUS
  Filled 2023-08-19 (×3): qty 0.6

## 2023-08-19 MED ORDER — ORAL CARE MOUTH RINSE: 15.0000 mL | OROMUCOSAL | Status: DC | PRN

## 2023-08-19 MED ORDER — POTASSIUM CHLORIDE CRYS ER 20 MEQ PO TBCR
20.0000 meq | EXTENDED_RELEASE_TABLET | Freq: Once | ORAL | Status: AC
  Administered 2023-08-19: 20 meq via ORAL
  Filled 2023-08-19: qty 1

## 2023-08-19 NOTE — TOC Progression Note (Addendum)
Transition of Care [facility]) - Progression Note    Patient Details  Name: Sandra Delgado MRN: [id number] Date of Birth: 07-Apr-1933  Transition of Care [facility]) CM/SW Contact  Truddie Hidden, RN Phone Number: 08/19/2023, 10:40 AM  Clinical Narrative:    Attempt to reach patient's son [name] to discuss discharge planning. No answer. Left a message.   11:06am Spoke with patient's Son, [name] at bedside regarding  new SNF recommendation from physical therapy. He is agreeable to SNF. His first choice is [facility]. He does not wish for [facility] to be included in the search.   Bed search initiated.  2:36 No bed offer. MD updated.     Barriers to Discharge: Barriers Resolved  Expected Discharge Plan and Services         Expected Discharge Date: 08/17/23                         HH Arranged: PT, OT HH Agency: [facility] Date [facility] Agency Contacted: 08/17/23 Time HH Agency Contacted: 1623 Representative spoke with at [facility] Agency: Coralee North   Social Determinants of Health (SDOH) Interventions SDOH Screenings   Food Insecurity: No Food Insecurity (04/16/2023)   Received from [facility]  Transportation Needs: No Transportation Needs (04/16/2023)   Received from [facility]  Utilities: Not At Risk (04/16/2023)   Received from [facility]  Financial Resource Strain: Low Risk  (04/16/2023)   Received from [facility]  Tobacco Use: Low Risk  (08/15/2023)    Readmission Risk Interventions     No data to display

## 2023-08-19 NOTE — Plan of Care (Signed)
  Problem: Clinical Measurements: Goal: Ability to maintain clinical measurements within normal limits will improve Outcome: Progressing Goal: Will remain free from infection Outcome: Progressing Goal: Diagnostic test results will improve Outcome: Progressing Goal: Respiratory complications will improve Outcome: Progressing Goal: Cardiovascular complication will be avoided Outcome: Progressing   Problem: Nutrition: Goal: Adequate nutrition will be maintained Outcome: Progressing   Problem: Elimination: Goal: Will not experience complications related to bowel motility Outcome: Progressing Goal: Will not experience complications related to urinary retention Outcome: Progressing   Problem: Coping: Goal: Level of anxiety will decrease Outcome: Progressing   Problem: Skin Integrity: Goal: Risk for impaired skin integrity will decrease Outcome: Progressing

## 2023-08-19 NOTE — Plan of Care (Signed)
  Problem: Education: Goal: Knowledge of General Education information will improve Description: Including pain rating scale, medication(s)/side effects and non-pharmacologic comfort measures Outcome: Progressing   Problem: Nutrition: Goal: Adequate nutrition will be maintained Outcome: Progressing   Problem: Safety: Goal: Ability to remain free from injury will improve Outcome: Progressing   

## 2023-08-19 NOTE — Progress Notes (Signed)
Physical Therapy Treatment Patient Details Name: Sandra Delgado MRN: [id number] DOB: 04-Aug-1933 Today's Date: 08/19/2023   History of Present Illness Pt is an 89 y.o. female with medical history significant for COPD, hypertension, dyslipidemia, IBS, lymphoma, migraine and osteoarthritis, who presented to the emergency room with acute onset of dizziness and fall.  MD assessment includes: NSTEMI, elevated troponin, AKI, dizziness with suspected syncope, and RUE laceration/edema.    PT Comments  Patient received in bed, she is pleasant and agrees to PT. Orlene Erm is very appropriate, however when asked month or where we are she is unable to answer. Patient is mod I with bed mobility, transfers with min guard. She is able to ambulate 25 feet to door and back to recliner with RW and occasional min A to steer walker. Patient without lob. She ambulates with slow cadence. Patient will continue to benefit from skilled PT to improve balance and strength for safe mobility.       If plan is discharge home, recommend the following: A little help with walking and/or transfers;A little help with bathing/dressing/bathroom;Assistance with cooking/housework;Direct supervision/assist for medications management;Help with stairs or ramp for entrance;Assist for transportation;Supervision due to cognitive status;Direct supervision/assist for financial management   Can travel by private vehicle     Yes  Equipment Recommendations  Rolling walker (2 wheels)    Recommendations for Other Services       Precautions / Restrictions Precautions Precautions: Fall Restrictions Weight Bearing Restrictions: No     Mobility  Bed Mobility Overal bed mobility: Modified Independent Bed Mobility: Supine to Sit     Supine to sit: Modified independent (Device/Increase time)     General bed mobility comments: increased time needed to get to edge of bed, but did not need physical assist.    Transfers Overall transfer  level: Needs assistance Equipment used: Rolling walker (2 wheels) Transfers: Sit to/from Stand Sit to Stand: Contact guard assist           General transfer comment: Assistance RW mmgt    Ambulation/Gait Ambulation/Gait assistance: Min assist Gait Distance (Feet): 25 Feet Assistive device: Rolling walker (2 wheels) Gait Pattern/deviations: Step-through pattern, Decreased step length - right, Decreased step length - left, Decreased stride length Gait velocity: decreased     General Gait Details: slow cadence, min A to manage walker at times.   Stairs             Wheelchair Mobility     Tilt Bed    Modified Rankin (Stroke Patients Only)       Balance Overall balance assessment: Needs assistance, History of Falls Sitting-balance support: Feet supported Sitting balance-Leahy Scale: Good     Standing balance support: Bilateral upper extremity supported, During functional activity, Reliant on assistive device for balance Standing balance-Leahy Scale: Fair Standing balance comment: min guard                            Cognition Arousal: Alert Behavior During Therapy: WFL for tasks assessed/performed Overall Cognitive Status: Within Functional Limits for tasks assessed Area of Impairment: Memory                 Orientation Level: Disoriented to, Place, Time   Memory: Decreased short-term memory Following Commands: Follows one step commands consistently                Exercises      General Comments        Pertinent Vitals/Pain  Pain Assessment Pain Assessment: No/denies pain    Home Living                          Prior Function            PT Goals (current goals can now be found in the care plan section) Acute Rehab PT Goals Patient Stated Goal: To walk better and to get back to exercising at the senior center PT Goal Formulation: With patient Time For Goal Achievement: 08/31/23 Potential to Achieve Goals:  Good Progress towards PT goals: Progressing toward goals    Frequency    Min 1X/week      PT Plan      Co-evaluation              AM-PAC PT "6 Clicks" Mobility   Outcome Measure  Help needed turning from your back to your side while in a flat bed without using bedrails?: A Little Help needed moving from lying on your back to sitting on the side of a flat bed without using bedrails?: A Little Help needed moving to and from a bed to a chair (including a wheelchair)?: A Little Help needed standing up from a chair using your arms (e.g., wheelchair or bedside chair)?: A Little Help needed to walk in hospital room?: A Little Help needed climbing 3-5 steps with a railing? : A Lot 6 Click Score: 17    End of Session Equipment Utilized During Treatment: Gait belt Activity Tolerance: Patient tolerated treatment well Patient left: in chair;with call bell/phone within reach;with chair alarm set Nurse Communication: Mobility status PT Visit Diagnosis: Muscle weakness (generalized) (M62.81);Difficulty in walking, not elsewhere classified (R26.2)     Time: 8657-8469 PT Time Calculation (min) (ACUTE ONLY): 26 min  Charges:    $Gait Training: 23-37 mins PT General Charges $$ ACUTE PT VISIT: 1 Visit                     Jalesha Plotz, PT, GCS 08/19/23,11:24 AM

## 2023-08-19 NOTE — Progress Notes (Signed)
PHARMACY CONSULT NOTE - ELECTROLYTES  Pharmacy Consult for Electrolyte Monitoring and Replacement   Recent Labs: Height: 5\' 1"  (154.9 cm) Weight: 109.2 kg (240 lb 11.9 oz) IBW/kg (Calculated) : 47.8 Estimated Creatinine Clearance: 49.9 mL/min (by C-G formula based on SCr of 0.89 mg/dL). Potassium (mmol/L)  Date Value  08/19/2023 3.4 (L)  11/27/2014 4.0   Magnesium (mg/dL)  Date Value  40/98/1191 1.8   Calcium (mg/dL)  Date Value  47/82/9562 8.2 (L)   Calcium, Total (mg/dL)  Date Value  13/07/6577 9.1   Albumin (g/dL)  Date Value  46/96/2952 3.8  04/11/2014 3.3 (L)   Sodium (mmol/L)  Date Value  08/19/2023 138  11/27/2014 135 (L)   Corrected Ca: 8.4 mg/dL  Assessment  Sandra Delgado is a 89 y.o. female presenting with NSTEMI. PMH significant for COPD, hypertension, dyslipidemia, IBS, lymphoma, migraine and osteoarthritis . Pharmacy has been consulted to monitor and replace electrolytes.  Diet: Thin fluids Pertinent medications: Losartan  Goal of Therapy: Electrolytes WNL  Plan:  Will give 20 mEq KCl x 1 dose Will order Mag and Phos level with AM labs given no updated level this admission Monitor BMP with AM labs  Thank you for allowing pharmacy to be a part of this patient's care.  Merryl Hacker, PharmD Clinical Pharmacist 08/19/2023 7:59 AM

## 2023-08-19 NOTE — Consult Note (Signed)
WOC consulted for right forearm skin tear. Appropriate wound care orders added by MD per the nursing skin care order set.   Ok to flush with saline over bath basin once outer layers of dressings removed and follow MD orders for single layer of xeroform over wound, top with dry dressings, secure with kerlix and ACE wrap or Coban (with no tension).  Change every other day beginning 08/19/23 Bedside nursing ok to change, provide topical wound care. WOC can be re-consulted if recommendations for change in topical wound care if needed.   Re consult if needed, will not follow at this time. Thanks  Seidy Labreck [employer], RN,CWOCN, CNS, CWON-AP [phone])

## 2023-08-19 NOTE — Consult Note (Addendum)
Consultation Note Date: 08/19/2023   Patient Name: Sandra Delgado  DOB: November 09, 1933  MRN: [id number]  Age / Sex: 89 y.o., female  PCP: Lauro Regulus, MD Referring Physician: Delfino Lovett, MD  Reason for Consultation: Establishing goals of care  HPI/Patient Profile: From HPI on admission: "Sandra Delgado is a 89 y.o. Caucasian female with medical history significant for COPD, hypertension, dyslipidemia, IBS, lymphoma, migraine and osteoarthritis, who presented to the emergency room with acute onset of dizziness when she went to the bathroom with possibly presyncope.  She thought that she fell down without having head injury.  She had similar episode last week when she actually hit her head.  This time she had significant right forearm tear.  "   Clinical Assessment and Goals of Care: Notes and labs reviewed.  In to see patient.  Patient's niece is present at bedside and I offered to return at a later time however patient asked her niece to step out so that we could speak.  After her niece's departure, patient states she has not seen her in a long time and is not quite sure why she is coming to visit, but does not want anyone to speak in front of her regarding medical care.  She states she has been widowed for around 17 years and lives alone.  She states she has 3 children.  She states she has advanced directives and [name] is her healthcare power of attorney.   She states she is retired from [employer].  Patient tells me that she is fully independent at baseline and drives to do grocery shopping or run errands as needed.  She states she drives to  a walking track and walks 25 minutes several times a week.  Patient states that she is a woman of faith and that God did not create [place] to live forever on this earth.  She states she does not believe that she would ever want CPR or to be put on the ventilator.  She tells me  that quality of life is extremely important to her.   She discusses that she fell, but did not remember falling.  She states she realizes that she has been confused, and this worries her. She discusses in-depth that her sons are having disagreements regarding decision making moving forward.  She states [name] is trying to honor her wishes, and her other 2 sons feel differently.  She tells me that she would like the 2 of [place] to have a conversation with her son [name] to discuss her wishes.    SUMMARY OF RECOMMENDATIONS   Will follow-up with patient and son tomorrow.  Prognosis:  Unable to determine       Primary Diagnoses: Present on Admission:  NSTEMI (non-ST elevated myocardial infarction) (HCC)  AKI (acute kidney injury) (HCC)  Dyslipidemia  GERD without esophagitis  Essential hypertension  Arm laceration, right, initial encounter   I have reviewed the medical record, interviewed the patient and family, and examined the patient. The following aspects are pertinent.  Past Medical History:  Diagnosis Date   Arthritis    CKD (chronic kidney disease) stage 3, GFR 30-59 ml/min (HCC)    COPD (chronic obstructive pulmonary disease) (HCC)    HLD (hyperlipidemia)    HTN (hypertension)    IBS (irritable bowel syndrome)    Lymphoma (HCC)    Migraine    Social History   Socioeconomic History   Marital status: Widowed    Spouse name: Not on file   Number of children: Not on file   Years of education: Not on file   Highest education level: Not on file  Occupational History   Not on file  Tobacco Use   Smoking status: Never   Smokeless tobacco: Never  Substance and Sexual Activity   Alcohol use: Yes    Alcohol/week: 0.0 standard drinks of alcohol    Comment: occassional   Drug use: No   Sexual activity: Not on file  Other Topics Concern   Not on file  Social History Narrative   Lives at home by herself. Independent at baseline.   Social Determinants of Health    Financial Resource Strain: Low Risk  (04/16/2023)   Received from [facility]   Overall Financial Resource Strain (CARDIA)    Difficulty of Paying Living Expenses: Not very hard  Food Insecurity: No Food Insecurity (04/16/2023)   Received from [facility]   Hunger Vital Sign    Worried About Running Out of Food in the Last Year: Never true    Ran Out of Food in the Last Year: Never true  Transportation Needs: No Transportation Needs (04/16/2023)   Received from [facility] - Transportation    In the past 12 months, has lack of transportation kept you from medical appointments or from getting medications?: No    Lack of Transportation (Non-Medical): No  Physical Activity: Not on file  Stress: Not on file  Social Connections: Not on file   Family History  Problem Relation Age of Onset   Colon cancer Mother    Scheduled Meds:  aspirin EC  81 mg Oral Daily   atorvastatin  80 mg Oral q1800   carvedilol  3.125 mg Oral BID WC   cephALEXin  500 mg Oral Q6H   clopidogrel  75 mg Oral Daily   doxycycline  100 mg Oral Q12H   enoxaparin (LOVENOX) injection  50 mg Subcutaneous Q24H   losartan  100 mg Oral Daily   pantoprazole  40 mg Oral Daily   Continuous Infusions: PRN Meds:.acetaminophen, ALPRAZolam, loperamide, magnesium hydroxide, morphine injection, nitroGLYCERIN, ondansetron (ZOFRAN) IV, mouth rinse, traZODone Medications Prior to Admission:  Prior to Admission medications   Medication Sig Start Date End Date Taking? Authorizing Provider  atorvastatin (LIPITOR) 40 MG tablet Take 1 tablet (40 mg total) by mouth daily at 6 PM. 10/07/17  Yes Katha Hamming, MD  carvedilol (COREG) 3.125 MG tablet Take 3.125 mg by mouth 2 (two) times daily with a meal.   Yes [provider]  clopidogrel (PLAVIX) 75 MG tablet Take 75 mg by mouth daily.   Yes [provider]  gabapentin (NEURONTIN) 300 MG capsule Take  300 mg by mouth at bedtime.   Yes [provider]  losartan (COZAAR) 100 MG tablet Take 100 mg by mouth daily.   Yes [provider]  omeprazole (PRILOSEC) 40 MG capsule Take 40 mg by mouth 2 (two) times daily.    Yes [provider]  aspirin EC 81 MG EC tablet Take 1 tablet (81 mg total) by mouth daily. Patient not taking: Reported on 08/16/2023 10/07/17   Katha Hamming, MD   Allergies  Allergen Reactions   Ace Inhibitors Other (See Comments)    Reaction: unknown   Amoxicillin Diarrhea    Has patient had a PCN reaction causing immediate rash, facial/tongue/throat swelling, SOB or lightheadedness with hypotension: No Has patient had a PCN reaction causing severe rash involving mucus membranes or skin necrosis: No Has patient had a PCN reaction that required hospitalization No Has patient had a PCN reaction occurring within the last 10 years: Yes If all of the above answers are "NO", then may proceed with Cephalosporin use.    Atorvastatin Other (See Comments)    Reaction: muscle pain   Ezetimibe Other (See Comments)    Reaction: fatigue   Niacin And Related Other (See Comments)    Reaction: flushing   Review of Systems  All other systems reviewed and are negative.   Physical Exam Pulmonary:     Effort: Pulmonary effort is normal.  Neurological:     Mental Status: She is alert.     Vital Signs: BP (!) 95/53 (BP Location: Left Arm)   Pulse 73   Temp 98.9 F (37.2 C)   Resp 18   Ht 5\' 1"  (1.549 m)   Wt 109.2 kg   SpO2 95%   BMI 45.49 kg/m  Pain Scale: 0-10   Pain Score: 0-No pain   SpO2: SpO2: 95 % O2 Device:SpO2: 95 % O2 Flow Rate: .   IO: Intake/output summary:  Intake/Output Summary (Last 24 hours) at 08/19/2023 1542 Last data filed at 08/19/2023 1414 Gross per 24 hour  Intake 240 ml  Output 550 ml  Net -310 ml    LBM: Last BM Date : 08/15/23 Baseline Weight: Weight: 109.2 kg Most recent weight: Weight: 109.2 kg         Signed by: Morton Stall, NP   Please contact Palliative Medicine Team phone at [phone] for questions and concerns.  For individual provider: See Loretha Stapler

## 2023-08-19 NOTE — Progress Notes (Signed)
Occupational Therapy Treatment Patient Details Name: Sandra Delgado MRN: [id number] DOB: 01-27-1933 Today's Date: 08/19/2023   History of present illness Pt is an 89 y.o. female with medical history significant for COPD, hypertension, dyslipidemia, IBS, lymphoma, migraine and osteoarthritis, who presented to the emergency room with acute onset of dizziness and fall.  MD assessment includes: NSTEMI, elevated troponin, AKI, dizziness with suspected syncope, and RUE laceration/edema.   OT comments  Pt seen for OT treatment on this date. Discussed with NT/NP Sandra Delgado regarding pt's unwanted visitor at bedside (pt states it is her niece that she has not seen in a long time, and does not want niece to be given information regarding medical care). Niece initially present upon OT arrival, but left immediately after when OT said it was time for therapy session. Session focused on dynamic standing balance, functional transfers, and performance of UB ADLs. Pt requires min-CGA assist for transfers using RW, occ requires redirection to task, and setup for seated ADL tasks in recliner. Pt having appropriate conversation, A&Ox4, and feels like she is doing better overall. Pt making good progress toward goals, will continue to follow POC. Discharge recommendation remains appropriate.  Left seated in recliner with alarm on.       If plan is discharge home, recommend the following:  A little help with walking and/or transfers;A little help with bathing/dressing/bathroom;Direct supervision/assist for financial management;Help with stairs or ramp for entrance;Direct supervision/assist for medications management;Supervision due to cognitive status;Assistance with cooking/housework   Equipment Recommendations  Other (comment)       Precautions / Restrictions Precautions Precautions: Fall       Mobility Bed Mobility Overal bed mobility: Needs Assistance Bed Mobility: Supine to Sit     Supine to sit: Contact  guard     General bed mobility comments: increased time with 1 trunk control CGA    Transfers Overall transfer level: Needs assistance Equipment used: Rolling walker (2 wheels) Transfers: Sit to/from Stand Sit to Stand: Contact guard assist Stand pivot transfers: Contact guard assist         General transfer comment: min cuing for redirection     Balance Overall balance assessment: Needs assistance, History of Falls Sitting-balance support: Feet supported Sitting balance-Leahy Scale: Good     Standing balance support: Bilateral upper extremity supported, During functional activity, Reliant on assistive device for balance Standing balance-Leahy Scale: Fair Standing balance comment: min guard                           ADL either performed or assessed with clinical judgement   ADL Overall ADL's : Needs assistance/impaired     Grooming: Wash/dry hands;Wash/dry face;Set up;Sitting   Upper Body Bathing: Sitting;Set up       Upper Body Dressing : Set up;Sitting                   Functional mobility during ADLs: Minimal assistance;Contact guard assist General ADL Comments: performs ADL session focusing on standing balance and UB bathing/grooming.      Cognition Arousal: Alert Behavior During Therapy: WFL for tasks assessed/performed Overall Cognitive Status: Within Functional Limits for tasks assessed Area of Impairment: Memory                 Orientation Level:  (able to tell me "im in the hospital. i remember them telling me this morning")     Following Commands: Follows one step commands consistently  Pertinent Vitals/ Pain       Pain Assessment Pain Assessment: No/denies pain   Frequency  Min 1X/week        Progress Toward Goals  OT Goals(current goals can now be found in the care plan section)  Progress towards OT goals: Progressing toward goals  Acute Rehab OT Goals OT Goal  Formulation: With patient Time For Goal Achievement: 09/01/23 Potential to Achieve Goals: Good  Plan         AM-PAC OT "6 Clicks" Daily Activity     Outcome Measure   Help from another person eating meals?: None Help from another person taking care of personal grooming?: None Help from another person toileting, which includes using toliet, bedpan, or urinal?: A Lot Help from another person bathing (including washing, rinsing, drying)?: A Lot Help from another person to put on and taking off regular upper body clothing?: A Little Help from another person to put on and taking off regular lower body clothing?: A Little 6 Click Score: 18    End of Session Equipment Utilized During Treatment: Gait belt;Rolling walker (2 wheels)  OT Visit Diagnosis: Repeated falls (R29.6);Muscle weakness (generalized) (M62.81)   Activity Tolerance Patient tolerated treatment well   Patient Left in chair;with call bell/phone within reach;with chair alarm set           Time: 2952-8413 OT Time Calculation (min): 25 min  Charges: OT General Charges $OT Visit: 1 Visit OT Treatments $Self Care/Home Management : 23-37 mins  Sandra Delgado, OTR/L  08/19/23, 3:50 PM

## 2023-08-19 NOTE — Progress Notes (Signed)
PROGRESS NOTE    Sandra Delgado  IRC:[id number] DOB: 01-10-1933 DOA: 08/15/2023 PCP: Lauro Regulus, MD    Brief Narrative:   From HPI on admission: "Sandra Delgado is a 89 y.o. Caucasian female with medical history significant for COPD, hypertension, dyslipidemia, IBS, lymphoma, migraine and osteoarthritis, who presented to the emergency room with acute onset of dizziness when she went to the bathroom with possibly presyncope.  She thought that she fell down without having head injury.  She had similar episode last week when she actually hit her head.  This time she had significant right forearm tear.  "   ED course notable for elevated BP 182/70, labile HR 58 >> 119 >> 60, RR 22.   She was reportedly orthostatic in the ED, given IV fluids. Head CT non-acute.  CXR negative.  UA without infection.  Pelvic xray negative.   Pt had a right forearm skin tear and laceration that was sutured and dressed in the ED.   She was admitted due to elevated troponin with +delta on trending.  Cardiology was consulted and pt started on IV heparin.  Further hospital course and management as outlined below.    8/28: Palliative care consult for GOC conversation, waiting for SNF placement.  Transferred to MedSurg  Assessment & Plan:   Principal Problem:   NSTEMI (non-ST elevated myocardial infarction) (HCC) Active Problems:   AKI (acute kidney injury) (HCC)   Dizziness   Dyslipidemia   GERD without esophagitis   Peripheral neuropathy   Essential hypertension   Arm laceration, right, initial encounter   Recurrent falls   ABLA (acute blood loss anemia)   * NSTEMI (non-ST elevated myocardial infarction) (HCC) Elevated troponin --Cardiology signed off.  Conservative management --Treated with IV heparin --Continue home cardiac meds --Echo shows preserved EF, grade II diastolic dysfunction --Nuc stress test was normal --Follow up with Dr. Melton Alar, Cardiology in 1-2 weeks --14 cardiac monitor  placed for further evaluation into apparent recurrent syncopal episodes   AKI (acute kidney injury) (HCC) Resolved with IV hydration. Most likely prerenal due to volume depletion and dehydration. --Follow BMP's at follow up --Encourage hydration   Dizziness Suspected syncope due to possible orthostatic hypotension. D-dimer was mildly elevated, thus CTA chest obtained with ruled out PE. -- Now resolved --PT/OT recommend SNF.  Waiting for insurance Auth and placement --Fall precautions   ABLA (acute blood loss anemia) From right arm laceration bleeding while on heparin.  Hbg on admission 11.8, near normal, previous was normal. Hbg trended: 9.9 >> 8.7 >> 8.0 --Now off heparin, no longer actively bleeding --Trend Hbg closely   Recurrent falls ?Syncopal episodes Granddaughter reported mentation not at baseline since a recent fall when she hit her head.  CT head negative in ED. Suspect Concussion. --Fall precautions --PT/OT rec is for SNF   Arm laceration, right, initial encounter Sustained during fall, apparent syncopal episode.  Sutured in the ED. --Wound care requested for d/c instructions --PT/OT evaluations - SNF recommended.  Waiting for insurance Auth and placement --Pain control as needed -- On PO antibiotics as skin tear sustained from bathroom door hinge, risk of infection.  Continue Keflex and Doxy Right hand edema --Elevate right upper extremity --Avoid over-compression with dressings   Right arm bleeding (8/26) - resolved --Reported by RN evening 8/26, had reportedly been ongoing earlier in day while on heparin --Now off heparin - monitor closely.  No further bleeding --Trend Hbg     Essential hypertension --Continue home regimen  Peripheral neuropathy --Continue Neurontin   GERD without esophagitis --Continue PPI   Dyslipidemia --Continue statin     DVT prophylaxis: (Lovenox      Code Status: (Full code Family Communication: (NO "discussed with  patient") Disposition Plan: Medically stable, waiting for SNF placement, insurance Auth pending   Consultants:  Cardiology Palliative care    Antimicrobials:  Doxycycline Keflex   Subjective: No new complaint, hoping that her sons come to consensus for her discharge planning  Objective: Vitals:   08/19/23 0806 08/19/23 1154 08/19/23 1534 08/19/23 1800  BP: (!) 137/55 (!) 110/49 (!) 95/53 (!) 117/43  Pulse: 71 70 73 73  Resp: 20 17 18 18   Temp: 98.5 F (36.9 C) 98.3 F (36.8 C) 98.9 F (37.2 C) 98.2 F (36.8 C)  TempSrc:      SpO2: 99% 99% 95% 100%  Weight:      Height:        Intake/Output Summary (Last 24 hours) at 08/19/2023 2018 Last data filed at 08/19/2023 1852 Gross per 24 hour  Intake 460 ml  Output 250 ml  Net 210 ml   Filed Weights   08/15/23 2350  Weight: 109.2 kg    Examination:  General exam: Appears calm and comfortable  Respiratory system: Clear to auscultation. Respiratory effort normal. Cardiovascular system: S1 & S2 heard, RRR. No JVD, murmurs, rubs, gallops or clicks. No pedal edema. Gastrointestinal system: Abdomen is nondistended, soft and nontender. No organomegaly or masses felt. Normal bowel sounds heard. Central nervous system: Alert and oriented. No focal neurological deficits. Extremities: Symmetric 5 x 5 power. Skin: No rashes, lesions or ulcers Psychiatry: Judgement and insight appear normal. Mood & affect appropriate.     Data Reviewed: I have personally reviewed following labs and imaging studies  CBC: Recent Labs  Lab 08/16/23 0000 08/16/23 0424 08/17/23 0502 08/18/23 0938 08/19/23 0325  WBC 7.2 8.6 7.8 9.2 6.9  NEUTROABS 3.9  --   --   --   --   HGB 12.8 11.8* 9.9* 8.7* 8.0*  HCT 38.4 35.2* 28.4* 25.6* 22.9*  MCV 95.3 96.4 94.4 94.1 94.6  PLT 230 222 185 184 169   Basic Metabolic Panel: Recent Labs  Lab 08/16/23 0000 08/16/23 0424 08/17/23 0502 08/19/23 0325  NA 135 137 141 138  K 3.6 3.9 3.7 3.4*  CL  103 107 109 109  CO2 26 24 21* 23  GLUCOSE 129* 108* 89 110*  BUN 22 20 14 14   CREATININE 1.40* 1.20* 0.99 0.89  CALCIUM 8.8* 8.3* 8.2* 8.2*   GFR: Estimated Creatinine Clearance: 49.9 mL/min (by C-G formula based on SCr of 0.89 mg/dL). Liver Function Tests: Recent Labs  Lab 08/16/23 0000  AST 21  ALT 13  ALKPHOS 41  BILITOT 1.0  PROT 6.8  ALBUMIN 3.8   No results for input(s): "LIPASE", "AMYLASE" in the last 168 hours. No results for input(s): "AMMONIA" in the last 168 hours. Coagulation Profile: Recent Labs  Lab 08/16/23 0424  INR 1.1     Scheduled Meds:  aspirin EC  81 mg Oral Daily   atorvastatin  80 mg Oral q1800   carvedilol  3.125 mg Oral BID WC   cephALEXin  500 mg Oral Q6H   clopidogrel  75 mg Oral Daily   doxycycline  100 mg Oral Q12H   enoxaparin (LOVENOX) injection  50 mg Subcutaneous Q24H   losartan  100 mg Oral Daily   pantoprazole  40 mg Oral Daily   Continuous Infusions:  LOS: 1 day    Time spent: 35 minutes    Delfino Lovett, MD [facility] Pager [phone]  If 7PM-7AM, please contact night-coverage [email or web address]  08/19/2023, 8:18 PM

## 2023-08-20 DIAGNOSIS — R296 Repeated falls: Secondary | ICD-10-CM

## 2023-08-20 DIAGNOSIS — K219 Gastro-esophageal reflux disease without esophagitis: Secondary | ICD-10-CM

## 2023-08-20 DIAGNOSIS — S41111A Laceration without foreign body of right upper arm, initial encounter: Secondary | ICD-10-CM

## 2023-08-20 DIAGNOSIS — R42 Dizziness and giddiness: Secondary | ICD-10-CM | POA: Diagnosis not present

## 2023-08-20 DIAGNOSIS — Z7189 Other specified counseling: Secondary | ICD-10-CM | POA: Diagnosis not present

## 2023-08-20 DIAGNOSIS — I214 Non-ST elevation (NSTEMI) myocardial infarction: Secondary | ICD-10-CM | POA: Diagnosis not present

## 2023-08-20 DIAGNOSIS — E785 Hyperlipidemia, unspecified: Secondary | ICD-10-CM

## 2023-08-20 DIAGNOSIS — D62 Acute posthemorrhagic anemia: Secondary | ICD-10-CM | POA: Diagnosis not present

## 2023-08-20 DIAGNOSIS — N179 Acute kidney failure, unspecified: Secondary | ICD-10-CM | POA: Diagnosis not present

## 2023-08-20 LAB — BASIC METABOLIC PANEL
Anion gap: 4 — ABNORMAL LOW (ref 5–15)
BUN: 20 mg/dL (ref 8–23)
CO2: 23 mmol/L (ref 22–32)
Calcium: 8 mg/dL — ABNORMAL LOW (ref 8.9–10.3)
Chloride: 110 mmol/L (ref 98–111)
Creatinine, Ser: 1.08 mg/dL — ABNORMAL HIGH (ref 0.44–1.00)
GFR, Estimated: 49 mL/min — ABNORMAL LOW (ref >60)
Glucose, Bld: 115 mg/dL — ABNORMAL HIGH (ref 70–99)
Potassium: 3.4 mmol/L — ABNORMAL LOW (ref 3.5–5.1)
Sodium: 137 mmol/L (ref 135–145)

## 2023-08-20 LAB — CBC
HCT: 22.9 % — ABNORMAL LOW (ref 36.0–46.0)
Hemoglobin: 7.8 g/dL — ABNORMAL LOW (ref 12.0–15.0)
MCH: 32.2 pg (ref 26.0–34.0)
MCHC: 34.1 g/dL (ref 30.0–36.0)
MCV: 94.6 fL (ref 80.0–100.0)
Platelets: 188 10*3/uL (ref 150–400)
RBC: 2.42 MIL/uL — ABNORMAL LOW (ref 3.87–5.11)
RDW: 12.5 % (ref 11.5–15.5)
WBC: 6.9 10*3/uL (ref 4.0–10.5)
nRBC: 0 % (ref 0.0–0.2)

## 2023-08-20 LAB — PHOSPHORUS: Phosphorus: 2.5 mg/dL (ref 2.5–4.6)

## 2023-08-20 LAB — NM MYOCAR MULTI W/SPECT W/WALL MOTION / EF
Estimated workload: 1
Exercise duration (min): 1 min
LV dias vol: 42 mL (ref 46–106)
LV sys vol: 17 mL
MPHR: 132 {beats}/min
Nuc Stress EF: 60 %
Peak HR: 90 {beats}/min
Percent HR: 68 %
Rest HR: 68 {beats}/min
Rest Nuclear Isotope Dose: 9.9 mCi
SDS: 2
SRS: 2
SSS: 2
ST Depression (mm): 0 mm
Stress Nuclear Isotope Dose: 29.8 mCi
TID: 1.18

## 2023-08-20 LAB — MAGNESIUM: Magnesium: 1.9 mg/dL (ref 1.7–2.4)

## 2023-08-20 MED ORDER — POTASSIUM CHLORIDE CRYS ER 20 MEQ PO TBCR
40.0000 meq | EXTENDED_RELEASE_TABLET | Freq: Once | ORAL | Status: AC
  Administered 2023-08-20: 40 meq via ORAL
  Filled 2023-08-20: qty 2

## 2023-08-20 NOTE — Progress Notes (Signed)
PHARMACY CONSULT NOTE - ELECTROLYTES  Pharmacy Consult for Electrolyte Monitoring and Replacement   Recent Labs: Height: 5\' 1"  (154.9 cm) Weight: 109.2 kg (240 lb 11.9 oz) IBW/kg (Calculated) : 47.8 Estimated Creatinine Clearance: 41.2 mL/min (A) (by C-G formula based on SCr of 1.08 mg/dL (H)). Potassium (mmol/L)  Date Value  08/20/2023 3.4 (L)  11/27/2014 4.0   Magnesium (mg/dL)  Date Value  16/09/9603 1.9  09/06/2013 1.8   Calcium (mg/dL)  Date Value  54/08/8118 8.0 (L)   Calcium, Total (mg/dL)  Date Value  14/78/2956 9.1   Albumin (g/dL)  Date Value  21/30/8657 3.8  04/11/2014 3.3 (L)   Phosphorus (mg/dL)  Date Value  84/69/6295 2.5   Sodium (mmol/L)  Date Value  08/20/2023 137  11/27/2014 135 (L)   Corrected Ca: 8.4 mg/dL  Assessment  Sandra Delgado is a 89 y.o. female presenting with NSTEMI. PMH significant for COPD, hypertension, dyslipidemia, IBS, lymphoma, migraine and osteoarthritis . Pharmacy has been consulted to monitor and replace electrolytes.  Diet: Thin fluids Pertinent medications: Losartan  Goal of Therapy: Electrolytes WNL  Plan:  Will give 40 mEq KCl x 1 dose Monitor BMP with AM labs  Thank you for allowing pharmacy to be a part of this patient's care.  Lowella Bandy, PharmD Clinical Pharmacist 08/20/2023 7:05 AM

## 2023-08-20 NOTE — Plan of Care (Signed)
  Problem: Skin Integrity: Goal: Risk for impaired skin integrity will decrease 08/20/2023 1545 by Hendryx Ricke Bet, LPN Outcome: Progressing 08/20/2023 1114 by Souleymane Saiki Bet, LPN Outcome: Progressing   Problem: Pain Managment: Goal: General experience of comfort will improve 08/20/2023 1545 by Juquan Reznick Bet, LPN Outcome: Progressing 08/20/2023 1114 by Jeannie Mallinger Bet, LPN Outcome: Progressing   Problem: Safety: Goal: Ability to remain free from injury will improve 08/20/2023 1545 by Zayveon Raschke Bet, LPN Outcome: Progressing 08/20/2023 1114 by Caelyn Route Bet, LPN Outcome: Progressing   Problem: Elimination: Goal: Will not experience complications related to bowel motility 08/20/2023 1545 by Lexie Koehl Bet, LPN Outcome: Progressing 08/20/2023 1114 by Lavan Imes Bet, LPN Outcome: Progressing   Problem: Coping: Goal: Level of anxiety will decrease 08/20/2023 1545 by Margie Urbanowicz Bet, LPN Outcome: Progressing 08/20/2023 1114 by Deyra Perdomo Bet, LPN Outcome: Progressing

## 2023-08-20 NOTE — TOC Progression Note (Signed)
Transition of Care [facility]) - Progression Note    Patient Details  Name: Sandra Delgado MRN: [id number] Date of Birth: 1933-04-15  Transition of Care [facility]) CM/SW Contact  Marlowe Sax, RN Phone Number: 08/20/2023, 12:08 PM  Clinical Narrative:     Sherron Monday with [name] the patient's son, she is doing better and they will be going home with [facility] enhabit RW to be delivered to the bedside by [employer] He is interested in hiting a [employer] company, I reviewed all of the local companies and he asked that [employer] call him, I provided Abigail with [employer] [facility] his contact information [name] will provide transportation    Barriers to Discharge: Barriers Resolved  Expected Discharge Plan and Services         Expected Discharge Date: 08/17/23                         HH Arranged: PT, OT HH Agency: [facility] Date [facility] Agency Contacted: 08/17/23 Time HH Agency Contacted: 1623 Representative spoke with at [facility] Agency: Coralee [facility]   Social Determinants of Health (SDOH) Interventions SDOH Screenings   Food Insecurity: No Food Insecurity (04/16/2023)   Received from [facility]  Transportation Needs: No Transportation Needs (04/16/2023)   Received from [facility]  Utilities: Not At Risk (04/16/2023)   Received from [facility]  Financial Resource Strain: Low Risk  (04/16/2023)   Received from [facility]  Tobacco Use: Low Risk  (08/15/2023)    Readmission Risk Interventions     No data to display

## 2023-08-20 NOTE — Progress Notes (Addendum)
PROGRESS NOTE    Lynsee Pribble Ouellet  BMW:[id number] DOB: Jan 20, 1933 DOA: 08/15/2023 PCP: Lauro Regulus, MD    Brief Narrative:   Sandra Delgado is a 89 y.o. Caucasian female with medical history significant for COPD, hypertension, dyslipidemia, IBS, lymphoma, migraine and osteoarthritis, who presented to the emergency room with acute onset of dizziness when she went to the bathroom with possibly presyncope.  She had a similar episode last week when she fell down and hit her head.  In the ED patient was noted to be orthostatic and received IV fluids.  CT head scan was negative.  Chest x-ray was negative for infiltrates.  Urinalysis negative for infection.  X-ray of the pelvis was negative for any fractures.  Patient had a right forearm skin tear and laceration that was sutured in the ED.  She was also noted to have elevated troponin and cardiology was consulted.  Subsequently on 08/19/2023 palliative care was consulted for goals of care conversation.  Currently awaiting for skilled nursing facility placement.    Assessment & Plan:   Principal Problem:   NSTEMI (non-ST elevated myocardial infarction) (HCC) Active Problems:   AKI (acute kidney injury) (HCC)   Dizziness   Dyslipidemia   GERD without esophagitis   Peripheral neuropathy   Essential hypertension   Arm laceration, right, initial encounter   Recurrent falls   ABLA (acute blood loss anemia)   * NSTEMI (non-ST elevated myocardial infarction)  Cardiology was consulted and at this time plan is conservative treatment.  Was initially on IV heparin.  Nuclear stress test was normal and 2D echocardiogram showed preserved ejection fraction with no regional wall motion abnormality.  Patient will need to follow-up with Dr.Custovic, Cardiology in 1-2 weeks. 14 cardiac monitor placed for further evaluation into apparent recurrent syncopal episodes.  Continue aspirin, Lipitor, losartan, Coreg.   AKI (acute kidney injury) Resolved with  hydration.  Latest creatinine of 1.0.  Hypokalemia.  Potassium 3.4 today.  Will replenish with oral potassium.  Dizziness Recurrent falls. Suspected syncope due to possible orthostatic hypotension.  CT head was negative. D-dimer was mildly elevated, thus CTA chest obtained with ruled out PE. Orthostatics has resolved at this time.  PT has recommended skilled nursing facility placement.  Continue fall precautions.    ABLA (acute blood loss anemia) From right arm laceration bleeding while on heparin.  Hbg on admission 11.8, hemoglobin today at 7.8.  Off heparin at this time.  No further bleed.  Transfuse for hemoglobin less than 7.   Arm laceration, right, initial encounter Sustained during fall, apparent syncopal episode.  Sutured in the ED. Wound care requested for d/c instructions  Continue Keflex and Doxy  Right hand edema --Elevate right upper extremity. -Avoid over-compression with dressings  Essential hypertension Continue Coreg, losartan.   Peripheral neuropathy Continue Neurontin.   GERD without esophagitis   Dyslipidemia --Continue statin  Cognitive dysfunction possible mild dementia.  Supportive care.  Morbid obesity.Body mass index is 45.49 kg/m.   Would benefit from weight loss as outpatient.    DVT prophylaxis: Lovenox subcu     Code Status: Full code  Family Communication: Spoke with the patient's son at bedside.  Disposition Plan: Medically stable, waiting for SNF placement, insurance Auth pending.  TOC on board.   Consultants:  Cardiology Palliative care   Antimicrobials:  Doxycycline Keflex  Subjective: Today, patient was seen and examined at bedside.  Feels okay.  Denies any interval complaints.  Patient's son at bedside.  Objective: Vitals:  08/19/23 1534 08/19/23 1800 08/19/23 2341 08/20/23 0832  BP: (!) 95/53 (!) 117/43 121/68 (!) 118/47  Pulse: 73 73 70 68  Resp: 18 18 19 18   Temp: 98.9 F (37.2 C) 98.2 F (36.8 C) 98.7 F (37.1  C) 97.9 F (36.6 C)  TempSrc:   Oral   SpO2: 95% 100% 98% 96%  Weight:      Height:        Intake/Output Summary (Last 24 hours) at 08/20/2023 1038 Last data filed at 08/19/2023 4540 Gross per 24 hour  Intake 460 ml  Output --  Net 460 ml   Filed Weights   08/15/23 2350  Weight: 109.2 kg    Physical examination: General:  Average built, not in obvious distress, elderly female, HENT:   No scleral pallor or icterus noted. Oral mucosa is moist.  Chest:  Clear breath sounds.  Diminished breath sounds bilaterally. No crackles or wheezes.  CVS: S1 &S2 heard. No murmur.  Regular rate and rhythm. Abdomen: Soft, nontender, nondistended.  Bowel sounds are heard.   Extremities: No cyanosis, clubbing or edema.  Peripheral pulses are palpable.  Right arm edema with dressing. Psych: Alert, awake and Communicative hard of hearing, underlying dementia. CNS:  No cranial nerve deficits.  Power equal in all extremities.   Skin: Warm and dry.  No rashes noted.  Data Reviewed: I have personally reviewed following labs and imaging studies  CBC: Recent Labs  Lab 08/16/23 0000 08/16/23 0424 08/17/23 0502 08/18/23 0938 08/19/23 0325 08/20/23 0416  WBC 7.2 8.6 7.8 9.2 6.9 6.9  NEUTROABS 3.9  --   --   --   --   --   HGB 12.8 11.8* 9.9* 8.7* 8.0* 7.8*  HCT 38.4 35.2* 28.4* 25.6* 22.9* 22.9*  MCV 95.3 96.4 94.4 94.1 94.6 94.6  PLT 230 222 185 184 169 188   Basic Metabolic Panel: Recent Labs  Lab 08/16/23 0000 08/16/23 0424 08/17/23 0502 08/19/23 0325 08/20/23 0416  NA 135 137 141 138 137  K 3.6 3.9 3.7 3.4* 3.4*  CL 103 107 109 109 110  CO2 26 24 21* 23 23  GLUCOSE 129* 108* 89 110* 115*  BUN 22 20 14 14 20   CREATININE 1.40* 1.20* 0.99 0.89 1.08*  CALCIUM 8.8* 8.3* 8.2* 8.2* 8.0*  MG  --   --   --   --  1.9  PHOS  --   --   --   --  2.5   GFR: Estimated Creatinine Clearance: 41.2 mL/min (A) (by C-G formula based on SCr of 1.08 mg/dL (H)). Liver Function Tests: Recent Labs   Lab 08/16/23 0000  AST 21  ALT 13  ALKPHOS 41  BILITOT 1.0  PROT 6.8  ALBUMIN 3.8   No results for input(s): "LIPASE", "AMYLASE" in the last 168 hours. No results for input(s): "AMMONIA" in the last 168 hours. Coagulation Profile: Recent Labs  Lab 08/16/23 0424  INR 1.1     Scheduled Meds:  aspirin EC  81 mg Oral Daily   atorvastatin  80 mg Oral q1800   carvedilol  3.125 mg Oral BID WC   cephALEXin  500 mg Oral Q6H   clopidogrel  75 mg Oral Daily   doxycycline  100 mg Oral Q12H   enoxaparin (LOVENOX) injection  50 mg Subcutaneous Q24H   losartan  100 mg Oral Daily   pantoprazole  40 mg Oral Daily   Continuous Infusions:   LOS: 2 days    Denene Alamillo,  MD [facility] 08/20/2023, 10:38 AM

## 2023-08-20 NOTE — Progress Notes (Signed)
Daily Progress Note   Patient Name: Sandra Delgado       Date: 08/20/2023 DOB: [birth year]  Age: 89 y.o. MRN#: [id number] Attending Physician: Joycelyn Das, MD Primary Care Physician: Lauro Regulus, MD Admit Date: 08/15/2023  Reason for Consultation/Follow-up: Establishing goals of care  Subjective: Notes and labs reviewed. In to see patient. She discusses that her sons are still discussing plans moving forward.  She discusses that there are a few rehab facilities that she wants to go to such as [facility] however she is not able to go to those.  She states currently there is conversation about going home with home health versus trying for other facilities.  She states she wants to go home but she wants to be safe.  She states her son Sandra Delgado who is HP OA has been running errands for her and is also paying her bills and taking care of other things today.  She states he will be here tomorrow, and we can discuss goals of care at that time.  Length of Stay: 2  Current Medications: Scheduled Meds:   aspirin EC  81 mg Oral Daily   atorvastatin  80 mg Oral q1800   carvedilol  3.125 mg Oral BID WC   cephALEXin  500 mg Oral Q6H   clopidogrel  75 mg Oral Daily   doxycycline  100 mg Oral Q12H   enoxaparin (LOVENOX) injection  50 mg Subcutaneous Q24H   losartan  100 mg Oral Daily   pantoprazole  40 mg Oral Daily    Continuous Infusions:   PRN Meds: acetaminophen, ALPRAZolam, loperamide, magnesium hydroxide, morphine injection, nitroGLYCERIN, ondansetron (ZOFRAN) IV, mouth rinse, traZODone  Physical Exam Pulmonary:     Effort: Pulmonary effort is normal.  Neurological:     Mental Status: She is alert.             Vital Signs: BP (!) 118/47 (BP Location: Left Arm)   Pulse 68    Temp 97.9 F (36.6 C)   Resp 18   Ht 5\' 1"  (1.549 m)   Wt 109.2 kg   SpO2 96%   BMI 45.49 kg/m  SpO2: SpO2: 96 % O2 Device: O2 Device: Room Air O2 Flow Rate:    Intake/output summary:  Intake/Output Summary (Last 24 hours) at 08/20/2023 1552 Last data  filed at 08/19/2023 2130 Gross per 24 hour  Intake 220 ml  Output --  Net 220 ml   LBM: Last BM Date : 08/15/23 Baseline Weight: Weight: 109.2 kg Most recent weight: Weight: 109.2 kg   Patient Active Problem List   Diagnosis Date Noted   Arm laceration, right, initial encounter 08/18/2023   Recurrent falls 08/18/2023   ABLA (acute blood loss anemia) 08/18/2023   AKI (acute kidney injury) (HCC) 08/16/2023   Dizziness 08/16/2023   Dyslipidemia 08/16/2023   GERD without esophagitis 08/16/2023   Peripheral neuropathy 08/16/2023   Essential hypertension 08/16/2023   NSTEMI (non-ST elevated myocardial infarction) (HCC) 10/06/2017   COPD exacerbation (HCC) 02/06/2016   HLD (hyperlipidemia) 02/06/2016   HTN (hypertension) 02/06/2016   GERD (gastroesophageal reflux disease) 02/06/2016    Palliative Care Assessment & Plan    Recommendations/Plan: PMT to follow-up tomorrow.  Patient advises that her son who is HPOA is running errands and looking at facilities and discharge planning, and is unable to be here to talk today.  Code Status:    Code Status Orders  (From admission, onward)           Start     Ordered   08/16/23 0356  Full code  Continuous       Question:  By:  Answer:  Consent: discussion documented in EHR   08/16/23 0400           Code Status History     Date Active Date Inactive Code Status Order ID Comments User Context   10/06/2017 1632 10/07/2017 1923 Full Code [id number]  Enid Baas, MD ED   02/06/2016 0451 02/07/2016 1818 Full Code [id number]  Oralia Manis, MD Inpatient       Thank you for allowing the Palliative Medicine Team to assist in the care of this patient.   Morton Stall, NP  Please contact Palliative Medicine Team phone at [phone] for questions and concerns.

## 2023-08-20 NOTE — Plan of Care (Signed)

## 2023-08-20 NOTE — Progress Notes (Signed)
Mobility Specialist - Progress Note   08/20/23 1421  Mobility  Activity Ambulated with assistance in hallway  Level of Assistance Contact guard assist, steadying assist  Assistive Device Front wheel walker  Distance Ambulated (ft) 160 ft  Activity Response Tolerated well  $Mobility charge 1 Mobility     Pt lying in bed upon arrival, utilizing RA. Pt agreeable to activity. Completed bed mobility with supervision. STS and ambulation with CGA. VC to stay inside RW especially during turns. No LOB. Pt returned to room with alarm set, needs in reach.    Filiberto Pinks Mobility Specialist 08/20/23, 2:22 PM

## 2023-08-20 NOTE — TOC Progression Note (Signed)
Transition of Care [facility]) - Progression Note    Patient Details  Name: Sandra Delgado MRN: [id number] Date of Birth: [birth year]  Transition of [facility]) CM/SW Contact  Marlowe Sax, RN Phone Number: 08/20/2023, 9:02 AM  Clinical Narrative:    Resent the bedsearch, no beds available yet     Barriers to Discharge: Barriers Resolved  Expected Discharge Plan and Services         Expected Discharge Date: 08/17/23                         HH Arranged: PT, OT HH Agency: [facility] Date [facility] Agency Contacted: 08/17/23 Time HH Agency Contacted: 1623 Representative spoke with at [facility] Agency: [facility]   Social Determinants of Health (SDOH) Interventions SDOH Screenings   Food Insecurity: No Food Insecurity (04/16/2023)   Received from [facility]  Transportation Needs: No Transportation Needs (04/16/2023)   Received from [facility]  Utilities: Not At Risk (04/16/2023)   Received from [facility]  Financial Resource Strain: Low Risk  (04/16/2023)   Received from [facility]  Tobacco Use: Low Risk  (08/15/2023)    Readmission Risk Interventions     No data to display

## 2023-08-20 NOTE — Progress Notes (Signed)
Physical Therapy Treatment Patient Details Name: Sandra Delgado MRN: [id number] DOB: [birth year] Today's Date: 08/20/2023   History of Present Illness Pt is an 89 y.o. female with medical history significant for COPD, hypertension, dyslipidemia, IBS, lymphoma, migraine and osteoarthritis, who presented to the emergency room with acute onset of dizziness and fall.  MD assessment includes: NSTEMI, elevated troponin, AKI, dizziness with suspected syncope, and RUE laceration/edema.    PT Comments  Patient received in bed, she is pleasant and agreeable to PT session. Son is at bedside. We discussed home vs. SNF. Patient is mod I with bed mobility. Transfers with supervision and cues. Ambulated 160 feet with RW and cga/supervision with cues for safe use of AD. She performed self care in bathroom prior to ambulation. Patient is making excellent progress and will continue to benefit from skilled PT while here to ensure safety with mobility.      If plan is discharge home, recommend the following: A little help with walking and/or transfers;A little help with bathing/dressing/bathroom;Assistance with cooking/housework;Direct supervision/assist for medications management;Help with stairs or ramp for entrance;Assist for transportation;Supervision due to cognitive status;Direct supervision/assist for financial management   Can travel by private vehicle     Yes  Equipment Recommendations  Rolling walker (2 wheels)    Recommendations for Other Services       Precautions / Restrictions Precautions Precautions: Fall Restrictions Weight Bearing Restrictions: No     Mobility  Bed Mobility Overal bed mobility: Modified Independent Bed Mobility: Sidelying to Sit, Rolling Rolling: Modified independent (Device/Increase time) Sidelying to sit: Modified independent (Device/Increase time), HOB elevated       General bed mobility comments: use of  bed rails, no physical assist needed     Transfers Overall transfer level: Needs assistance Equipment used: Rolling walker (2 wheels) Transfers: Sit to/from Stand Sit to Stand: Supervision           General transfer comment: cues for hand placement, but able to stand from bed and toilet without assistance other than supervision.    Ambulation/Gait Ambulation/Gait assistance: Contact guard assist Gait Distance (Feet): 160 Feet Assistive device: Rolling walker (2 wheels) Gait Pattern/deviations: Step-through pattern, Decreased stride length, Decreased step length - right, Decreased step length - left Gait velocity: decreased     General Gait Details: cues needed for poximity to RW, no lob during session. cga/supervision   Stairs             Wheelchair Mobility     Tilt Bed    Modified Rankin (Stroke Patients Only)       Balance Overall balance assessment: Needs assistance, History of Falls Sitting-balance support: Feet supported Sitting balance-Leahy Scale: Normal     Standing balance support: Bilateral upper extremity supported, During functional activity, Reliant on assistive device for balance Standing balance-Leahy Scale: Good Standing balance comment: cga/supervision                            Cognition Arousal: Alert Behavior During Therapy: WFL for tasks assessed/performed Overall Cognitive Status: Within Functional Limits for tasks assessed Area of Impairment: Memory                     Memory: Decreased short-term memory Following Commands: Follows one step commands consistently                Exercises      General Comments        Pertinent Vitals/Pain  Pain Assessment Pain Assessment: No/denies pain    Home Living Family/patient expects to be discharged to:: Private residence Living Arrangements: Alone Available Help at Discharge: Family;Available PRN/intermittently Type of Home: House Home Access: Stairs to enter Entrance Stairs-Rails:  Doctor, general practice of Steps: 3   Home Layout: One level Home Equipment: Grab bars - tub/shower      Prior Function            PT Goals (current goals can now be found in the care plan section) Acute Rehab PT Goals Patient Stated Goal: To walk better and to get back to exercising at the senior center PT Goal Formulation: With patient Time For Goal Achievement: 08/31/23 Potential to Achieve Goals: Good Progress towards PT goals: Progressing toward goals    Frequency    Min 1X/week      PT Plan      Co-evaluation              AM-PAC PT "6 Clicks" Mobility   Outcome Measure  Help needed turning from your back to your side while in a flat bed without using bedrails?: A Little Help needed moving from lying on your back to sitting on the side of a flat bed without using bedrails?: A Little Help needed moving to and from a bed to a chair (including a wheelchair)?: A Little Help needed standing up from a chair using your arms (e.g., wheelchair or bedside chair)?: A Little Help needed to walk in hospital room?: A Little Help needed climbing 3-5 steps with a railing? : A Little 6 Click Score: 18    End of Session Equipment Utilized During Treatment: Gait belt Activity Tolerance: Patient tolerated treatment well Patient left: in bed;with call bell/phone within reach;with bed alarm set;with family/visitor present Nurse Communication: Mobility status PT Visit Diagnosis: Muscle weakness (generalized) (M62.81);Difficulty in walking, not elsewhere classified (R26.2);History of falling (Z91.81)     Time: 4696-2952 PT Time Calculation (min) (ACUTE ONLY): 21 min  Charges:    $Gait Training: 8-22 mins PT General Charges $$ ACUTE PT VISIT: 1 Visit                     Hollie Wojahn, PT, GCS 08/20/23,11:54 AM

## 2023-08-20 NOTE — TOC Progression Note (Signed)
Transition of Care [facility]) - Progression Note    Patient Details  Name: LISL KENNIS MRN: [id number] Date of Birth: Sep 14, 1933  [facility]) CM/SW Contact  Marlowe Sax, RN Phone Number: 08/20/2023, 10:47 AM  Clinical Narrative:     Spoke with the son [name] the son, he stated that the patient has been to [facility] before and does not want to go there, I explained to him that [facility] is not going to be an option that they are full I Explained that I will send out to the surrounding area he is agreeable, I explained the guideline is that when a patient has a bed offer that they can not wait in the hospital to get an offer to a place they want, he stated understanding    Barriers to Discharge: Barriers Resolved  Expected Discharge Plan and Services         Expected Discharge Date: 08/17/23                         HH Arranged: PT, OT HH Agency: [facility] Date [facility] Agency Contacted: 08/17/23 Time HH Agency Contacted: 1623 Representative spoke with at [facility] Agency: [facility]   Social Determinants of Health (SDOH) Interventions SDOH Screenings   Food Insecurity: No Food Insecurity (04/16/2023)   Received from [facility]  Transportation Needs: No Transportation Needs (04/16/2023)   Received [facility]  Utilities: Not At Risk (04/16/2023)   Received from [facility]  Financial Resource Strain: Low Risk  (04/16/2023)   Received from [facility]  Tobacco Use: Low Risk  (08/15/2023)    Readmission Risk Interventions     No data to display

## 2023-08-20 NOTE — TOC Progression Note (Signed)
Transition of Care [facility]) - Progression Note    Patient Details  Name: Sandra Delgado MRN: [id number] Date of Birth: [birth year]  Transition of Care [facility]) CM/SW Contact  Marlowe Sax, RN Phone Number: 08/20/2023, 10:34 AM  Clinical Narrative:     Sherron Monday to son Sandra Delgado, I explained that at this time we have one bed offer at [facility] He stated that they were really hoping for [facility], I explained that at this time, no other offers are in and I did reach out to Walker at [facility] asking if they can review for a bed offer    Barriers to Discharge: Barriers Resolved  Expected Discharge Plan and Services         Expected Discharge Date: 08/17/23                         HH Arranged: PT, OT HH Agency: [facility] Date [facility] Agency Contacted: 08/17/23 Time HH Agency Contacted: 1623 Representative spoke with at [facility] Agency: [facility]   Social Determinants of Health (SDOH) Interventions SDOH Screenings   Food Insecurity: No Food Insecurity (04/16/2023)   Received from [facility]  Transportation Needs: No Transportation Needs (04/16/2023)   Received from [facility]  Utilities: Not At Risk (04/16/2023)   Received from [facility]  Financial Resource Strain: Low Risk  (04/16/2023)   Received from [facility]  Tobacco Use: Low Risk  (08/15/2023)    Readmission Risk Interventions     No data to display

## 2023-08-21 DIAGNOSIS — I214 Non-ST elevation (NSTEMI) myocardial infarction: Secondary | ICD-10-CM | POA: Diagnosis not present

## 2023-08-21 DIAGNOSIS — N179 Acute kidney failure, unspecified: Secondary | ICD-10-CM | POA: Diagnosis not present

## 2023-08-21 DIAGNOSIS — Z7189 Other specified counseling: Secondary | ICD-10-CM | POA: Diagnosis not present

## 2023-08-21 DIAGNOSIS — D62 Acute posthemorrhagic anemia: Secondary | ICD-10-CM | POA: Diagnosis not present

## 2023-08-21 DIAGNOSIS — R42 Dizziness and giddiness: Secondary | ICD-10-CM | POA: Diagnosis not present

## 2023-08-21 LAB — BASIC METABOLIC PANEL
Anion gap: 14 (ref 5–15)
BUN: 17 mg/dL (ref 8–23)
CO2: 23 mmol/L (ref 22–32)
Calcium: 9.2 mg/dL (ref 8.9–10.3)
Chloride: 103 mmol/L (ref 98–111)
Creatinine, Ser: 0.99 mg/dL (ref 0.44–1.00)
GFR, Estimated: 55 mL/min — ABNORMAL LOW (ref >60)
Glucose, Bld: 101 mg/dL — ABNORMAL HIGH (ref 70–99)
Potassium: 4.1 mmol/L (ref 3.5–5.1)
Sodium: 140 mmol/L (ref 135–145)

## 2023-08-21 MED ORDER — CEPHALEXIN 500 MG PO CAPS: 500.0000 mg | ORAL_CAPSULE | Freq: Four times a day (QID) | ORAL | 0 refills | Status: AC

## 2023-08-21 MED ORDER — DOXYCYCLINE HYCLATE 100 MG PO TABS: 100.0000 mg | ORAL_TABLET | Freq: Two times a day (BID) | ORAL | 0 refills | Status: AC

## 2023-08-21 NOTE — Progress Notes (Signed)
Physical Therapy Treatment Patient Details Name: Sandra Delgado MRN: [id number] DOB: 1933-10-25 Today's Date: 08/21/2023   History of Present Illness Pt is an 89 y.o. female with medical history significant for COPD, hypertension, dyslipidemia, IBS, lymphoma, migraine and osteoarthritis, who presented to the emergency room with acute onset of dizziness and fall.  MD assessment includes: NSTEMI, elevated troponin, AKI, dizziness with suspected syncope, and RUE laceration/edema.    PT Comments  Author returns for 2nd session today to trial stairs training in preparation for DC to home. Pt demonstrate performance of 8 stairs, uses 2 rails for balance, no LOB, reports "that was easy". Pt assisted back to room, then AMB without device to BR after pt voices concerns about fast transit bowels. Pt left in BR with RN in room. All mobility benchmarks have been met with PT, should be able to safely transition to home with family assist once medically ready.    If plan is discharge home, recommend the following: A little help with walking and/or transfers;A little help with bathing/dressing/bathroom;Assistance with cooking/housework;Direct supervision/assist for medications management;Help with stairs or ramp for entrance;Assist for transportation;Supervision due to cognitive status;Direct supervision/assist for financial management   Can travel by private vehicle     Yes  Equipment Recommendations  Rolling walker (2 wheels)    Recommendations for Other Services       Precautions / Restrictions Precautions Precautions: Fall Restrictions Weight Bearing Restrictions: No     Mobility  Bed Mobility               General bed mobility comments: in chair on arrival    Transfers Overall transfer level: Needs assistance Equipment used: Rolling walker (2 wheels) Transfers: Sit to/from Stand Sit to Stand: Supervision           General transfer comment: extensive cues for safe approach to  chair with RW, poor problem solving in tight spaces    Ambulation/Gait Ambulation/Gait assistance: Contact guard assist Gait Distance (Feet): 25 Feet Assistive device: None Gait Pattern/deviations: Step-through pattern Gait velocity: 0.18m/s     General Gait Details: AMB recliner to potty, hands free, device free, to work on dynamic balance in a safe context   Stairs Stairs: Yes Stairs assistance: Contact guard assist Stair Management: Two rails, Forwards Number of Stairs: 8 General stair comments: "that was easy"   Wheelchair Mobility     Tilt Bed    Modified Rankin (Stroke Patients Only)       Balance Overall balance assessment: Modified Independent                                          Cognition Arousal: Alert Behavior During Therapy: WFL for tasks assessed/performed Overall Cognitive Status: Within Functional Limits for tasks assessed Area of Impairment: Memory (more forgetful this session)                     Memory: Decreased short-term memory                  Exercises Other Exercises Other Exercises: standing at cabinet tooth brushing Other Exercises: AMB to BR and toiletting    General Comments        Pertinent Vitals/Pain Pain Assessment Pain Assessment: No/denies pain    Home Living  Prior Function            PT Goals (current goals can now be found in the care plan section) Acute Rehab PT Goals Patient Stated Goal: To walk better and to get back to exercising at the senior center PT Goal Formulation: With patient Time For Goal Achievement: 08/31/23 Potential to Achieve Goals: Good Progress towards PT goals: Progressing toward goals    Frequency    Min 1X/week      PT Plan      Co-evaluation              AM-PAC PT "6 Clicks" Mobility   Outcome Measure  Help needed turning from your back to your side while in a flat bed without using bedrails?:  A Little Help needed moving from lying on your back to sitting on the side of a flat bed without using bedrails?: A Little Help needed moving to and from a bed to a chair (including a wheelchair)?: A Little Help needed standing up from a chair using your arms (e.g., wheelchair or bedside chair)?: A Little Help needed to walk in hospital room?: A Little Help needed climbing 3-5 steps with a railing? : A Little 6 Click Score: 18    End of Session Equipment Utilized During Treatment: Gait belt Activity Tolerance: Patient tolerated treatment well;No increased pain Patient left: with call bell/phone within reach (toilet) Nurse Communication: Mobility status PT Visit Diagnosis: Muscle weakness (generalized) (M62.81);Difficulty in walking, not elsewhere classified (R26.2);History of falling (Z91.81)     Time: 2725-3664 PT Time Calculation (min) (ACUTE ONLY): 13 min  Charges:    $Gait Training: 8-22 mins $Therapeutic Activity: 8-22 mins PT General Charges $$ ACUTE PT VISIT: 1 Visit                3:05 PM, 08/21/23 Sandra Delgado, PT, DPT Physical Therapist - [facility]  [phone] (ASCOM)    Sandra Delgado 08/21/2023, 3:03 PM

## 2023-08-21 NOTE — Progress Notes (Signed)
Mobility Specialist - Progress Note   08/21/23 1100  Mobility  Activity Ambulated with assistance in hallway;Ambulated with assistance to bathroom  Level of Assistance Contact guard assist, steadying assist  Assistive Device Front wheel walker  Distance Ambulated (ft) 200 ft  Activity Response Tolerated well  $Mobility charge 1 Mobility     Pt sitting in recliner upon arrival, utilizing RA. Pt completed STS with CGA. Ambulated to bathroom for urinal out prior to continuation into hallway. Pt demonstrated ability to stay inside RW this date. No dizziness/complaints. Pt returned to chair with needs in reach.   Filiberto Pinks Mobility Specialist 08/21/23, 11:19 AM

## 2023-08-21 NOTE — Discharge Summary (Addendum)
Physician Discharge Summary  Sandra Delgado MWN:[id number] DOB: [birth year] DOA: 08/15/2023  PCP: Lauro Regulus, MD  Admit date: 08/15/2023 Discharge date: 08/21/2023  Admitted From: Home  Discharge disposition: Home health  Recommendations for Outpatient Follow-Up:   Follow up with your primary care provider in one week.  Check CBC, BMP, magnesium in the next visit Patient will need to follow-up with Dr.Custovic, Cardiology in 1-2 weeks Cardiology to arrange for 14-day monitor on discharge.  Discharge Diagnosis:   Principal Problem:   NSTEMI (non-ST elevated myocardial infarction) (HCC) Active Problems:   AKI (acute kidney injury) (HCC)   Dizziness   Dyslipidemia   GERD without esophagitis   Peripheral neuropathy   Essential hypertension   Arm laceration, right, initial encounter   Recurrent falls   ABLA (acute blood loss anemia)   Discharge Condition: Improved.  Diet recommendation: Low sodium, heart healthy.    Wound care: None.  Code status: Full.   History of Present Illness:   Sandra Delgado is a 89 y.o. Caucasian female with medical history significant for COPD, hypertension, dyslipidemia, IBS, lymphoma, migraine and osteoarthritis, who presented to the emergency room with acute onset of dizziness when she went to the bathroom with possibly presyncope.  She had a similar episode last week when she fell down and hit her head.  In the ED, patient was noted to be orthostatic and received IV fluids.  CT head scan was negative.  Chest x-ray was negative for infiltrates.  Urinalysis negative for infection.  X-ray of the pelvis was negative for any fractures.  Patient had a right forearm skin tear and laceration that was sutured in the ED.  She was also noted to have elevated troponin and cardiology was consulted.  Patient was then admitted hospital for further evaluation and treatment.  Hospital Course:   Following conditions were addressed during  hospitalization as listed below,  NSTEMI (non-ST elevated myocardial infarction)  Cardiology was consulted and conservative treatment was pursued during hospitalization.  Patient was initially on IV heparin.  Of note patient had Nuclear stress test was normal and 2D echocardiogram showed preserved ejection fraction with no regional wall motion abnormality.  Patient will need to follow-up with Dr.Custovic, Cardiology in 1-2 weeks. 14 day cardiac monitor placed for further evaluation of apparent recurrent syncopal episodes.  Continue aspirin, Lipitor, losartan, Coreg on discharge.   AKI (acute kidney injury) on CKD IIIa Resolved with hydration.  Latest creatinine of 0.9   Hypokalemia.  Improved after replacement.  Potassium prior to discharge was 4.1.   Dizziness/Recurrent falls. Suspected syncope due to possible orthostatic hypotension.  Resolved at this time.  CT head was negative. D-dimer was mildly elevated, thus CTA chest obtained and pulmonary embolism was ruled out.   PT has recommended skilled nursing facility placement but plan is is home with home health on discharge..  Plan for 14-day monitor on discharge.  Communicated  with cardiology prior to disposition regarding monitor placement.   ABLA (acute blood loss anemia) From right arm laceration bleeding while on heparin.  Hbg on admission 11.8, hemoglobin at 7.8.  No further bleed.  Would recommend CBC follow-up as outpatient.   Arm laceration, right, initial encounter  Right hand edema Sustained during fall, apparent syncopal episode.  Sutured in the ED. Wound care  Continue Keflex and Doxy for next 2 days to complete the course. Elevate right upper extremity. Avoid over-compression with dressings.  Dressing orders in place.   Essential hypertension Continue Coreg, losartan.  Peripheral neuropathy Continue Neurontin.   GERD without esophagitis Supportive care.  Dyslipidemia --Continue statin   Cognitive dysfunction  possible mild dementia.  Supportive care.   Morbid obesity. Body mass index is 45.49 kg/m.   Would benefit from weight loss as outpatient.   Disposition.  At this time, patient is stable for disposition home with outpatient PCP and cardiology follow-up.  I tried to reach out to the patient's son today but unable to reach him.  Spoke with him on 08/20/2023  Medical Consultants:   Cardiology Palliative care  Procedures:    Suture of laceration of the arm Nuclear stress test. Subjective:   Today, patient was seen and examined at bedside.  Has had a bowel movement.  Denies any nausea, vomiting abdominal pain.  Denies any chest pain, shortness of breath, dizziness or lightheadedness.  Discharge Exam:   Vitals:   08/20/23 2331 08/21/23 0839  BP: (!) 143/64 (!) 140/55  Pulse: 78 70  Resp: 17   Temp: (!) 97.5 F (36.4 C) 98.1 F (36.7 C)  SpO2: 98% 97%   Vitals:   08/20/23 0832 08/20/23 1648 08/20/23 2331 08/21/23 0839  BP: (!) 118/47 (!) 138/48 (!) 143/64 (!) 140/55  Pulse: 68 79 78 70  Resp: 18 17 17    Temp: 97.9 F (36.6 C) 98.1 F (36.7 C) (!) 97.5 F (36.4 C) 98.1 F (36.7 C)  TempSrc: Oral Oral    SpO2: 96% 98% 98% 97%  Weight:      Height:       Body mass index is 45.49 kg/m.   General: Alert awake, not in obvious distress, obese, elderly female, HENT: pupils equally reacting to light,  No scleral pallor or icterus noted. Oral mucosa is moist.  Chest:   Diminished breath sounds bilaterally. No crackles or wheezes.  CVS: S1 &S2 heard. No murmur.  Regular rate and rhythm. Abdomen: Soft, nontender, nondistended.  Bowel sounds are heard.   Extremities: No cyanosis, clubbing or edema.  Peripheral pulses are palpable. Psych: Alert, awake and oriented, normal mood CNS:  No cranial nerve deficits.  Power equal in all extremities.   Skin: Warm and dry.  No rashes noted.  The results of significant diagnostics from this hospitalization (including imaging,  microbiology, ancillary and laboratory) are listed below for reference.     Diagnostic Studies:   NM Myocar Multi W/Spect W/Wall Motion / EF  Result Date: 08/20/2023   The study is normal. The study is low risk.   No ST deviation was noted.   Left ventricular function is normal. Nuclear stress EF: 60%. The left ventricular ejection fraction is normal (55-65%). End diastolic cavity size is normal. 1.  No evidence for scar or ischemia 2.  Normal left ventricular function 3.  Low risk study   ECHOCARDIOGRAM COMPLETE  Result Date: 08/17/2023    ECHOCARDIOGRAM REPORT   Patient Name:   Sandra Delgado Date of Exam: 08/17/2023 Medical Rec #:  [id number]      Height:       61.0 in Accession #:    [id number]     Weight:       240.7 lb Date of Birth:  [birth year]      BSA:          2.044 m Patient Age:    89 years       BP:           134/54 mmHg Patient Gender: F  HR:           63 bpm. Exam Location:  [facility] Procedure: 2D Echo, Cardiac Doppler and Color Doppler Indications:     NSTEMI I21.4  History:         Patient has no prior history of Echocardiogram examinations.                  COPD; Risk Factors:Hypertension and Dyslipidemia.  Sonographer:     Cristela Blue Referring Phys:  [id number] CARALYN HUDSON Diagnosing Phys: Rozell Searing Custovic IMPRESSIONS  1. Left ventricular ejection fraction, by estimation, is 65 to 70%. The left ventricle has normal function. The left ventricle has no regional wall motion abnormalities. Left ventricular diastolic parameters are consistent with Grade II diastolic dysfunction (pseudonormalization).  2. Right ventricular systolic function is normal. The right ventricular size is normal.  3. The mitral valve is grossly normal. Trivial mitral valve regurgitation.  4. The aortic valve is grossly normal. Aortic valve regurgitation is not visualized. No aortic stenosis is present. FINDINGS  Left Ventricle: Left ventricular ejection fraction, by estimation, is 65 to 70%. The left ventricle  has normal function. The left ventricle has no regional wall motion abnormalities. The left ventricular internal cavity size was normal in size. There is  no left ventricular hypertrophy. Left ventricular diastolic parameters are consistent with Grade II diastolic dysfunction (pseudonormalization). Right Ventricle: The right ventricular size is normal. No increase in right ventricular wall thickness. Right ventricular systolic function is normal. Left Atrium: Left atrial size was normal in size. Right Atrium: Right atrial size was normal in size. Pericardium: There is no evidence of pericardial effusion. Mitral Valve: The mitral valve is grossly normal. Trivial mitral valve regurgitation. MV peak gradient, 8.0 mmHg. The mean mitral valve gradient is 4.0 mmHg. Tricuspid Valve: The tricuspid valve is grossly normal. Tricuspid valve regurgitation is trivial. Aortic Valve: The aortic valve is grossly normal. Aortic valve regurgitation is not visualized. No aortic stenosis is present. Aortic valve mean gradient measures 3.0 mmHg. Aortic valve peak gradient measures 6.2 mmHg. Aortic valve area, by VTI measures 1.80 cm. Pulmonic Valve: The pulmonic valve was grossly normal. Pulmonic valve regurgitation is not visualized. Aorta: The aortic root is normal in size and structure. IAS/Shunts: No atrial level shunt detected by color flow Doppler.  LEFT VENTRICLE PLAX 2D LVIDd:         3.80 cm   Diastology LVIDs:         2.10 cm   LV e' medial:    5.33 cm/s LV PW:         1.10 cm   LV E/e' medial:  17.1 LV IVS:        0.90 cm   LV e' lateral:   7.51 cm/s LVOT diam:     2.00 cm   LV E/e' lateral: 12.1 LV SV:         48 LV SV Index:   24 LVOT Area:     3.14 cm  RIGHT VENTRICLE RV Basal diam:  3.10 cm RV Mid diam:    1.40 cm LEFT ATRIUM             Index       RIGHT ATRIUM          Index LA diam:        2.00 cm 0.98 cm/m  RA Area:     8.52 cm LA Vol (A2C):   18.5 ml 9.05 ml/m  RA Volume:   16.90 ml  8.27 ml/m LA Vol (A4C):    13.5 ml 6.61 ml/m LA Biplane Vol: 17.0 ml 8.32 ml/m  AORTIC VALVE AV Area (Vmax):    1.96 cm AV Area (Vmean):   1.82 cm AV Area (VTI):     1.80 cm AV Vmax:           124.00 cm/s AV Vmean:          85.200 cm/s AV VTI:            0.267 m AV Peak Grad:      6.2 mmHg AV Mean Grad:      3.0 mmHg LVOT Vmax:         77.40 cm/s LVOT Vmean:        49.400 cm/s LVOT VTI:          0.153 m LVOT/AV VTI ratio: 0.57  AORTA Ao Root diam: 3.10 cm MITRAL VALVE                TRICUSPID VALVE MV Area (PHT): 4.96 cm     TR Peak grad:   32.9 mmHg MV Area VTI:   1.43 cm     TR Vmax:        287.00 cm/s MV Peak grad:  8.0 mmHg MV Mean grad:  4.0 mmHg     SHUNTS MV Vmax:       1.41 m/s     Systemic VTI:  0.15 m MV Vmean:      98.9 cm/s    Systemic Diam: 2.00 cm MV Decel Time: 153 msec MV E velocity: 91.20 cm/s MV A velocity: 120.00 cm/s MV E/A ratio:  0.76 Designer, multimedia signed by Clotilde Dieter Signature Date/Time: 08/17/2023/9:46:23 AM    Final      Labs:   Basic Metabolic Panel: Recent Labs  Lab 08/16/23 0424 08/17/23 0502 08/19/23 0325 08/20/23 0416 08/21/23 0546  NA 137 141 138 137 140  K 3.9 3.7 3.4* 3.4* 4.1  CL 107 109 109 110 103  CO2 24 21* 23 23 23   GLUCOSE 108* 89 110* 115* 101*  BUN 20 14 14 20 17   CREATININE 1.20* 0.99 0.89 1.08* 0.99  CALCIUM 8.3* 8.2* 8.2* 8.0* 9.2  MG  --   --   --  1.9  --   PHOS  --   --   --  2.5  --    GFR Estimated Creatinine Clearance: 44.9 mL/min (by C-G formula based on SCr of 0.99 mg/dL). Liver Function Tests: Recent Labs  Lab 08/16/23 0000  AST 21  ALT 13  ALKPHOS 41  BILITOT 1.0  PROT 6.8  ALBUMIN 3.8   No results for input(s): "LIPASE", "AMYLASE" in the last 168 hours. No results for input(s): "AMMONIA" in the last 168 hours. Coagulation profile Recent Labs  Lab 08/16/23 0424  INR 1.1    CBC: Recent Labs  Lab 08/16/23 0000 08/16/23 0424 08/17/23 0502 08/18/23 0938 08/19/23 0325 08/20/23 0416  WBC 7.2 8.6 7.8 9.2 6.9 6.9   NEUTROABS 3.9  --   --   --   --   --   HGB 12.8 11.8* 9.9* 8.7* 8.0* 7.8*  HCT 38.4 35.2* 28.4* 25.6* 22.9* 22.9*  MCV 95.3 96.4 94.4 94.1 94.6 94.6  PLT 230 222 185 184 169 188   Cardiac Enzymes: No results for input(s): "CKTOTAL", "CKMB", "CKMBINDEX", "TROPONINI" in the last 168 hours. BNP: Invalid input(s): "POCBNP" CBG: No results for input(s): "GLUCAP" in the last 168 hours. D-Dimer  No results for input(s): "DDIMER" in the last 72 hours. Hgb A1c No results for input(s): "HGBA1C" in the last 72 hours. Lipid Profile No results for input(s): "CHOL", "HDL", "LDLCALC", "TRIG", "CHOLHDL", "LDLDIRECT" in the last 72 hours. Thyroid function studies No results for input(s): "TSH", "T4TOTAL", "T3FREE", "THYROIDAB" in the last 72 hours.  Invalid input(s): "FREET3" Anemia work up No results for input(s): "VITAMINB12", "FOLATE", "FERRITIN", "TIBC", "IRON", "RETICCTPCT" in the last 72 hours. Microbiology No results found for this or any previous visit (from the past 240 hour(s)).   Discharge Instructions:   Discharge Instructions     Call MD for:  extreme fatigue   Complete by: As directed    Call MD for:  persistant dizziness or light-headedness   Complete by: As directed    Call MD for:  persistant nausea and vomiting   Complete by: As directed    Call MD for:  redness, tenderness, or signs of infection (pain, swelling, redness, odor or green/yellow discharge around incision site)   Complete by: As directed    Call MD for:  redness, tenderness, or signs of infection (pain, swelling, redness, odor or green/yellow discharge around incision site)   Complete by: As directed    Call MD for:  severe uncontrolled pain   Complete by: As directed    Call MD for:  severe uncontrolled pain   Complete by: As directed    Call MD for:  temperature >100.4   Complete by: As directed    Call MD for:  temperature >100.4   Complete by: As directed    Diet - low sodium heart healthy    Complete by: As directed    Discharge instructions   Complete by: As directed    Follow up with Cardiology in clinic to discuss results of your heart monitor.  You can follow up with Primary Care for your arm wound and suture removal. Typically sutures stay in place for about 7-10 days, depending on healing.  Please make a primary care appointment for later this week to check on it.  Dressing changes --- you can keep the area covered with non-stick dressing and wrapped (like it is now) with "Kerlix" which is available at the pharmacy stores.    You can clean the arm between dressing changes with sterile saline and pat dry.    If you notice increased redness, swelling or cloudy drainage from the area, notify your doctor or return to the ER to be evaluated as these are signs of infection.   Discharge instructions   Complete by: As directed    Follow-up with your primary care provider as outpatient.  Complete the course of antibiotic.  Seek medical attention for worsening symptoms.  Continue physical therapy at home.  Follow-up with Dr. Melton Alar cardiology in 1 to 2 weeks.   Discharge wound care:   Complete by: As directed    As stated above   Discharge wound care:   Complete by: As directed    Cleanse wound gently with sterile saline.   Cover with Xeroform, non-stick gauze followed by gauze dressing and Kerlix.  Avoid over-compression of the arm with dressings. If hand if swelling severely, re-dress more loosely.   Increase activity slowly   Complete by: As directed    Increase activity slowly   Complete by: As directed       Allergies as of 08/21/2023       Reactions   Ace Inhibitors Other (See Comments)   Reaction: unknown  Amoxicillin Diarrhea   Has patient had a PCN reaction causing immediate rash, facial/tongue/throat swelling, SOB or lightheadedness with hypotension: No Has patient had a PCN reaction causing severe rash involving mucus membranes or skin necrosis: No Has  patient had a PCN reaction that required hospitalization No Has patient had a PCN reaction occurring within the last 10 years: Yes If all of the above answers are "NO", then may proceed with Cephalosporin use.   Atorvastatin Other (See Comments)   Reaction: muscle pain   Ezetimibe Other (See Comments)   Reaction: fatigue   Niacin And Related Other (See Comments)   Reaction: flushing        Medication List     TAKE these medications    aspirin EC 81 MG tablet Take 1 tablet (81 mg total) by mouth daily.   atorvastatin 40 MG tablet Commonly known as: LIPITOR Take 1 tablet (40 mg total) by mouth daily at 6 PM.   carvedilol 3.125 MG tablet Commonly known as: COREG Take 3.125 mg by mouth 2 (two) times daily with a meal.   cephALEXin 500 MG capsule Commonly known as: KEFLEX Take 1 capsule (500 mg total) by mouth 4 (four) times daily for 2 days.   clopidogrel 75 MG tablet Commonly known as: PLAVIX Take 75 mg by mouth daily.   doxycycline 100 MG tablet Commonly known as: VIBRA-TABS Take 1 tablet (100 mg total) by mouth every 12 (twelve) hours for 2 days.   gabapentin 300 MG capsule Commonly known as: NEURONTIN Take 300 mg by mouth at bedtime.   losartan 100 MG tablet Commonly known as: COZAAR Take 100 mg by mouth daily.   omeprazole 40 MG capsule Commonly known as: PRILOSEC Take 40 mg by mouth 2 (two) times daily.               Durable Medical Equipment  (From admission, onward)           Start     Ordered   08/20/23 1215  For home use only DME Walker rolling  Once       Question Answer Comment  Walker: With 5 Inch Wheels   Patient needs a walker to treat with the following condition Impaired mobility      08/20/23 1214              Discharge Care Instructions  (From admission, onward)           Start     Ordered   08/21/23 0000  Discharge wound care:       Comments: Cleanse wound gently with sterile saline.   Cover with Xeroform,  non-stick gauze followed by gauze dressing and Kerlix.  Avoid over-compression of the arm with dressings. If hand if swelling severely, re-dress more loosely.   08/21/23 1402   08/17/23 0000  Discharge wound care:       Comments: As stated above   08/17/23 1447            Follow-up Information     Custovic, Rozell Searing, DO. Go on 09/02/2023.   Specialty: Cardiology Why: Appt @ 3:45 pm Contact information: [address] [place] [address] [phone]         Lauro Regulus, MD. Go on 08/27/2023.   Specialty: Internal Medicine Why: Appt @ 9:15 am Contact information: [address] [facility] [place] [address] [phone]  Time coordinating discharge: 39 minutes  Signed:  Sybil Shrader  [facility] 08/21/2023, 3:16 PM

## 2023-08-21 NOTE — Plan of Care (Signed)
  Problem: Activity: Goal: Ability to tolerate increased activity will improve Outcome: Progressing   Problem: Cardiac: Goal: Ability to achieve and maintain adequate cardiovascular perfusion will improve Outcome: Progressing   Problem: Nutrition: Goal: Adequate nutrition will be maintained Outcome: Progressing   Problem: Activity: Goal: Risk for activity intolerance will decrease Outcome: Progressing   Problem: Pain Managment: Goal: General experience of comfort will improve Outcome: Progressing   Problem: Skin Integrity: Goal: Risk for impaired skin integrity will decrease Outcome: Progressing

## 2023-08-21 NOTE — Consult Note (Addendum)
VAST: Consult placed for PIV  Secure chat sent to care RN advising PIV is not needed at this d/t no IV meds in [facility]   Secure chat acknowledged by care RN. Order canceled. Care RN will place new order if pt need changes.

## 2023-08-21 NOTE — Progress Notes (Signed)
Daily Progress Note   Patient Name: Sandra Delgado       Date: 08/21/2023 DOB: 1933/07/31  Age: 89 y.o. MRN#: [id number] Attending Physician: Joycelyn Das, MD Primary Care Physician: Lauro Regulus, MD Admit Date: 08/15/2023  Reason for Consultation/Follow-up: Establishing goals of care  Subjective: Notes and labs reviewed.  In to see patient to follow-up.  Currently she is resting in bed with son Casimiro Needle at bedside.  Patient had wanted conversation with only her son Sandra Delgado present.  Patient denies complaint or need at this time.  H POA son Sandra Delgado will be coming to take her home today.  Length of Stay: 3  Current Medications: Scheduled Meds:   aspirin EC  81 mg Oral Daily   atorvastatin  80 mg Oral q1800   carvedilol  3.125 mg Oral BID WC   cephALEXin  500 mg Oral Q6H   clopidogrel  75 mg Oral Daily   doxycycline  100 mg Oral Q12H   enoxaparin (LOVENOX) injection  50 mg Subcutaneous Q24H   losartan  100 mg Oral Daily   pantoprazole  40 mg Oral Daily    Continuous Infusions:   PRN Meds: acetaminophen, ALPRAZolam, loperamide, magnesium hydroxide, morphine injection, nitroGLYCERIN, ondansetron (ZOFRAN) IV, mouth rinse, traZODone  Physical Exam Pulmonary:     Effort: Pulmonary effort is normal.  Neurological:     Mental Status: She is alert.             Vital Signs: BP (!) 140/55   Pulse 70   Temp 98.1 F (36.7 C)   Resp 17   Ht 5\' 1"  (1.549 m)   Wt 109.2 kg   SpO2 97%   BMI 45.49 kg/m  SpO2: SpO2: 97 % O2 Device: O2 Device: Room Air O2 Flow Rate:    Intake/output summary:  Intake/Output Summary (Last 24 hours) at 08/21/2023 1609 Last data filed at 08/21/2023 1431 Gross per 24 hour  Intake 240 ml  Output --  Net 240 ml   LBM: Last BM Date :  08/19/23 Baseline Weight: Weight: 109.2 kg Most recent weight: Weight: 109.2 kg         Patient Active Problem List   Diagnosis Date Noted   Arm laceration, right, initial encounter 08/18/2023   Recurrent falls 08/18/2023   ABLA (acute blood loss anemia)  08/18/2023   AKI (acute kidney injury) (HCC) 08/16/2023   Dizziness 08/16/2023   Dyslipidemia 08/16/2023   GERD without esophagitis 08/16/2023   Peripheral neuropathy 08/16/2023   Essential hypertension 08/16/2023   NSTEMI (non-ST elevated myocardial infarction) (HCC) 10/06/2017   COPD exacerbation (HCC) 02/06/2016   HLD (hyperlipidemia) 02/06/2016   HTN (hypertension) 02/06/2016   GERD (gastroesophageal reflux disease) 02/06/2016    Palliative Care Assessment & Plan   Recommendations/Plan: Patient discharging today.  PMT will sign off  Code Status:    Code Status Orders  (From admission, onward)           Start     Ordered   08/16/23 0356  Full code  Continuous       Question:  By:  Answer:  Consent: discussion documented in EHR   08/16/23 0400           Code Status History     Date Active Date Inactive Code Status Order ID Comments User Context   10/06/2017 1632 10/07/2017 1923 Full Code [id number]  Enid Baas, MD ED   02/06/2016 0451 02/07/2016 1818 Full Code [id number]  Oralia Manis, MD Inpatient       Thank you for allowing the Palliative Medicine Team to assist in the care of this patient.    Morton Stall, NP  Please contact Palliative Medicine Team phone at [phone] for questions and concerns.

## 2023-08-21 NOTE — Progress Notes (Signed)
PHARMACY CONSULT NOTE - ELECTROLYTES  Pharmacy Consult for Electrolyte Monitoring and Replacement   Recent Labs: Height: 5\' 1"  (154.9 cm) Weight: 109.2 kg (240 lb 11.9 oz) IBW/kg (Calculated) : 47.8 Estimated Creatinine Clearance: 44.9 mL/min (by C-G formula based on SCr of 0.99 mg/dL). Potassium (mmol/L)  Date Value  08/21/2023 4.1  11/27/2014 4.0   Magnesium (mg/dL)  Date Value  40/98/1191 1.9  09/06/2013 1.8   Calcium (mg/dL)  Date Value  47/82/9562 9.2   Calcium, Total (mg/dL)  Date Value  13/07/6577 9.1   Albumin (g/dL)  Date Value  46/96/2952 3.8  04/11/2014 3.3 (L)   Phosphorus (mg/dL)  Date Value  84/13/2440 2.5   Sodium (mmol/L)  Date Value  08/21/2023 140  11/27/2014 135 (L)   Corrected Ca: 8.4 mg/dL  Assessment  Sandra Delgado is a 89 y.o. female presenting with NSTEMI. PMH significant for COPD, hypertension, dyslipidemia, IBS, lymphoma, migraine and osteoarthritis . Pharmacy has been consulted to monitor and replace electrolytes.  Diet: Thin fluids Pertinent medications: Losartan  Goal of Therapy: Electrolytes WNL  Plan:  No electrolyte replacement indicated Monitor BMP with AM labs  Thank you for allowing pharmacy to be a part of this patient's care.  Bettey Costa, PharmD Clinical Pharmacist 08/21/2023 7:32 AM

## 2023-08-21 NOTE — TOC Progression Note (Signed)
Transition of Care [facility]) - Progression Note    Patient Details  Name: Sandra Delgado MRN: [id number] Date of Birth: 1933/04/04  Transition of Care [facility]) CM/SW Contact  Marlowe Sax, RN Phone Number: 08/21/2023, 9:13 AM  Clinical Narrative:    Patient set up with [facility] and RW delivered, Son [name] to transport home   Expected Discharge Plan: Home w Home Health Services Barriers to Discharge: Barriers Resolved  Expected Discharge Plan and Services   Discharge Planning Services: CM Consult   Living arrangements for the past 2 months: Single Family Home Expected Discharge Date: 08/17/23               DME Arranged: Dan Humphreys rolling DME Agency: [employer] Date DME Agency Contacted: 08/20/23 Time DME Agency Contacted: 1213 Representative spoke with at DME Agency: Cletis [place] HH Arranged: PT, OT [facility] Agency: [facility] Date [facility] Agency Contacted: 08/20/23 Time HH Agency Contacted: 1213 Representative spoke with at [facility] Agency: Coralee North   Social Determinants of Health (SDOH) Interventions SDOH Screenings   Food Insecurity: No Food Insecurity (04/16/2023)   Received from [facility]  Transportation Needs: No Transportation Needs (04/16/2023)   Received from [facility]  Utilities: Not At Risk (04/16/2023)   Received from [facility]  Financial Resource Strain: Low Risk  (04/16/2023)   Received from [facility]  Tobacco Use: Low Risk  (08/15/2023)    Readmission Risk Interventions     No data to display

## 2023-08-21 NOTE — Care Management Important Message (Signed)
Important Message  Patient Details  Name: [name] MRN: [id number] Date of Birth: Mar 07, 1933   Medicare Important Message Given:  Yes     Olegario Messier A Inioluwa Boulay 08/21/2023, 11:42 AM

## 2023-08-21 NOTE — Progress Notes (Signed)
Physical Therapy Treatment Patient Details Name: Sandra Delgado MRN: [id number] DOB: 1933-07-19 Today's Date: 08/21/2023   History of Present Illness Pt is an 89 y.o. female with medical history significant for COPD, hypertension, dyslipidemia, IBS, lymphoma, migraine and osteoarthritis, who presented to the emergency room with acute onset of dizziness and fall.  MD assessment includes: NSTEMI, elevated troponin, AKI, dizziness with suspected syncope, and RUE laceration/edema.    PT Comments  Pt in recliner, waiting for RN to return to room to continue task- author facilitates AMB to sink for teeth washing, then AMB to BR for toilet voiding. PT AMB to doorway, grabs RW and AMB 250+ft with safe RW use, steady pacing, no LOB, no signs of exertion. Pt assisted back to recliner at EOS. Pt encouraged to AMB without device and minimal use of hands while in room while author can provide assist, this way she gets to work on her dynamic balance and righting strategies. Pt continues to advance mobility quite well.     If plan is discharge home, recommend the following: A little help with walking and/or transfers;A little help with bathing/dressing/bathroom;Assistance with cooking/housework;Direct supervision/assist for medications management;Help with stairs or ramp for entrance;Assist for transportation;Supervision due to cognitive status;Direct supervision/assist for financial management   Can travel by private vehicle        Equipment Recommendations       Recommendations for Other Services       Precautions / Restrictions Precautions Precautions: Fall Restrictions Weight Bearing Restrictions: No     Mobility  Bed Mobility               General bed mobility comments: in chair on arrival    Transfers Overall transfer level: Needs assistance Equipment used: None, Rolling walker (2 wheels) Transfers: Sit to/from Stand Sit to Stand: Supervision                 Ambulation/Gait Ambulation/Gait assistance: Contact guard assist Gait Distance (Feet): 280 Feet Assistive device: Rolling walker (2 wheels) Gait Pattern/deviations: Step-through pattern Gait velocity: 0.70m/s         Stairs             Wheelchair Mobility     Tilt Bed    Modified Rankin (Stroke Patients Only)       Balance                                            Cognition Arousal: Alert Behavior During Therapy: WFL for tasks assessed/performed Overall Cognitive Status: Within Functional Limits for tasks assessed                                          Exercises Other Exercises Other Exercises: standing at cabinet tooth brushing Other Exercises: AMB to BR and toiletting    General Comments        Pertinent Vitals/Pain Pain Assessment Pain Assessment: No/denies pain    Home Living                          Prior Function            PT Goals (current goals can now be found in the care plan section) Acute Rehab PT Goals Patient Stated Goal: To  walk better and to get back to exercising at the senior center PT Goal Formulation: With patient Time For Goal Achievement: 08/31/23 Potential to Achieve Goals: Good Progress towards PT goals: Progressing toward goals    Frequency    Min 1X/week      PT Plan      Co-evaluation              AM-PAC PT "6 Clicks" Mobility   Outcome Measure  Help needed turning from your back to your side while in a flat bed without using bedrails?: A Little Help needed moving from lying on your back to sitting on the side of a flat bed without using bedrails?: A Little Help needed moving to and from a bed to a chair (including a wheelchair)?: A Little Help needed standing up from a chair using your arms (e.g., wheelchair or bedside chair)?: A Little Help needed to walk in hospital room?: A Little Help needed climbing 3-5 steps with a railing? : A Little 6  Click Score: 18    End of Session Equipment Utilized During Treatment: Gait belt Activity Tolerance: Patient tolerated treatment well;No increased pain Patient left: with call bell/phone within reach;in chair;with chair alarm set Nurse Communication: Mobility status PT Visit Diagnosis: Muscle weakness (generalized) (M62.81);Difficulty in walking, not elsewhere classified (R26.2);History of falling (Z91.81)     Time: 1040-1101 PT Time Calculation (min) (ACUTE ONLY): 21 min  Charges:    $Therapeutic Activity: 8-22 mins PT General Charges $$ ACUTE PT VISIT: 1 Visit                    12:21 PM, 08/21/23 Rosamaria Lints, PT, DPT Physical Therapist - [facility]  [phone] (ASCOM)    Castle Lamons C 08/21/2023, 12:19 PM

## 2023-08-21 NOTE — Progress Notes (Signed)
Patient alert and oriented. No c/o pain during shift. Meds given whole. Attended therapy during shift. Able to ambulate with assistance using walker. Good appetite. Continues antibiotics with no adverse reactions. Dressing changed to right forearm laceration. Family at bedside visiting. Possible discharge tomorrow. Family aware. Currently in no distress at this time.

## 2023-08-22 NOTE — Progress Notes (Signed)
Patient discharged with son. Discharge paperwork was given upon departure. Writer explained discharge instructions,upcoming appointments, and medications. Son and patient both verbalized understanding.

## 2023-08-22 NOTE — TOC Transition Note (Signed)
Transition of Care [facility]) - CM/SW Discharge Note   Patient Details  Name: Sandra Delgado MRN: [id number] Date of Birth: 08-18-1933  [facility] of [facility]) CM/SW Contact:  Liliana Cline, LCSW Phone Number: 08/22/2023, 9:21 AM   Clinical Narrative:    Patient has orders to DC home today. CSW notified Coralee North with [facility].    Final next level of care: Home w Home Health Services Barriers to Discharge: Barriers Resolved   Patient Goals and CMS Choice      Discharge Placement                    Name of family member notified: son, [name] Patient and family notified of of transfer: 08/17/23  Discharge Plan and Services Additional resources added to the After Visit Summary for     Discharge Planning Services: CM Consult            DME Arranged: Dan Humphreys rolling DME Agency: [employer] Date DME Agency Contacted: 08/20/23 Time DME Agency Contacted: 1213 Representative spoke with at DME Agency: [facility] HH Arranged: PT, OT [facility] Agency: [facility] Date [facility] Agency Contacted: 08/22/23 Time HH Agency Contacted: 1213 Representative spoke with at [facility] Agency: Coralee North  Social Determinants of Health (SDOH) Interventions SDOH Screenings   Food Insecurity: No Food Insecurity (04/16/2023)   Received from [facility]  Transportation Needs: No Transportation Needs (04/16/2023)   Received from [facility]  Utilities: Not At Risk (04/16/2023)   Received from [facility]  Financial Resource Strain: Low Risk  (04/16/2023)   Received from [facility]  Tobacco Use: Low Risk  (08/15/2023)     Readmission Risk Interventions     No data to display

## 2023-08-22 NOTE — Plan of Care (Signed)
  Problem: Activity: Goal: Risk for activity intolerance will decrease Outcome: Progressing   Problem: Safety: Goal: Ability to remain free from injury will improve Outcome: Progressing   Problem: Skin Integrity: Goal: Risk for impaired skin integrity will decrease Outcome: Progressing   

## 2023-10-27 ENCOUNTER — Ambulatory Visit: Payer: Medicare HMO | Admitting: Physician Assistant

## 2023-12-01 ENCOUNTER — Ambulatory Visit: Payer: Medicare HMO | Admitting: Physician Assistant

## 2024-04-23 ENCOUNTER — Other Ambulatory Visit: Payer: Self-pay

## 2024-04-23 ENCOUNTER — Inpatient Hospital Stay
Admission: EM | Admit: 2024-04-23 | Discharge: 2024-04-26 | DRG: 282 | Disposition: A | Discharge status: Home Health | Attending: Osteopathic Medicine | Admitting: Osteopathic Medicine

## 2024-04-23 ENCOUNTER — Emergency Department

## 2024-04-23 DIAGNOSIS — Z79899 Other long term (current) drug therapy: Secondary | ICD-10-CM

## 2024-04-23 DIAGNOSIS — K219 Gastro-esophageal reflux disease without esophagitis: Secondary | ICD-10-CM | POA: Diagnosis present

## 2024-04-23 DIAGNOSIS — I214 Non-ST elevation (NSTEMI) myocardial infarction: Principal | ICD-10-CM | POA: Diagnosis present

## 2024-04-23 DIAGNOSIS — H9190 Unspecified hearing loss, unspecified ear: Secondary | ICD-10-CM | POA: Diagnosis present

## 2024-04-23 DIAGNOSIS — J449 Chronic obstructive pulmonary disease, unspecified: Secondary | ICD-10-CM | POA: Diagnosis present

## 2024-04-23 DIAGNOSIS — M1990 Unspecified osteoarthritis, unspecified site: Secondary | ICD-10-CM | POA: Diagnosis present

## 2024-04-23 DIAGNOSIS — E039 Hypothyroidism, unspecified: Secondary | ICD-10-CM | POA: Diagnosis present

## 2024-04-23 DIAGNOSIS — Z9049 Acquired absence of other specified parts of digestive tract: Secondary | ICD-10-CM

## 2024-04-23 DIAGNOSIS — Z7982 Long term (current) use of aspirin: Secondary | ICD-10-CM

## 2024-04-23 DIAGNOSIS — D649 Anemia, unspecified: Secondary | ICD-10-CM | POA: Diagnosis present

## 2024-04-23 DIAGNOSIS — Z8572 Personal history of non-Hodgkin lymphomas: Secondary | ICD-10-CM

## 2024-04-23 DIAGNOSIS — R260 Ataxic gait: Secondary | ICD-10-CM | POA: Diagnosis present

## 2024-04-23 DIAGNOSIS — Z7902 Long term (current) use of antithrombotics/antiplatelets: Secondary | ICD-10-CM

## 2024-04-23 DIAGNOSIS — Z602 Problems related to living alone: Secondary | ICD-10-CM | POA: Diagnosis present

## 2024-04-23 DIAGNOSIS — K589 Irritable bowel syndrome without diarrhea: Secondary | ICD-10-CM | POA: Diagnosis present

## 2024-04-23 DIAGNOSIS — R0789 Other chest pain: Secondary | ICD-10-CM

## 2024-04-23 DIAGNOSIS — N1831 Chronic kidney disease, stage 3a: Secondary | ICD-10-CM | POA: Diagnosis present

## 2024-04-23 DIAGNOSIS — Z66 Do not resuscitate: Secondary | ICD-10-CM | POA: Diagnosis present

## 2024-04-23 DIAGNOSIS — R42 Dizziness and giddiness: Secondary | ICD-10-CM

## 2024-04-23 DIAGNOSIS — Z800 Family history of malignant neoplasm of digestive organs: Secondary | ICD-10-CM

## 2024-04-23 DIAGNOSIS — E785 Hyperlipidemia, unspecified: Secondary | ICD-10-CM | POA: Diagnosis present

## 2024-04-23 DIAGNOSIS — E860 Dehydration: Secondary | ICD-10-CM | POA: Diagnosis present

## 2024-04-23 DIAGNOSIS — Z888 Allergy status to other drugs, medicaments and biological substances status: Secondary | ICD-10-CM

## 2024-04-23 DIAGNOSIS — I129 Hypertensive chronic kidney disease with stage 1 through stage 4 chronic kidney disease, or unspecified chronic kidney disease: Secondary | ICD-10-CM | POA: Diagnosis present

## 2024-04-23 DIAGNOSIS — G629 Polyneuropathy, unspecified: Secondary | ICD-10-CM | POA: Diagnosis present

## 2024-04-23 DIAGNOSIS — Z90710 Acquired absence of both cervix and uterus: Secondary | ICD-10-CM

## 2024-04-23 DIAGNOSIS — G43909 Migraine, unspecified, not intractable, without status migrainosus: Secondary | ICD-10-CM | POA: Diagnosis present

## 2024-04-23 DIAGNOSIS — I10 Essential (primary) hypertension: Secondary | ICD-10-CM | POA: Diagnosis present

## 2024-04-23 DIAGNOSIS — Z880 Allergy status to penicillin: Secondary | ICD-10-CM

## 2024-04-23 DIAGNOSIS — I447 Left bundle-branch block, unspecified: Secondary | ICD-10-CM | POA: Diagnosis present

## 2024-04-23 DIAGNOSIS — I2510 Atherosclerotic heart disease of native coronary artery without angina pectoris: Secondary | ICD-10-CM | POA: Diagnosis present

## 2024-04-23 DIAGNOSIS — Z9889 Other specified postprocedural states: Secondary | ICD-10-CM

## 2024-04-23 LAB — CBC
HCT: 33.7 % — ABNORMAL LOW (ref 36.0–46.0)
Hemoglobin: 11.4 g/dL — ABNORMAL LOW (ref 12.0–15.0)
MCH: 32.5 pg (ref 26.0–34.0)
MCHC: 33.8 g/dL (ref 30.0–36.0)
MCV: 96 fL (ref 80.0–100.0)
Platelets: 217 10*3/uL (ref 150–400)
RBC: 3.51 MIL/uL — ABNORMAL LOW (ref 3.87–5.11)
RDW: 12.3 % (ref 11.5–15.5)
WBC: 8.1 10*3/uL (ref 4.0–10.5)
nRBC: 0 % (ref 0.0–0.2)

## 2024-04-23 MED ORDER — SODIUM CHLORIDE 0.9 % IV BOLUS
1000.0000 mL | Freq: Once | INTRAVENOUS | Status: AC
  Administered 2024-04-24: 1000 mL via INTRAVENOUS

## 2024-04-23 NOTE — ED Provider Notes (Signed)
 [facility] Provider Note    Event Date/Time   First MD Initiated Contact with Patient 04/23/24 2307     (approximate)   History   Dizziness   HPI  Sandra Delgado is a 89 y.o. female   Past medical history of hypertension hyperlipidemia, COPD presents with dizziness.  Acute onset around 9 PM today.  She was in her normal state of health and as she got up to use the bathroom she felt off balance.  She had to hold onto things to get the seated position.  She continued to feel mild dizziness at rest, and a slight burning sensation in her chest.  She did not fall or sustain any injuries.  She denies any recent illnesses, respiratory infectious symptoms, GU symptoms.  Her daughter is at bedside states that her mother lives alone, is hard of hearing, and seems a little bit more confused than she typically is.  Independent Historian contributed to assessment above: Daughter gives collateral information as above  External Medical Documents Reviewed: Hospitalization notes from last year when she was diagnosed with NSTEMI with chief complaint of dizziness      Physical Exam   Triage Vital Signs: ED Triage Vitals  Encounter Vitals Group     BP 04/23/24 2313 (!) 155/83     Systolic BP Percentile --      Diastolic BP Percentile --      Pulse Rate 04/23/24 2306 66     Resp 04/23/24 2306 20     Temp 04/23/24 2306 98.3 F (36.8 C)     Temp Source 04/23/24 2306 Oral     SpO2 04/23/24 2306 98 %     Weight --      Height --      Head Circumference --      Peak Flow --      Pain Score 04/23/24 2313 9     Pain Loc --      Pain Education --      Exclude from Growth Chart --     Most recent vital signs: Vitals:   04/23/24 2330 04/24/24 0000  BP: (!) 144/71 (!) 155/73  Pulse: 62 63  Resp: 14 (!) 22  Temp:    SpO2: 98% 97%    General: Awake, no distress.  CV:  Good peripheral perfusion.  Resp:  Normal effort.  Abd:  No distention.   Other:  Pleasant woman in no acute distress.  Hypertensive otherwise vital signs are normal.  Heart and lung sounds are normal she has a benign abdominal exam.  Her mucous membranes are slightly dry.  She has palpable radial pulses bilaterally, equal.  Neurologic exam is significant for ataxia as show when she stands she begins to rock back-and-forth.  She typically walks with a walker.  Negative dysarthria or facial symmetry and motor function is intact throughout.  No overt nystagmus.  She does walk full when asked her about sensation that she is sometimes her legs and feet feel numb when I palpate at times she rejects this statement.  Thank you she has no other focal neurologic deficits   ED Results / Procedures / Treatments   Labs (all labs ordered are listed, but only abnormal results are displayed) Labs Reviewed  BASIC METABOLIC PANEL WITH GFR - Abnormal; Notable for the following components:      Result Value   Glucose, Bld 111 (*)    BUN 33 (*)    Creatinine, Ser 1.27 (*)  Calcium  8.7 (*)    GFR, Estimated 41 (*)    All other components within normal limits  CBC - Abnormal; Notable for the following components:   RBC 3.51 (*)    Hemoglobin 11.4 (*)    HCT 33.7 (*)    All other components within normal limits  COMPREHENSIVE METABOLIC PANEL WITH GFR - Abnormal; Notable for the following components:   CO2 21 (*)    Glucose, Bld 103 (*)    BUN 31 (*)    Creatinine, Ser 1.22 (*)    Calcium  8.6 (*)    Total Protein 6.2 (*)    Albumin 3.4 (*)    Alkaline Phosphatase 35 (*)    Total Bilirubin 1.5 (*)    GFR, Estimated 43 (*)    All other components within normal limits  URINALYSIS, W/ REFLEX TO CULTURE (INFECTION SUSPECTED) - Abnormal; Notable for the following components:   Color, Urine STRAW (*)    APPearance CLEAR (*)    Hgb urine dipstick SMALL (*)    Leukocytes,Ua MODERATE (*)    All other components within normal limits  CBC WITH DIFFERENTIAL/PLATELET - Abnormal;  Notable for the following components:   RBC 3.49 (*)    Hemoglobin 11.5 (*)    HCT 33.6 (*)    All other components within normal limits  TROPONIN I (HIGH SENSITIVITY) - Abnormal; Notable for the following components:   Troponin I (High Sensitivity) 27 (*)    All other components within normal limits  TROPONIN I (HIGH SENSITIVITY) - Abnormal; Notable for the following components:   Troponin I (High Sensitivity) 113 (*)    All other components within normal limits  URINE DRUG SCREEN, QUALITATIVE ([facility] ONLY)  PROTIME-INR  APTT  ETHANOL  D-DIMER, QUANTITATIVE      EKG  ED ECG REPORT I, Buell Carmin, the attending physician, personally viewed and interpreted this ECG.   Date: 04/23/2024  EKG Time: 2329  Rate: 67  Rhythm: sinus  Axis: nl  Intervals:lbbb  ST&T Change: no stemi   PROCEDURES:  Critical Care performed: Yes, see critical care procedure note(s)  .Critical Care  Performed by: Buell Carmin, MD Authorized by: Buell Carmin, MD   Critical care provider statement:    Critical care time (minutes):  30   Critical care was time spent personally by me on the following activities:  Development of treatment plan with patient or surrogate, discussions with consultants, evaluation of patient's response to treatment, examination of patient, ordering and review of laboratory studies, ordering and review of radiographic studies, ordering and performing treatments and interventions, pulse oximetry, re-evaluation of patient's condition and review of old charts    MEDICATIONS ORDERED IN ED: Medications  aspirin  chewable tablet 324 mg (has no administration in time range)  iohexol  (OMNIPAQUE ) 350 MG/ML injection 75 mL (has no administration in time range)  sodium chloride  0.9 % bolus 1,000 mL (1,000 mLs Intravenous New Bag/Given 04/24/24 0132)     IMPRESSION / MDM / ASSESSMENT AND PLAN / ED COURSE  I reviewed the triage vital signs and the nursing notes.                                 Patient's presentation is most consistent with acute presentation with potential threat to life or bodily function.  Differential diagnosis includes, but is not limited to, stroke, ACS, dehydration electrolyte derangements, infection   The patient is on the cardiac  monitor to evaluate for evidence of arrhythmia and/or significant heart rate changes.  MDM:    Her last known normal was 9 PM and she has chief complaint of dizziness, ataxic gait on examination, within the stroke window so I have called the stroke code.  Other neurologic deficits are hard to ascertain as she appears at least very slightly confused and daughter certainly feels that her mother is not in her normal mental state, and she does at times describe lower extremity sensory deficits as well.  She looks a little bit dehydrated I will give her some fluids.  Check labs including kidney function, electrolytes.  She has burning sensation in her chest I am concerned for ACS as well.  EKG looks nonischemic.  Will need to check troponins especially in light of last time she was diagnosed with NSTEMI in the setting of dizziness.  I considered PE but her symptoms do not quite match, so I proceeded with a D-dimer which is negative.  Check for infection as well, check UA, check chest x-ray, as daughter states that sometimes when she is confused she is diagnosed with urine infection.  --- Neurology note reviewed, by the time of their evaluation apparently symptoms are completely resolved from a neurologic standpoint.  They recommend TIA workup with CT angiogram of the head and neck/MRI brain for which I have ordered.  Her troponins have come back elevated from 20s up over 100 now.  I think in light of her chest heaviness ACS is much higher on the differential than stroke.  I think it is important to treat her NSTEMI with aspirin /heparin  and initiate treatment now rather than wait for the results of the MRI brain as I think that the  likelihood of ischemic brain injury is low, and the possibility of hemorrhagic conversion for any possible ischemic insult is outweighed by the benefits of starting heparin  for her NSTEMI now.       FINAL CLINICAL IMPRESSION(S) / ED DIAGNOSES   Final diagnoses:  Burning chest pain  Dizziness  NSTEMI (non-ST elevated myocardial infarction) (HCC)     Rx / DC Orders   ED Discharge Orders     None        Note:  This document was prepared using Dragon voice recognition software and may include unintentional dictation errors.    Buell Carmin, MD 04/23/24 8119    Buell Carmin, MD 04/24/24 1478    Buell Carmin, MD 04/24/24 [phone]

## 2024-04-23 NOTE — ED Notes (Signed)
 Telespecialist  notified of code stroke

## 2024-04-23 NOTE — ED Notes (Signed)
 Code Stroke called to [facility]  spoke with Kianna

## 2024-04-23 NOTE — ED Triage Notes (Addendum)
 Pt called ems after getting dizzy after she got up tonight. Per EMS pt has been c/o weakness and confusion./ Last time she was dx with UTI. HR from 56-126 and cbg 202. Pt lives at home independently but son and her daughter-in-law checks on her.

## 2024-04-24 ENCOUNTER — Observation Stay

## 2024-04-24 ENCOUNTER — Emergency Department

## 2024-04-24 ENCOUNTER — Observation Stay
Admit: 2024-04-24 | Discharge: 2024-04-24 | Disposition: A | Discharge status: Home / Self Care | Attending: Family Medicine

## 2024-04-24 DIAGNOSIS — Z602 Problems related to living alone: Secondary | ICD-10-CM | POA: Diagnosis present

## 2024-04-24 DIAGNOSIS — Z66 Do not resuscitate: Secondary | ICD-10-CM | POA: Diagnosis present

## 2024-04-24 DIAGNOSIS — E860 Dehydration: Secondary | ICD-10-CM | POA: Diagnosis present

## 2024-04-24 DIAGNOSIS — R260 Ataxic gait: Secondary | ICD-10-CM | POA: Diagnosis present

## 2024-04-24 DIAGNOSIS — E785 Hyperlipidemia, unspecified: Secondary | ICD-10-CM

## 2024-04-24 DIAGNOSIS — Z79899 Other long term (current) drug therapy: Secondary | ICD-10-CM | POA: Diagnosis not present

## 2024-04-24 DIAGNOSIS — I214 Non-ST elevation (NSTEMI) myocardial infarction: Secondary | ICD-10-CM

## 2024-04-24 DIAGNOSIS — I2510 Atherosclerotic heart disease of native coronary artery without angina pectoris: Secondary | ICD-10-CM | POA: Diagnosis present

## 2024-04-24 DIAGNOSIS — R42 Dizziness and giddiness: Secondary | ICD-10-CM

## 2024-04-24 DIAGNOSIS — G43909 Migraine, unspecified, not intractable, without status migrainosus: Secondary | ICD-10-CM | POA: Diagnosis present

## 2024-04-24 DIAGNOSIS — K219 Gastro-esophageal reflux disease without esophagitis: Secondary | ICD-10-CM | POA: Diagnosis present

## 2024-04-24 DIAGNOSIS — K589 Irritable bowel syndrome without diarrhea: Secondary | ICD-10-CM | POA: Diagnosis present

## 2024-04-24 DIAGNOSIS — I447 Left bundle-branch block, unspecified: Secondary | ICD-10-CM | POA: Diagnosis present

## 2024-04-24 DIAGNOSIS — E039 Hypothyroidism, unspecified: Secondary | ICD-10-CM | POA: Diagnosis present

## 2024-04-24 DIAGNOSIS — Z7902 Long term (current) use of antithrombotics/antiplatelets: Secondary | ICD-10-CM | POA: Diagnosis not present

## 2024-04-24 DIAGNOSIS — Z800 Family history of malignant neoplasm of digestive organs: Secondary | ICD-10-CM | POA: Diagnosis not present

## 2024-04-24 DIAGNOSIS — M1990 Unspecified osteoarthritis, unspecified site: Secondary | ICD-10-CM | POA: Diagnosis present

## 2024-04-24 DIAGNOSIS — D649 Anemia, unspecified: Secondary | ICD-10-CM | POA: Diagnosis present

## 2024-04-24 DIAGNOSIS — J449 Chronic obstructive pulmonary disease, unspecified: Secondary | ICD-10-CM | POA: Diagnosis present

## 2024-04-24 DIAGNOSIS — Z7982 Long term (current) use of aspirin: Secondary | ICD-10-CM | POA: Diagnosis not present

## 2024-04-24 DIAGNOSIS — Z9049 Acquired absence of other specified parts of digestive tract: Secondary | ICD-10-CM | POA: Diagnosis not present

## 2024-04-24 DIAGNOSIS — I10 Essential (primary) hypertension: Secondary | ICD-10-CM

## 2024-04-24 DIAGNOSIS — N1831 Chronic kidney disease, stage 3a: Secondary | ICD-10-CM | POA: Diagnosis present

## 2024-04-24 DIAGNOSIS — H9190 Unspecified hearing loss, unspecified ear: Secondary | ICD-10-CM | POA: Diagnosis present

## 2024-04-24 DIAGNOSIS — I129 Hypertensive chronic kidney disease with stage 1 through stage 4 chronic kidney disease, or unspecified chronic kidney disease: Secondary | ICD-10-CM | POA: Diagnosis present

## 2024-04-24 DIAGNOSIS — G629 Polyneuropathy, unspecified: Secondary | ICD-10-CM | POA: Diagnosis present

## 2024-04-24 LAB — COMPREHENSIVE METABOLIC PANEL WITH GFR
ALT: 9 U/L (ref 0–44)
AST: 27 U/L (ref 15–41)
Albumin: 3.4 g/dL — ABNORMAL LOW (ref 3.5–5.0)
Alkaline Phosphatase: 35 U/L — ABNORMAL LOW (ref 38–126)
Anion gap: 8 (ref 5–15)
BUN: 31 mg/dL — ABNORMAL HIGH (ref 8–23)
CO2: 21 mmol/L — ABNORMAL LOW (ref 22–32)
Calcium: 8.6 mg/dL — ABNORMAL LOW (ref 8.9–10.3)
Chloride: 111 mmol/L (ref 98–111)
Creatinine, Ser: 1.22 mg/dL — ABNORMAL HIGH (ref 0.44–1.00)
GFR, Estimated: 43 mL/min — ABNORMAL LOW (ref >60)
Glucose, Bld: 103 mg/dL — ABNORMAL HIGH (ref 70–99)
Potassium: 4.8 mmol/L (ref 3.5–5.1)
Sodium: 140 mmol/L (ref 135–145)
Total Bilirubin: 1.5 mg/dL — ABNORMAL HIGH (ref 0.0–1.2)
Total Protein: 6.2 g/dL — ABNORMAL LOW (ref 6.5–8.1)

## 2024-04-24 LAB — CBC WITH DIFFERENTIAL/PLATELET
Abs Immature Granulocytes: 0.01 10*3/uL (ref 0.00–0.07)
Basophils Absolute: 0.1 10*3/uL (ref 0.0–0.1)
Basophils Relative: 1 %
Eosinophils Absolute: 0.2 10*3/uL (ref 0.0–0.5)
Eosinophils Relative: 2 %
HCT: 33.6 % — ABNORMAL LOW (ref 36.0–46.0)
Hemoglobin: 11.5 g/dL — ABNORMAL LOW (ref 12.0–15.0)
Immature Granulocytes: 0 %
Lymphocytes Relative: 29 %
Lymphs Abs: 2.4 10*3/uL (ref 0.7–4.0)
MCH: 33 pg (ref 26.0–34.0)
MCHC: 34.2 g/dL (ref 30.0–36.0)
MCV: 96.3 fL (ref 80.0–100.0)
Monocytes Absolute: 0.6 10*3/uL (ref 0.1–1.0)
Monocytes Relative: 7 %
Neutro Abs: 5 10*3/uL (ref 1.7–7.7)
Neutrophils Relative %: 61 %
Platelets: 218 10*3/uL (ref 150–400)
RBC: 3.49 MIL/uL — ABNORMAL LOW (ref 3.87–5.11)
RDW: 12.3 % (ref 11.5–15.5)
WBC: 8.1 10*3/uL (ref 4.0–10.5)
nRBC: 0 % (ref 0.0–0.2)

## 2024-04-24 LAB — URINALYSIS, W/ REFLEX TO CULTURE (INFECTION SUSPECTED)
Bacteria, UA: NONE SEEN
Bilirubin Urine: NEGATIVE
Glucose, UA: NEGATIVE mg/dL
Ketones, ur: NEGATIVE mg/dL
Nitrite: NEGATIVE
Protein, ur: NEGATIVE mg/dL
Specific Gravity, Urine: 1.009 (ref 1.005–1.030)
pH: 5 (ref 5.0–8.0)

## 2024-04-24 LAB — URINE DRUG SCREEN, QUALITATIVE (ARMC ONLY)
Amphetamines, Ur Screen: NOT DETECTED
Barbiturates, Ur Screen: NOT DETECTED
Benzodiazepine, Ur Scrn: NOT DETECTED
Cannabinoid 50 Ng, Ur ~~LOC~~: NOT DETECTED
Cocaine Metabolite,Ur ~~LOC~~: NOT DETECTED
MDMA (Ecstasy)Ur Screen: NOT DETECTED
Methadone Scn, Ur: NOT DETECTED
Opiate, Ur Screen: NOT DETECTED
Phencyclidine (PCP) Ur S: NOT DETECTED
Tricyclic, Ur Screen: NOT DETECTED

## 2024-04-24 LAB — BASIC METABOLIC PANEL WITH GFR
Anion gap: 8 (ref 5–15)
BUN: 33 mg/dL — ABNORMAL HIGH (ref 8–23)
CO2: 22 mmol/L (ref 22–32)
Calcium: 8.7 mg/dL — ABNORMAL LOW (ref 8.9–10.3)
Chloride: 109 mmol/L (ref 98–111)
Creatinine, Ser: 1.27 mg/dL — ABNORMAL HIGH (ref 0.44–1.00)
GFR, Estimated: 41 mL/min — ABNORMAL LOW (ref >60)
Glucose, Bld: 111 mg/dL — ABNORMAL HIGH (ref 70–99)
Potassium: 4.2 mmol/L (ref 3.5–5.1)
Sodium: 139 mmol/L (ref 135–145)

## 2024-04-24 LAB — TROPONIN I (HIGH SENSITIVITY)
Troponin I (High Sensitivity): 27 ng/L — ABNORMAL HIGH (ref <18)
Troponin I (High Sensitivity): 113 ng/L (ref <18)
Troponin I (High Sensitivity): 395 ng/L (ref <18)
Troponin I (High Sensitivity): 389 ng/L (ref <18)

## 2024-04-24 LAB — HEPARIN LEVEL (UNFRACTIONATED): Heparin Unfractionated: 0.85 [IU]/mL — ABNORMAL HIGH (ref 0.30–0.70)

## 2024-04-24 LAB — ECHOCARDIOGRAM COMPLETE BUBBLE STUDY
AR max vel: 1.93 cm2
AV Peak grad: 6.6 mmHg
Ao pk vel: 1.28 m/s
Area-P 1/2: 3.12 cm2
S' Lateral: 2.6 cm

## 2024-04-24 LAB — PROTIME-INR
INR: 1.1 (ref 0.8–1.2)
Prothrombin Time: 14.4 s (ref 11.4–15.2)

## 2024-04-24 LAB — ETHANOL: Alcohol, Ethyl (B): <15 mg/dL (ref <15)

## 2024-04-24 LAB — HEMOGLOBIN A1C
Hgb A1c MFr Bld: 4.9 % (ref 4.8–5.6)
Mean Plasma Glucose: 93.93 mg/dL

## 2024-04-24 LAB — APTT: aPTT: 25 s (ref 24–36)

## 2024-04-24 LAB — D-DIMER, QUANTITATIVE: D-Dimer, Quant: <0.27 ug{FEU}/mL (ref 0.00–0.50)

## 2024-04-24 MED ORDER — ROSUVASTATIN CALCIUM 10 MG PO TABS
20.0000 mg | ORAL_TABLET | Freq: Every day | ORAL | Status: DC
  Administered 2024-04-24 – 2024-04-26 (×3): 20 mg via ORAL
  Filled 2024-04-24: qty 1
  Filled 2024-04-24: qty 2
  Filled 2024-04-24: qty 1
  Filled 2024-04-24: qty 2

## 2024-04-24 MED ORDER — NITROGLYCERIN 0.4 MG SL SUBL: 0.4000 mg | SUBLINGUAL_TABLET | SUBLINGUAL | Status: DC | PRN

## 2024-04-24 MED ORDER — ACETAMINOPHEN 650 MG RE SUPP: 650.0000 mg | Freq: Four times a day (QID) | RECTAL | Status: DC | PRN

## 2024-04-24 MED ORDER — ONDANSETRON HCL 4 MG/2ML IJ SOLN
4.0000 mg | Freq: Four times a day (QID) | INTRAMUSCULAR | Status: DC | PRN
Start: 2024-04-24 — End: 2024-04-26

## 2024-04-24 MED ORDER — ASPIRIN 81 MG PO CHEW
324.0000 mg | CHEWABLE_TABLET | Freq: Once | ORAL | Status: AC
  Administered 2024-04-24: 324 mg via ORAL
  Filled 2024-04-24: qty 4

## 2024-04-24 MED ORDER — ALUM & MAG HYDROXIDE-SIMETH 200-200-20 MG/5ML PO SUSP
30.0000 mL | Freq: Once | ORAL | Status: DC
  Filled 2024-04-24: qty 30

## 2024-04-24 MED ORDER — MECLIZINE HCL 25 MG PO TABS: 12.5000 mg | ORAL_TABLET | Freq: Three times a day (TID) | ORAL | Status: DC | PRN

## 2024-04-24 MED ORDER — SODIUM CHLORIDE 0.9 % IV SOLN: INTRAVENOUS | Status: DC

## 2024-04-24 MED ORDER — ASPIRIN 325 MG PO TBEC
325.0000 mg | DELAYED_RELEASE_TABLET | Freq: Every day | ORAL | Status: DC
  Administered 2024-04-25: 325 mg via ORAL
  Filled 2024-04-24: qty 1

## 2024-04-24 MED ORDER — MORPHINE SULFATE (PF) 2 MG/ML IV SOLN
2.0000 mg | INTRAVENOUS | Status: DC | PRN
Start: 2024-04-24 — End: 2024-04-24

## 2024-04-24 MED ORDER — ASPIRIN 325 MG PO TBEC: 325.0000 mg | DELAYED_RELEASE_TABLET | Freq: Every day | ORAL | Status: DC

## 2024-04-24 MED ORDER — IOHEXOL 350 MG/ML SOLN
75.0000 mL | Freq: Once | INTRAVENOUS | Status: AC | PRN
  Administered 2024-04-24: 75 mL via INTRAVENOUS

## 2024-04-24 MED ORDER — TRAZODONE HCL 50 MG PO TABS
25.0000 mg | ORAL_TABLET | Freq: Every evening | ORAL | Status: DC | PRN
  Filled 2024-04-24: qty 1

## 2024-04-24 MED ORDER — ACETAMINOPHEN 325 MG PO TABS
650.0000 mg | ORAL_TABLET | Freq: Four times a day (QID) | ORAL | Status: DC | PRN
  Administered 2024-04-24: 650 mg via ORAL
  Filled 2024-04-24: qty 2

## 2024-04-24 MED ORDER — MORPHINE SULFATE (PF) 2 MG/ML IV SOLN: 2.0000 mg | INTRAVENOUS | Status: DC | PRN

## 2024-04-24 MED ORDER — SODIUM CHLORIDE 0.9% FLUSH
3.0000 mL | Freq: Two times a day (BID) | INTRAVENOUS | Status: DC
  Administered 2024-04-25 – 2024-04-26 (×2): 3 mL via INTRAVENOUS

## 2024-04-24 MED ORDER — HEPARIN (PORCINE) 25000 UT/250ML-% IV SOLN
600.0000 [IU]/h | INTRAVENOUS | Status: DC
  Administered 2024-04-24: 700 [IU]/h via INTRAVENOUS
  Administered 2024-04-25: 600 [IU]/h via INTRAVENOUS
  Filled 2024-04-24 (×2): qty 250

## 2024-04-24 MED ORDER — STROKE: EARLY STAGES OF RECOVERY BOOK: Freq: Once | Status: DC

## 2024-04-24 MED ORDER — MAGNESIUM HYDROXIDE 400 MG/5ML PO SUSP: 30.0000 mL | Freq: Every day | ORAL | Status: DC | PRN

## 2024-04-24 MED ORDER — DICYCLOMINE HCL 10 MG/5ML PO SOLN
10.0000 mg | Freq: Once | ORAL | Status: DC
  Filled 2024-04-24: qty 5

## 2024-04-24 MED ORDER — HYOSCYAMINE SULFATE 0.125 MG SL SUBL
0.2500 mg | SUBLINGUAL_TABLET | Freq: Once | SUBLINGUAL | Status: DC
  Filled 2024-04-24: qty 2

## 2024-04-24 MED ORDER — ONDANSETRON HCL 4 MG PO TABS: 4.0000 mg | ORAL_TABLET | Freq: Four times a day (QID) | ORAL | Status: DC | PRN

## 2024-04-24 MED ORDER — HEPARIN BOLUS VIA INFUSION
3100.0000 [IU] | Freq: Once | INTRAVENOUS | Status: AC
  Administered 2024-04-24: 3100 [IU] via INTRAVENOUS
  Filled 2024-04-24: qty 3100

## 2024-04-24 NOTE — Consult Note (Signed)
 [facility] Cardiology  CARDIOLOGY CONSULT NOTE  Patient ID: Sandra Delgado MRN: [id number] DOB/AGE: 88-Apr-1934 88 y.o.  Admit date: 04/23/2024 Referring Physician [facility] Primary Physician [facility] Primary Cardiologist [place] Reason for Consultation NSTEMI  HPI: 89 year old female referred for evaluation of chest pain.  Patient presents to [facility] ED 04/24/2024 with chief complaint of chest pain, rated 8 out of 10.  ECG revealed left bundle branch block.  Admission labs notable for elevated troponin 27, 113.  Patient complained of vertigo, head CT and brain MRI were negative.  Patient was started on heparin  drip with resolution of chest pain.  Patient has history of cardiac catheterization 10/07/2017 which revealed 60% stenosis proximal RCA.  Admission labs notable for chronic kidney disease stage III with BUN and creatinine 33 and 1.27, respectively.  Review of systems complete and found to be negative unless listed above     Past Medical History:  Diagnosis Date   Arthritis    CKD (chronic kidney disease) stage 3, GFR 30-59 ml/min (HCC)    COPD (chronic obstructive pulmonary disease) (HCC)    HLD (hyperlipidemia)    HTN (hypertension)    IBS (irritable bowel syndrome)    Lymphoma (HCC)    Migraine     Past Surgical History:  Procedure Laterality Date   ABDOMINAL HYSTERECTOMY     APPENDECTOMY     COLONOSCOPY     LEFT HEART CATH AND CORONARY ANGIOGRAPHY N/A 10/07/2017   Procedure: LEFT HEART CATH AND CORONARY ANGIOGRAPHY;  Surgeon: Ronney Cola, MD;  Location: [facility] INVASIVE CV LAB;  Service: Cardiovascular;  Laterality: N/A;    (Not in a hospital admission)  Social History   Socioeconomic History   Marital status: Widowed    Spouse name: Not on file   Number of children: Not on file   Years of education: Not on file   Highest education level: Not on file  Occupational History   Not on file  Tobacco Use   Smoking status: Never   Smokeless tobacco: Never  Substance and Sexual  Activity   Alcohol use: Yes    Alcohol/week: 0.0 standard drinks of alcohol    Comment: occassional   Drug use: No   Sexual activity: Not on file  Other Topics Concern   Not on file  Social History Narrative   Lives at home by herself. Independent at baseline.   Social Drivers of Corporate investment banker Strain: Low Risk  (04/20/2024)   Received from [facility]   Overall Financial Resource Strain (CARDIA)    Difficulty of Paying Living Expenses: Not hard at all  Food Insecurity: No Food Insecurity (04/20/2024)   Received from [facility]   Hunger Vital Sign    Ran Out of Food in the Last Year: Never true    Worried About Running Out of Food in the Last Year: Never true  Transportation Needs: No Transportation Needs (04/20/2024)   Received from [facility]   PRAPARE - Transportation    Lack of Transportation (Non-Medical): No    In the past 12 months, has lack of transportation kept you from medical appointments or from getting medications?: No  Physical Activity: Not on file  Stress: Not on file  Social Connections: Not on file  Intimate Partner Violence: Unknown (08/17/2023)   Humiliation, Afraid, Rape, and Kick questionnaire    Fear of Current or Ex-Partner: No    Emotionally Abused: No    Physically Abused: Not on file  Sexually Abused: No    Family History  Problem Relation Age of Onset   Colon cancer Mother       Review of systems complete and found to be negative unless listed above      PHYSICAL EXAM  General: Well developed, well nourished, in no acute distress HEENT:  Normocephalic and atramatic Neck:  No JVD.  Lungs: Clear bilaterally to auscultation and percussion. Heart: HRRR . Normal S1 and S2 without gallops or murmurs.  Abdomen: Bowel sounds are positive, abdomen soft and non-tender  Msk:  Back normal, normal gait. Normal strength and tone for age. Extremities: No clubbing, cyanosis or  edema.   Neuro: Alert and oriented X 3. Psych:  Good affect, responds appropriately  Labs:   Lab Results  Component Value Date   WBC 8.1 04/23/2024   WBC 8.1 04/23/2024   HGB 11.4 (L) 04/23/2024   HGB 11.5 (L) 04/23/2024   HCT 33.7 (L) 04/23/2024   HCT 33.6 (L) 04/23/2024   MCV 96.0 04/23/2024   MCV 96.3 04/23/2024   PLT 217 04/23/2024   PLT 218 04/23/2024    Recent Labs  Lab 04/23/24 0131 04/23/24 2318  NA 140 139  K 4.8 4.2  CL 111 109  CO2 21* 22  BUN 31* 33*  CREATININE 1.22* 1.27*  CALCIUM  8.6* 8.7*  PROT 6.2*  --   BILITOT 1.5*  --   ALKPHOS 35*  --   ALT 9  --   AST 27  --   GLUCOSE 103* 111*   Lab Results  Component Value Date   TROPONINI 1.43 (HH) 10/07/2017    Lab Results  Component Value Date   CHOL 141 08/16/2023   CHOL 160 10/06/2017   Lab Results  Component Value Date   HDL 55 08/16/2023   HDL 59 10/06/2017   Lab Results  Component Value Date   LDLCALC 67 08/16/2023   LDLCALC 73 10/06/2017   Lab Results  Component Value Date   TRIG 96 08/16/2023   TRIG 142 10/06/2017   Lab Results  Component Value Date   CHOLHDL 2.6 08/16/2023   CHOLHDL 2.7 10/06/2017   No results found for: "LDLDIRECT"    Radiology: MR BRAIN WO CONTRAST Result Date: 04/24/2024 CLINICAL DATA:  Transient ischemic attack (TIA). EXAM: MRI HEAD WITHOUT CONTRAST TECHNIQUE: Multiplanar, multiecho pulse sequences of the brain and surrounding structures were obtained without intravenous contrast. COMPARISON:  CT head Apr 23, 2024. FINDINGS: Brain: No acute infarction, hemorrhage, hydrocephalus, extra-axial collection or mass lesion. Mild for age scattered T2/FLAIR hyperintensities in the white matter, compatible with chronic microvascular ischemic change. Vascular: Major arterial flow voids are maintained at the skull base. Skull and upper cervical spine: Normal marrow signal. Sinuses/Orbits: Clear sinuses.  No acute orbital findings. Other: No mastoid effusions. IMPRESSION: No  acute abnormality. Electronically Signed   By: Stevenson Elbe M.D.   On: 04/24/2024 03:56   CT Angio Head Neck W WO CM Result Date: 04/24/2024 CLINICAL DATA:  Transient ischemic attack (TIA) EXAM: CT ANGIOGRAPHY HEAD AND NECK WITH AND WITHOUT CONTRAST TECHNIQUE: Multidetector CT imaging of the head and neck was performed using the standard protocol during bolus administration of intravenous contrast. Multiplanar CT image reconstructions and MIPs were obtained to evaluate the vascular anatomy. Carotid stenosis measurements (when applicable) are obtained utilizing NASCET criteria, using the distal internal carotid diameter as the denominator. RADIATION DOSE REDUCTION: This exam was performed according to the departmental dose-optimization program which includes automated exposure control, adjustment  of the mA and/or kV according to patient size and/or use of iterative reconstruction technique. CONTRAST:  75mL OMNIPAQUE  IOHEXOL  350 MG/ML SOLN COMPARISON:  None Available. FINDINGS: CTA NECK FINDINGS Aortic arch: Aortic atherosclerosis. Great vessel origins are patent without significant stenosis. Right carotid system: No evidence of dissection, stenosis (50% or greater), or occlusion. Left carotid system: No evidence of dissection, stenosis (50% or greater), or occlusion. Vertebral arteries: Codominant. No evidence of dissection, stenosis (50% or greater), or occlusion. Skeleton: No evidence of acute abnormality on limited assessment. Limbus vertebrae at multiple levels. Other neck: No acute abnormality on limited assessment. Upper chest: No acute abnormality on limited assessment. Review of the MIP images confirms the above findings CTA HEAD FINDINGS Anterior circulation: Bilateral intracranial ICAs, MCAs, and ACAs are patent without proximal hemodynamically significant stenosis. Posterior circulation: Bilateral intradural vertebral arteries, basilar artery, and bilateral posterior cerebral arteries are patent  without proximal hemodynamically significant stenosis. Venous sinuses: As permitted by contrast timing, patent. Review of the MIP images confirms the above findings IMPRESSION: No large vessel occlusion or proximal hemodynamically significant stenosis. Electronically Signed   By: Stevenson Elbe M.D.   On: 04/24/2024 03:42   DG Chest Port 1 View Result Date: 04/24/2024 CLINICAL DATA:  Chest pain EXAM: PORTABLE CHEST 1 VIEW COMPARISON:  08/16/2023 FINDINGS: The heart size and mediastinal contours are within normal limits. Both lungs are clear. The visualized skeletal structures are unremarkable. IMPRESSION: No active disease. Electronically Signed   By: Violeta Grey M.D.   On: 04/24/2024 00:01   CT HEAD CODE STROKE WO CONTRAST Result Date: 04/23/2024 CLINICAL DATA:  Code stroke.  Neuro deficit, acute, stroke suspected EXAM: CT HEAD WITHOUT CONTRAST TECHNIQUE: Contiguous axial images were obtained from the base of the skull through the vertex without intravenous contrast. RADIATION DOSE REDUCTION: This exam was performed according to the departmental dose-optimization program which includes automated exposure control, adjustment of the mA and/or kV according to patient size and/or use of iterative reconstruction technique. COMPARISON:  CT head 08/16/2023. FINDINGS: Brain: No evidence of acute infarction, hemorrhage, hydrocephalus, extra-axial collection or mass lesion/mass effect. Vascular: No hyperdense vessel. Skull: No acute fracture. Sinuses/Orbits: No acute finding. ASPECTS [facility] Stroke Program Early CT Score) Total score (0-10 with 10 being normal): 10. IMPRESSION: No evidence of acute intracranial abnormality. ASPECTS is 10. Code stroke imaging results were communicated on 04/23/2024 at 11:53 pm to provider Dr. Margery Sheets Via telephone, who verbally acknowledged these results. Electronically Signed   By: Stevenson Elbe M.D.   On: 04/23/2024 23:54    EKG: Sinus rhythm with left bundle branch block at 67  bpm  ASSESSMENT AND PLAN:   1.  NSTEMI, with new onset chest pain, with elevated troponin (27, 113), with left bundle branch block on ECG, with resolution of chest pain after heparin  drip 2.  Vertigo, negative head CT and brain MRI 3.  Coronary artery disease, 60% stenosis proximal RCA noted on cardiac catheterization 10/07/2017 4.  Chronic kidney disease stage III  Recommendations  1.  Agree with current therapy 2.  Continue heparin  drip 3.  Proceed with left heart cardiac catheterization.  The risk, benefits and alternatives of cardiac catheterization and possible PCI were explained to the patient and informed consent was obtained.  Signed: Percival Brace MD,PhD, [facility] 04/24/2024, 9:46 AM

## 2024-04-24 NOTE — Assessment & Plan Note (Signed)
Will continue Neurontin.

## 2024-04-24 NOTE — Assessment & Plan Note (Signed)
 Will continue statin therapy

## 2024-04-24 NOTE — ED Notes (Signed)
 THIS TECH AND rn CHECKED AND CLEANED PT

## 2024-04-24 NOTE — Progress Notes (Addendum)
 2345 - Code Stroke Activated, Patient already in CT   LKWT 2100. mRS 2. Presents to the ED by EMS with dizziness and chest burning   2345 - Tele-Neurologist paged   2350 - Patient returned to room from CT   0004 - Dr. Nathanael Baker joined stroke cart, CT head results given on camera at this time

## 2024-04-24 NOTE — Progress Notes (Signed)
 ANTICOAGULATION CONSULT NOTE  Pharmacy Consult for heparin  infusion Indication: ACS/STEMI  Allergies  Allergen Reactions   Ace Inhibitors Other (See Comments)    Reaction: unknown   Amoxicillin Diarrhea    Has patient had a PCN reaction causing immediate rash, facial/tongue/throat swelling, SOB or lightheadedness with hypotension: No Has patient had a PCN reaction causing severe rash involving mucus membranes or skin necrosis: No Has patient had a PCN reaction that required hospitalization No Has patient had a PCN reaction occurring within the last 10 years: Yes If all of the above answers are "NO", then may proceed with Cephalosporin use.    Atorvastatin  Other (See Comments)    Reaction: muscle pain   Ezetimibe Other (See Comments)    Reaction: fatigue   Niacin And Related Other (See Comments)    Reaction: flushing    Patient Measurements: Height: 5\' 1"  (154.9 cm) IBW/kg (Calculated) : 47.8 Heparin  Dosing Weight: 51.9 kg  Vital Signs: Temp: 98.3 F (36.8 C) (05/03 2306) Temp Source: Oral (05/03 2306) BP: 150/70 (05/04 0241) Pulse Rate: 74 (05/04 0241)  Labs: Recent Labs    04/23/24 0131 04/23/24 2318 04/24/24 0131  HGB  --  11.5*  11.4*  --   HCT  --  33.6*  33.7*  --   PLT  --  218  217  --   APTT  --  25  --   LABPROT  --  14.4  --   INR  --  1.1  --   CREATININE 1.22* 1.27*  --   TROPONINIHS  --  27* 113*    CrCl cannot be calculated (Unknown ideal weight.).   Medical History: Past Medical History:  Diagnosis Date   Arthritis    CKD (chronic kidney disease) stage 3, GFR 30-59 ml/min (HCC)    COPD (chronic obstructive pulmonary disease) (HCC)    HLD (hyperlipidemia)    HTN (hypertension)    IBS (irritable bowel syndrome)    Lymphoma (HCC)    Migraine     Assessment: Pt is a 89 yo female presenting to ED c/o dizziness, weakness, and confusion found with elevated Troponin I level, trending up  Goal of Therapy:  Heparin  level 0.3-0.7  units/ml Monitor platelets by anticoagulation protocol: Yes   Plan:  Bolus 3100 units x 1 Start heparin  infusion at 700 units/hr Will check HL in 8 hr after start of infusion CBC daily while on heparin   Coretta Dexter, PharmD, [facility] 04/24/2024 3:14 AM

## 2024-04-24 NOTE — Hospital Course (Addendum)
 Hospital course / significant events:   HPI: Sandra Delgado is a 89 y.o. female with medical history significant for COPD, dyslipidemia, hypertension, IBS and stage III CKD, who presented to the emergency room 05/03 with acute onset of chest heaviness and burning graded 8/10 in severity,  dizziness and lightheadedness with vertigo that started around 9 PM 05/03  05/03: to ED. Troponin 27 --> 113, EKG NSR w/ LBBB, CT head neg, teleneurology consult. Started on IV heparin , aspirin  and 1 L bolus of IV normal saline.  05/04: admitted to hospitalist service for NSTEMI --> troponin trend peak 390s, cardiology recs cath tomorrow but patient at this time says she wants to decline this, echo pending, continue heparin       Consultants:  Neurology (Telestroke in ED) Cardiology   Procedures/Surgeries: none      ASSESSMENT & PLAN:   NSTEMI (non-ST elevated myocardial infarction) [facility]) admitted to an observation progressive unit bed. serial troponins and EKGs. aspirin  and Plavix   p.r.n. sublingual nitroglycerin  and morphine  sulfate for pain. IV heparin   beta-blocker. Crestor. 2D echo cardiology following Tentative plan for cardiac cath tomorrow but pt states she does not want to go through with this, will defer to cardiology but will let Dr Parks Bollman know  Dizziness Ddx includes TIA vs vertigo vs presyncope from NSTEMI  Hx migraine Stroke not suspected and neg MRI, no LVO on CTA H/N Neuro Checks Bedside Swallow Eval DVT Prophylaxis IV Fluids, Normal Saline Head of Bed 30 Degrees Euglycemia  Avoid Hyperthermia (PRN Acetaminophen ) DAPT Aspirin  81 mg daily and Clopidogrel  75 mg daily  Dyslipidemia statin   Essential hypertension Meds as above    Peripheral neuropathy Neurontin.   GERD without esophagitis PPI   No concerns based on BMI: Body mass index is 21.62 kg/m.Aaron Aas Significantly low or high BMI is associated with higher medical risk.  Underweight - under 18  overweight  - 25 to 29 obese - 30 or more Class 1 obesity: BMI of 30.0 to 34 Class 2 obesity: BMI of 35.0 to 39 Class 3 obesity: BMI of 40.0 to 49 Super Morbid Obesity: BMI 50-59 Super-super Morbid Obesity: BMI 60+ Healthy nutrition and physical activity advised as adjunct to other disease management and risk reduction treatments    DVT prophylaxis: IV heparin  IV fluids: no continuous IV fluids  Nutrition: cardiac diet  Central lines / other devices: none  Code Status: DNR ACP documentation reviewed:  none on file in [facility]  TOC needs: TBD Medical barriers to dispo: heparin  IV, potential cardiac cath. Expected medical readiness for discharge 1-2 days / per cardiology clearance.

## 2024-04-24 NOTE — ED Notes (Signed)
 Ambulated pt to the bathroom and returned to bed and provided pt with warm blankets.

## 2024-04-24 NOTE — ED Notes (Signed)
 This RN received report from Wilbur Handing RN and performed care handoff. This RN introduced self to pt. Call light in reach, bed wheels locked, side rails raised, pt updated on plan of care. Rounding completed. Family bedside.

## 2024-04-24 NOTE — ED Notes (Signed)
 This nurse assumed care of pt at this time.

## 2024-04-24 NOTE — Progress Notes (Signed)
  Brief Progress Note (See full H&P from earlier today)   Hospital course / significant events:   HPI: Sandra Delgado is a 89 y.o. female with medical history significant for COPD, dyslipidemia, hypertension, IBS and stage III CKD, who presented to the emergency room 05/03 with acute onset of chest heaviness and burning graded 8/10 in severity,  dizziness and lightheadedness with vertigo that started around 9 PM 05/03  05/03: to ED. Troponin 27 --> 113, EKG NSR w/ LBBB, CT head neg, teleneurology consult. Started on IV heparin , aspirin  and 1 L bolus of IV normal saline.  05/04: admitted to hospitalist service for NSTEMI --> troponin trend peak 390s, cardiology recs cath tomorrow but patient at this time says she wants to decline this, echo pending, continue heparin       Consultants:  Neurology (Telestroke in ED) Cardiology   Procedures/Surgeries: none      ASSESSMENT & PLAN:   NSTEMI (non-ST elevated myocardial infarction) [facility]) admitted to an observation progressive unit bed. serial troponins and EKGs. aspirin  and Plavix   p.r.n. sublingual nitroglycerin  and morphine  sulfate for pain. IV heparin   beta-blocker. Crestor. 2D echo cardiology following Tentative plan for cardiac cath tomorrow but pt states she does not want to go through with this, will defer to cardiology but will let Dr Parks Bollman know  Dizziness Ddx includes TIA vs vertigo vs presyncope from NSTEMI  Hx migraine Stroke not suspected and neg MRI, no LVO on CTA H/N Neuro Checks Bedside Swallow Eval DVT Prophylaxis IV Fluids, Normal Saline Head of Bed 30 Degrees Euglycemia  Avoid Hyperthermia (PRN Acetaminophen ) DAPT Aspirin  81 mg daily and Clopidogrel  75 mg daily  Dyslipidemia statin   Essential hypertension Meds as above    Peripheral neuropathy Neurontin.   GERD without esophagitis PPI   No concerns based on BMI: Body mass index is 21.62 kg/m.Aaron Aas Significantly low or high BMI is associated  with higher medical risk.  Underweight - under 18  overweight - 25 to 29 obese - 30 or more Class 1 obesity: BMI of 30.0 to 34 Class 2 obesity: BMI of 35.0 to 39 Class 3 obesity: BMI of 40.0 to 49 Super Morbid Obesity: BMI 50-59 Super-super Morbid Obesity: BMI 60+ Healthy nutrition and physical activity advised as adjunct to other disease management and risk reduction treatments    DVT prophylaxis: IV heparin  IV fluids: no continuous IV fluids  Nutrition: cardiac diet  Central lines / other devices: none  Code Status: DNR ACP documentation reviewed:  none on file in [facility]  TOC needs: TBD Medical barriers to dispo: heparin  IV, potential cardiac cath. Expected medical readiness for discharge 1-2 days / per cardiology clearance.        Subjective: Pt feeling well no chest pain  Family at bedside    Objective: Relevant new results:  Troponin has peaked  Physical Exam:  BP (!) 145/59   Pulse 64   Temp 98.6 F (37 C) (Oral)   Resp (!) 27   Ht 5\' 1"  (1.549 m)   Wt 51.9 kg   SpO2 98%   BMI 21.62 kg/m  Constitutional:  General Appearance: alert, well-developed, well-nourished, NAD Respiratory: Normal respiratory effort Breath sounds normal, no wheeze/rhonchi/rales Cardiovascular: S1/S2 normal No lower extremity edema Gastrointestinal: Nontender, no masses Psychiatric: Normal judgment/insight Normal mood and affect

## 2024-04-24 NOTE — Assessment & Plan Note (Signed)
-   The patient had a negative brain MRI. - Will place her on as needed Antivert with possibility of benign positional vertigo. - Dizziness could also be related to her non-STEMI.

## 2024-04-24 NOTE — Assessment & Plan Note (Signed)
 Continue PPI therapy.

## 2024-04-24 NOTE — Assessment & Plan Note (Signed)
Will continue antihypertensive therapy.

## 2024-04-24 NOTE — ED Notes (Signed)
 Pt back from MRI

## 2024-04-24 NOTE — ED Notes (Signed)
 Echo at bedside

## 2024-04-24 NOTE — ED Notes (Signed)
 Sandra Delgado, Daughter in law [phone]

## 2024-04-24 NOTE — Progress Notes (Signed)
 PT Cancellation Note  Patient Details Name: Sandra Delgado MRN: [id number] DOB: 10/17/1933   Cancelled Treatment:    Reason Eval/Treat Not Completed: Other (comment). Consult received and chart reviewed. Pt is pending heart cath and per MD will hold until that is completed.   Tranisha Tissue 04/24/2024, 1:21 PM Amparo Balk, PT, DPT, [facility] [phone]

## 2024-04-24 NOTE — H&P (Signed)
 [place]   PATIENT NAME: Sandra Delgado    MR#:  [id number]  DATE OF BIRTH:  October 28, 1933  DATE OF ADMISSION:  04/23/2024  PRIMARY CARE PHYSICIAN: Sandra Moulding, MD   Patient is coming from: Home  REQUESTING/REFERRING PHYSICIAN: Buell Carmin, MD  CHIEF COMPLAINT:   Chief Complaint  Patient presents with   Dizziness    HISTORY OF PRESENT ILLNESS:  Sandra Delgado is a 89 y.o. female with medical history significant for COPD, dyslipidemia, hypertension, IBS and stage III CKD, who presented to the emergency room with acute onset of chest heaviness and burning graded 8/10 in severity with no nausea or vomiting or diaphoresis or radiation.  She denied any associated dyspnea or palpitations.  No cough or wheezing or hemoptysis.  No leg pain or edema or recent travels or surgeries.  She admitted to dizziness and lightheadedness with vertigo that started around 9 PM.  No tinnitus or vertigo.  No urinary or stool incontinence.  No other paresthesias or focal muscle weakness.  No dysuria, degree of hematuria or flank pain.  No bleeding diathesis.  ED Course: When the patient came to the ER, BP was 155/83 with otherwise normal vital signs.  Labs revealed BUN of 33 with creatinine 1.27 and high-sensitivity troponin was 27 later 113.  CBC showed mild anemia better than previous levels with hemoglobin of 11.5 hematocrit 33.6. EKG as reviewed by me : EKG: Normal sinus rhythm with a rate of 65 with left bundle branch block. Imaging: Chest x-ray showed no acute cardiopulmonary disease. Noncontrasted head CT scan revealed no acute intracranial normalities. - Brain MRI came back negative.  The patient was given IV heparin , aspirin  and 1 L bolus of IV normal saline.  She will be admitted to observation progressive unit bed for further evaluation and management.  PAST MEDICAL HISTORY:   Past Medical History:  Diagnosis Date   Arthritis    CKD (chronic kidney disease) stage 3, GFR 30-59  ml/min (HCC)    COPD (chronic obstructive pulmonary disease) (HCC)    HLD (hyperlipidemia)    HTN (hypertension)    IBS (irritable bowel syndrome)    Lymphoma (HCC)    Migraine     PAST SURGICAL HISTORY:   Past Surgical History:  Procedure Laterality Date   ABDOMINAL HYSTERECTOMY     APPENDECTOMY     COLONOSCOPY     LEFT HEART CATH AND CORONARY ANGIOGRAPHY N/A 10/07/2017   Procedure: LEFT HEART CATH AND CORONARY ANGIOGRAPHY;  Surgeon: Ronney Cola, MD;  Location: [facility] INVASIVE CV LAB;  Service: Cardiovascular;  Laterality: N/A;    SOCIAL HISTORY:   Social History   Tobacco Use   Smoking status: Never   Smokeless tobacco: Never  Substance Use Topics   Alcohol use: Yes    Alcohol/week: 0.0 standard drinks of alcohol    Comment: occassional    FAMILY HISTORY:   Family History  Problem Relation Age of Onset   Colon cancer Mother     DRUG ALLERGIES:   Allergies  Allergen Reactions   Ace Inhibitors Other (See Comments)    Reaction: unknown   Amoxicillin Diarrhea    Has patient had a PCN reaction causing immediate rash, facial/tongue/throat swelling, SOB or lightheadedness with hypotension: No Has patient had a PCN reaction causing severe rash involving mucus membranes or skin necrosis: No Has patient had a PCN reaction that required hospitalization No Has patient had a PCN reaction occurring within the last 10  years: Yes If all of the above answers are "NO", then may proceed with Cephalosporin use.    Atorvastatin  Other (See Comments)    Reaction: muscle pain   Ezetimibe Other (See Comments)    Reaction: fatigue   Niacin And Related Other (See Comments)    Reaction: flushing    REVIEW OF SYSTEMS:   ROS As per history of present illness. All pertinent systems were reviewed above. Constitutional, HEENT, cardiovascular, respiratory, GI, GU, musculoskeletal, neuro, psychiatric, endocrine, integumentary and hematologic systems were reviewed and are otherwise  negative/unremarkable except for positive findings mentioned above in the HPI.   MEDICATIONS AT HOME:   Prior to Admission medications   Medication Sig Start Date End Date Taking? Authorizing Provider  atorvastatin  (LIPITOR ) 40 MG tablet Take 1 tablet (40 mg total) by mouth daily at 6 PM. 10/07/17  Yes Christina Coyer, MD  carvedilol  (COREG ) 3.125 MG tablet Take 3.125 mg by mouth 2 (two) times daily with a meal.   Yes [provider]  clopidogrel  (PLAVIX ) 75 MG tablet Take 75 mg by mouth daily.   Yes [provider]  gabapentin (NEURONTIN) 300 MG capsule Take 300 mg by mouth at bedtime.   Yes [provider]  losartan  (COZAAR ) 100 MG tablet Take 100 mg by mouth daily.   Yes [provider]  mirtazapine (REMERON) 7.5 MG tablet Take 7.5 mg by mouth at bedtime. 02/11/24  Yes [provider]  omeprazole (PRILOSEC) 40 MG capsule Take 40 mg by mouth 2 (two) times daily.    Yes [provider]  pantoprazole  (PROTONIX ) 40 MG tablet Take 1 tablet by mouth daily. 12/17/23  Yes [provider]  pravastatin  (PRAVACHOL ) 80 MG tablet Take 1 tablet by mouth at bedtime. 10/20/23  Yes [provider]  aspirin  EC 81 MG EC tablet Take 1 tablet (81 mg total) by mouth daily. Patient not taking: Reported on 08/16/2023 10/07/17   Christina Coyer, MD      VITAL SIGNS:  Blood pressure (!) 150/70, pulse 64, temperature 97.9 F (36.6 C), temperature source Oral, resp. rate 20, height 5\' 1"  (1.549 m), weight 51.9 kg, SpO2 99%.  PHYSICAL EXAMINATION:  Physical Exam  GENERAL:  89 y.o.-year-old patient lying in the bed with no acute distress.  EYES: Pupils equal, round, reactive to light and accommodation. No scleral icterus. Extraocular muscles intact.  HEENT: Head atraumatic, normocephalic. Oropharynx and nasopharynx clear.  NECK:  Supple, no jugular venous distention. No thyroid enlargement, no tenderness.  LUNGS: Normal breath  sounds bilaterally, no wheezing, rales,rhonchi or crepitation. No use of accessory muscles of respiration.  CARDIOVASCULAR: Regular rate and rhythm, S1, S2 normal. No murmurs, rubs, or gallops.  ABDOMEN: Soft, nondistended, nontender. Bowel sounds present. No organomegaly or mass.  EXTREMITIES: No pedal edema, cyanosis, or clubbing.  NEUROLOGIC: Cranial nerves II through XII are intact. Muscle strength 5/5 in all extremities. Sensation intact. Gait not checked.  PSYCHIATRIC: The patient is alert and oriented x 3.  Normal affect and good eye contact. SKIN: No obvious rash, lesion, or ulcer.   LABORATORY PANEL:   CBC Recent Labs  Lab 04/23/24 2318  WBC 8.1  8.1  HGB 11.5*  11.4*  HCT 33.6*  33.7*  PLT 218  217   ------------------------------------------------------------------------------------------------------------------  Chemistries  Recent Labs  Lab 04/23/24 0131 04/23/24 2318  NA 140 139  K 4.8 4.2  CL 111 109  CO2 21* 22  GLUCOSE 103* 111*  BUN 31* 33*  CREATININE 1.22* 1.27*  CALCIUM  8.6* 8.7*  AST 27  --   ALT 9  --   ALKPHOS 35*  --   BILITOT 1.5*  --    ------------------------------------------------------------------------------------------------------------------  Cardiac Enzymes No results for input(s): "TROPONINI" in the last 168 hours. ------------------------------------------------------------------------------------------------------------------  RADIOLOGY:  MR BRAIN WO CONTRAST Result Date: 04/24/2024 CLINICAL DATA:  Transient ischemic attack (TIA). EXAM: MRI HEAD WITHOUT CONTRAST TECHNIQUE: Multiplanar, multiecho pulse sequences of the brain and surrounding structures were obtained without intravenous contrast. COMPARISON:  CT head Apr 23, 2024. FINDINGS: Brain: No acute infarction, hemorrhage, hydrocephalus, extra-axial collection or mass lesion. Mild for age scattered T2/FLAIR hyperintensities in the white matter, compatible with chronic  microvascular ischemic change. Vascular: Major arterial flow voids are maintained at the skull base. Skull and upper cervical spine: Normal marrow signal. Sinuses/Orbits: Clear sinuses.  No acute orbital findings. Other: No mastoid effusions. IMPRESSION: No acute abnormality. Electronically Signed   By: Stevenson Elbe M.D.   On: 04/24/2024 03:56   CT Angio Head Neck W WO CM Result Date: 04/24/2024 CLINICAL DATA:  Transient ischemic attack (TIA) EXAM: CT ANGIOGRAPHY HEAD AND NECK WITH AND WITHOUT CONTRAST TECHNIQUE: Multidetector CT imaging of the head and neck was performed using the standard protocol during bolus administration of intravenous contrast. Multiplanar CT image reconstructions and MIPs were obtained to evaluate the vascular anatomy. Carotid stenosis measurements (when applicable) are obtained utilizing NASCET criteria, using the distal internal carotid diameter as the denominator. RADIATION DOSE REDUCTION: This exam was performed according to the departmental dose-optimization program which includes automated exposure control, adjustment of the mA and/or kV according to patient size and/or use of iterative reconstruction technique. CONTRAST:  75mL OMNIPAQUE  IOHEXOL  350 MG/ML SOLN COMPARISON:  None Available. FINDINGS: CTA NECK FINDINGS Aortic arch: Aortic atherosclerosis. Great vessel origins are patent without significant stenosis. Right carotid system: No evidence of dissection, stenosis (50% or greater), or occlusion. Left carotid system: No evidence of dissection, stenosis (50% or greater), or occlusion. Vertebral arteries: Codominant. No evidence of dissection, stenosis (50% or greater), or occlusion. Skeleton: No evidence of acute abnormality on limited assessment. Limbus vertebrae at multiple levels. Other neck: No acute abnormality on limited assessment. Upper chest: No acute abnormality on limited assessment. Review of the MIP images confirms the above findings CTA HEAD FINDINGS Anterior  circulation: Bilateral intracranial ICAs, MCAs, and ACAs are patent without proximal hemodynamically significant stenosis. Posterior circulation: Bilateral intradural vertebral arteries, basilar artery, and bilateral posterior cerebral arteries are patent without proximal hemodynamically significant stenosis. Venous sinuses: As permitted by contrast timing, patent. Review of the MIP images confirms the above findings IMPRESSION: No large vessel occlusion or proximal hemodynamically significant stenosis. Electronically Signed   By: Stevenson Elbe M.D.   On: 04/24/2024 03:42   DG Chest Port 1 View Result Date: 04/24/2024 CLINICAL DATA:  Chest pain EXAM: PORTABLE CHEST 1 VIEW COMPARISON:  08/16/2023 FINDINGS: The heart size and mediastinal contours are within normal limits. Both lungs are clear. The visualized skeletal structures are unremarkable. IMPRESSION: No active disease. Electronically Signed   By: Violeta Grey M.D.   On: 04/24/2024 00:01   CT HEAD CODE STROKE WO CONTRAST Result Date: 04/23/2024 CLINICAL DATA:  Code stroke.  Neuro deficit, acute, stroke suspected EXAM: CT HEAD WITHOUT CONTRAST TECHNIQUE: Contiguous axial images were obtained from the base of the skull through the vertex without intravenous contrast. RADIATION DOSE REDUCTION: This exam was performed according to the departmental dose-optimization program which includes automated exposure control, adjustment of the mA and/or kV  according to patient size and/or use of iterative reconstruction technique. COMPARISON:  CT head 08/16/2023. FINDINGS: Brain: No evidence of acute infarction, hemorrhage, hydrocephalus, extra-axial collection or mass lesion/mass effect. Vascular: No hyperdense vessel. Skull: No acute fracture. Sinuses/Orbits: No acute finding. ASPECTS [facility] Stroke Program Early CT Score) Total score (0-10 with 10 being normal): 10. IMPRESSION: No evidence of acute intracranial abnormality. ASPECTS is 10. Code stroke imaging  results were communicated on 04/23/2024 at 11:53 pm to provider Dr. Margery Sheets Via telephone, who verbally acknowledged these results. Electronically Signed   By: Stevenson Elbe M.D.   On: 04/23/2024 23:54      IMPRESSION AND PLAN:  Assessment and Plan: * NSTEMI (non-ST elevated myocardial infarction) [facility]) - The patient will be admitted to an observation progressive unit bed. - Will follow serial troponins and EKGs. - The patient will be placed on aspirin  and Plavix  as well as p.r.n. sublingual nitroglycerin  and morphine  sulfate for pain. - Continue IV heparin  and beta-blocker therapy. - Will place her on Crestor. - 2D echo will be obtained. - We will obtain a cardiology consult in a.m. for further cardiac risk stratification. - I notified Dr. Parks Bollman about the patient   Vertigo - The patient had a negative brain MRI. - Will place her on as needed Antivert with possibility of benign positional vertigo. - Dizziness could also be related to her non-STEMI.  Dyslipidemia - Will continue statin therapy.  Essential hypertension - Will continue antihypertensive therapy  Peripheral neuropathy - Will continue Neurontin.  GERD without esophagitis - Continue PPI therapy.   DVT prophylaxis: IV heparin . Advanced Care Planning:  Code Status: She is DNR only.  This was discussed with her. Family Communication:  The plan of care was discussed in details with the patient (and family). I answered all questions. The patient agreed to proceed with the above mentioned plan. Further management will depend upon hospital course. Disposition Plan: Back to previous home environment Consults called: Cardiology. All the records are reviewed and case discussed with ED provider.  Status is: Observation   I certify that at the time of admission, it is my clinical judgment that the patient will require hospital care extending LESS than 2 midnights.                            Dispo: The patient is  from: Home              Anticipated d/c is to: Home              Patient currently is not medically stable to d/c.              Difficult to place patient: No  Virgene Griffin M.D on 04/24/2024 at 7:46 AM  [facility]   From 7 PM-7 AM, contact night-coverage [email or web address]  CC: Primary care physician; Sandra Moulding, MD

## 2024-04-24 NOTE — Progress Notes (Signed)
 SLP Cancellation Note  Patient Details Name: Sandra Delgado MRN: [id number] DOB: [birth year]   Cancelled treatment:       Reason Eval/Treat Not Completed: SLP screened, no needs identified, will sign off (Per chart review, stroke work up negative. NIHSS = 0. Pt passed dysphagia screening.)  [name], M.S., CCC-SLP Speech-Language Pathologist [facility] [phone] [place])  [name] 04/24/2024, 8:11 AM

## 2024-04-24 NOTE — Consult Note (Signed)
 [facility] Consult Services   Patient Name:   Sandra Delgado, Sandra Delgado Date of Birth:   05-Dec-1933 Identification Number:   MRN - [id number] Date of Service:   04/23/2024 23:47:13  Diagnosis:       R42 - Dizziness/ Vertigo/ Giddiness        G45.9 - Transient cerebral ischemic attack, unspecified  Impression:      89 yo LH F with PMH of HTN, HLD, COPD, who presents to ED with transient dizziness.  NIHSS of 0. CTH without hemorrhage.  Thrombolytics not recommended due to resolved symptoms.  Differential includes vertigo or presyncope from any cause vs transient ischemic attack of the posterior circulation.  Continue home aspirin  and plavix .  Recommend infectious/metabolic work up including UA, TSH, B12, CXR, CMP, CBC, orthostatic vitals, and transient ischemic attack work up including TTE, routine CTA head and neck, and MRI brain w/o.  Our recommendations are outlined below.  Recommendations:        Neuro Checks       Bedside Swallow Eval       DVT Prophylaxis       IV Fluids, Normal Saline       Head of Bed 30 Degrees       Euglycemia and Avoid Hyperthermia (PRN Acetaminophen )       Initiate dual antiplatelet therapy with Aspirin  81 mg daily and Clopidogrel  75 mg daily.    ------------------------------------------------------------------------------  Advanced Imaging: Advanced Imaging Deferred because:  Stroke not suspected with clinical presentation and exam   Metrics: Last Known Well: 04/23/2024 21:00:15 Dispatch Time: 04/23/2024 23:45:58 Arrival Time: 04/23/2024 22:55:15 Initial Response Time: 04/23/2024 23:54:17 Symptoms: Dizziness. Initial patient interaction: 04/24/2024 00:06:44 NIHSS Assessment Completed: 04/24/2024 00:22:18 Patient is not a candidate for Thrombolytic. Thrombolytic Medical Decision: 04/24/2024 00:22:23 Patient was not deemed candidate for Thrombolytic because of following reasons: Resolved symptoms .  CT head  showed no acute hemorrhage or acute core infarct.  ED Physician not notified of diagnostic impression and management plan because Unable to reach by phone    ------------------------------------------------------------------------------  History of Present Illness: Patient is a 89 year old Female.  Patient was brought by EMS for symptoms of Dizziness. 89 yo LH F with PMH of HTN, HLD, COPD, who presents to ED with transient dizziness. Patient reports onset of room spinning dizziness this evening. Has never had that dizziness before and it resolved after a couple of hours. Also had chest burning which is improving. Denies HA, vomiting, vision or speech changes.      Past Medical History:      Hypertension      Hyperlipidemia      Coronary Artery Disease Other PMH:  hypothyroidism, CKD  Medications:  No Anticoagulant use  Antiplatelet use: Yes aspirin , plavix  Reviewed EMR for current medications  Allergies:  Reviewed  Social History: Smoking: No  Family History:  There is no family history of premature cerebrovascular disease pertinent to this consultation  ROS : 14 Points Review of Systems was performed and was negative except mentioned in HPI.  Past Surgical History: There Is No Surgical History Contributory To Today's Visit     Examination: BP(155/83), Pulse(66), Blood Glucose(111) 1A: Level of Consciousness - Alert; keenly responsive + 0 1B: Ask Month and Age - Both Questions Right + 0 1C: Blink Eyes & Squeeze Hands - Performs Both Tasks + 0 2: Test Horizontal Extraocular Movements - Normal + 0 3: Test Visual Fields - No Visual Loss + 0 4: Test Facial Palsy (Use  Grimace if Obtunded) - Normal symmetry + 0 5A: Test Left Arm Motor Drift - No Drift for 10 Seconds + 0 5B: Test Right Arm Motor Drift - No Drift for 10 Seconds + 0 6A: Test Left Leg Motor Drift - No Drift for 5 Seconds + 0 6B: Test Right Leg Motor Drift - No Drift for 5 Seconds + 0 7: Test Limb  Ataxia (FNF/Heel-Shin) - No Ataxia + 0 8: Test Sensation - Normal; No sensory loss + 0 9: Test Language/Aphasia - Normal; No aphasia + 0 10: Test Dysarthria - Normal + 0 11: Test Extinction/Inattention - No abnormality + 0  NIHSS Score: 0   Pre-Morbid Modified Rankin Scale: 3 Points = Moderate disability; requiring some help, but able to walk without assistance   This consult was conducted in real time using interactive audio and Immunologist. Patient was informed of the technology being used for this visit and agreed to proceed. Patient located in hospital and provider located at home/office setting.   Patient is being evaluated for possible acute neurologic impairment and high probability of imminent or life-threatening deterioration. I spent total of 51 minutes providing care to this patient, including time for face to face visit via telemedicine, review of medical records, imaging studies and discussion of findings with providers, the patient and/or family.   Dr Greig Leather   [facility] For Inpatient follow-up with [facility] physician please call [facility] at [phone]. As we are not an outpatient service for any post hospital discharge needs please contact the hospital for assistance. If you have any questions for the [facility] physicians or need to reconsult for clinical or diagnostic changes please contact us  via [facility] at [phone].

## 2024-04-24 NOTE — ED Notes (Signed)
 This RN gave report to [facility] and performed care handoff. Call light in reach, bed wheels locked, side rails raised, pt updated on plan of care. Rounding completed.

## 2024-04-24 NOTE — ED Notes (Addendum)
 This nurse went in room to assess pt. Pt stated she was experiencing a "burning" sensation in her chest. Repeat EKG obtained. Amalia Jung, MD notified. Ordered for GI cocktail and repeat troponin. This nurse contacted lab for assistance to obtain repeat troponin. Lab stated that they didn't have an order appear on their side. Repeat troponin obtained by this nurse and sent. While in room, pt refused GI cocktail stating that her chest was no longer burning. Leron Rankins, MD notified.

## 2024-04-24 NOTE — Progress Notes (Signed)
 ANTICOAGULATION CONSULT NOTE  Pharmacy Consult for heparin  infusion Indication: ACS/STEMI  Allergies  Allergen Reactions   Ace Inhibitors Other (See Comments)    Reaction: unknown   Amoxicillin Diarrhea    Has patient had a PCN reaction causing immediate rash, facial/tongue/throat swelling, SOB or lightheadedness with hypotension: No Has patient had a PCN reaction causing severe rash involving mucus membranes or skin necrosis: No Has patient had a PCN reaction that required hospitalization No Has patient had a PCN reaction occurring within the last 10 years: Yes If all of the above answers are "NO", then may proceed with Cephalosporin use.    Atorvastatin  Other (See Comments)    Reaction: muscle pain   Ezetimibe Other (See Comments)    Reaction: fatigue   Niacin And Related Other (See Comments)    Reaction: flushing   Patient Measurements: Height: 5\' 1"  (154.9 cm) Weight: 51.9 kg (114 lb 6.4 oz) IBW/kg (Calculated) : 47.8 Heparin  Dosing Weight: 51.9 kg  Vital Signs: Temp: 98.6 F (37 C) (05/04 1201) Temp Source: Oral (05/04 1201) BP: 145/59 (05/04 1400) Pulse Rate: 64 (05/04 1400)  Labs: Recent Labs    04/23/24 0131 04/23/24 2318 04/23/24 2318 04/24/24 0131 04/24/24 0749 04/24/24 0926 04/24/24 1450  HGB  --  11.5*  11.4*  --   --   --   --   --   HCT  --  33.6*  33.7*  --   --   --   --   --   PLT  --  218  217  --   --   --   --   --   APTT  --  25  --   --   --   --   --   LABPROT  --  14.4  --   --   --   --   --   INR  --  1.1  --   --   --   --   --   HEPARINUNFRC  --   --   --   --   --   --  0.85*  CREATININE 1.22* 1.27*  --   --   --   --   --   TROPONINIHS  --  27*   < > 113* 395* 389*  --    < > = values in this interval not displayed.    Estimated Creatinine Clearance: 23.1 mL/min (A) (by C-G formula based on SCr of 1.27 mg/dL (H)).   Medical History: Past Medical History:  Diagnosis Date   Arthritis    CKD (chronic kidney disease) stage  3, GFR 30-59 ml/min (HCC)    COPD (chronic obstructive pulmonary disease) (HCC)    HLD (hyperlipidemia)    HTN (hypertension)    IBS (irritable bowel syndrome)    Lymphoma (HCC)    Migraine     Assessment: Pt is a 89 yo female presenting to ED c/o dizziness, weakness, and confusion found with elevated Troponin I level, trending up  Date Time HL Rate/Comment 5/4 1450 0.85 SUPRAtherapeutic  Goal of Therapy:  Heparin  level 0.3-0.7 units/ml Monitor platelets by anticoagulation protocol: Yes   Plan:  Heparin  level is supratherapeutic, per RN, no signs/symptoms of bleeding Decrease heparin  infusion rate to 600 units/hour Check heparin  level 8 hours after rate change Monitor CBC and signs/symptoms of bleeding  Thank you for involving pharmacy in this patient's care.   Ananias Balls, PharmD Clinical Pharmacist 04/24/2024 3:18 PM

## 2024-04-24 NOTE — Assessment & Plan Note (Addendum)
-   The patient will be admitted to an observation progressive unit bed. - Will follow serial troponins and EKGs. - The patient will be placed on aspirin  and Plavix  as well as p.r.n. sublingual nitroglycerin  and morphine  sulfate for pain. - Continue IV heparin  and beta-blocker therapy. - Will place her on Crestor. - 2D echo will be obtained. - We will obtain a cardiology consult in a.m. for further cardiac risk stratification. - I notified Dr. Parks Bollman about the patient

## 2024-04-25 ENCOUNTER — Encounter
Admission: EM | Disposition: A | Discharge status: Home Health | Payer: Self-pay | Source: Home / Self Care | Attending: Family Medicine

## 2024-04-25 DIAGNOSIS — I214 Non-ST elevation (NSTEMI) myocardial infarction: Secondary | ICD-10-CM | POA: Diagnosis not present

## 2024-04-25 LAB — BASIC METABOLIC PANEL WITH GFR
Anion gap: 10 (ref 5–15)
BUN: 22 mg/dL (ref 8–23)
CO2: 22 mmol/L (ref 22–32)
Calcium: 8.8 mg/dL — ABNORMAL LOW (ref 8.9–10.3)
Chloride: 109 mmol/L (ref 98–111)
Creatinine, Ser: 1.1 mg/dL — ABNORMAL HIGH (ref 0.44–1.00)
GFR, Estimated: 48 mL/min — ABNORMAL LOW (ref >60)
Glucose, Bld: 100 mg/dL — ABNORMAL HIGH (ref 70–99)
Potassium: 3.9 mmol/L (ref 3.5–5.1)
Sodium: 141 mmol/L (ref 135–145)

## 2024-04-25 LAB — HEPARIN LEVEL (UNFRACTIONATED)
Heparin Unfractionated: 0.59 [IU]/mL (ref 0.30–0.70)
Heparin Unfractionated: 0.58 [IU]/mL (ref 0.30–0.70)

## 2024-04-25 LAB — CBC
HCT: 36.8 % (ref 36.0–46.0)
Hemoglobin: 12.4 g/dL (ref 12.0–15.0)
MCH: 32.6 pg (ref 26.0–34.0)
MCHC: 33.7 g/dL (ref 30.0–36.0)
MCV: 96.8 fL (ref 80.0–100.0)
Platelets: 199 10*3/uL (ref 150–400)
RBC: 3.8 MIL/uL — ABNORMAL LOW (ref 3.87–5.11)
RDW: 12.2 % (ref 11.5–15.5)
WBC: 7.2 10*3/uL (ref 4.0–10.5)
nRBC: 0 % (ref 0.0–0.2)

## 2024-04-25 LAB — LIPID PANEL
Cholesterol: 133 mg/dL (ref 0–200)
HDL: 61 mg/dL (ref >40)
LDL Cholesterol: 55 mg/dL (ref 0–99)
Total CHOL/HDL Ratio: 2.2 ratio
Triglycerides: 85 mg/dL (ref <150)
VLDL: 17 mg/dL (ref 0–40)

## 2024-04-25 LAB — TROPONIN I (HIGH SENSITIVITY): Troponin I (High Sensitivity): 688 ng/L (ref <18)

## 2024-04-25 SURGERY — LEFT HEART CATH AND CORONARY ANGIOGRAPHY
Anesthesia: Moderate Sedation

## 2024-04-25 MED ORDER — PANTOPRAZOLE SODIUM 40 MG PO TBEC
40.0000 mg | DELAYED_RELEASE_TABLET | Freq: Every day | ORAL | Status: DC
  Administered 2024-04-25 – 2024-04-26 (×2): 40 mg via ORAL
  Filled 2024-04-25 (×2): qty 1

## 2024-04-25 MED ORDER — ASPIRIN 81 MG PO TBEC
81.0000 mg | DELAYED_RELEASE_TABLET | Freq: Every day | ORAL | Status: DC
  Administered 2024-04-26: 81 mg via ORAL
  Filled 2024-04-25: qty 1

## 2024-04-25 MED ORDER — ISOSORBIDE MONONITRATE ER 30 MG PO TB24
30.0000 mg | ORAL_TABLET | Freq: Every day | ORAL | Status: DC
  Administered 2024-04-25 – 2024-04-26 (×2): 30 mg via ORAL
  Filled 2024-04-25 (×2): qty 1

## 2024-04-25 MED ORDER — ASPIRIN 81 MG PO TBEC: 81.0000 mg | DELAYED_RELEASE_TABLET | Freq: Every day | ORAL | Status: DC

## 2024-04-25 MED ORDER — CARVEDILOL 3.125 MG PO TABS
3.1250 mg | ORAL_TABLET | Freq: Two times a day (BID) | ORAL | Status: DC
  Administered 2024-04-25 – 2024-04-26 (×2): 3.125 mg via ORAL
  Filled 2024-04-25 (×2): qty 1

## 2024-04-25 MED ORDER — MIRTAZAPINE 15 MG PO TABS
7.5000 mg | ORAL_TABLET | Freq: Every day | ORAL | Status: DC
  Filled 2024-04-25: qty 1

## 2024-04-25 MED ORDER — PRAVASTATIN SODIUM 40 MG PO TABS
80.0000 mg | ORAL_TABLET | Freq: Every day | ORAL | Status: DC
  Filled 2024-04-25: qty 2

## 2024-04-25 MED ORDER — LOSARTAN POTASSIUM 50 MG PO TABS
100.0000 mg | ORAL_TABLET | Freq: Every day | ORAL | Status: DC
  Administered 2024-04-25 – 2024-04-26 (×2): 100 mg via ORAL
  Filled 2024-04-25 (×2): qty 2

## 2024-04-25 MED ORDER — LORAZEPAM 2 MG/ML IJ SOLN
0.5000 mg | Freq: Once | INTRAMUSCULAR | Status: AC
  Administered 2024-04-25: 0.5 mg via INTRAVENOUS
  Filled 2024-04-25: qty 1

## 2024-04-25 MED ORDER — CLOPIDOGREL BISULFATE 75 MG PO TABS
75.0000 mg | ORAL_TABLET | Freq: Every day | ORAL | Status: DC
  Administered 2024-04-25 – 2024-04-26 (×2): 75 mg via ORAL
  Filled 2024-04-25 (×2): qty 1

## 2024-04-25 NOTE — Progress Notes (Signed)
 PROGRESS NOTE    Sandra Delgado   JXB:[id number] DOB: [birth year]  DOA: 04/23/2024 Date of Service: 04/25/24 which is hospital day 1  PCP: Jimmy Moulding, MD    Hospital course / significant events:   HPI: Sandra Delgado is a 89 y.o. female with medical history significant for COPD, dyslipidemia, hypertension, IBS and stage III CKD, who presented to the emergency room 05/03 with acute onset of chest heaviness and burning graded 8/10 in severity,  dizziness and lightheadedness with vertigo that started around 9 PM 05/03  05/03: to ED. Troponin 27 --> 113, EKG NSR w/ LBBB, CT head neg, teleneurology consult. Started on IV heparin , aspirin  and 1 L bolus of IV normal saline.  05/04: admitted to hospitalist service for NSTEMI --> troponin trend peak 390s, cardiology recs cath tomorrow but patient at this time says she wants to decline this, echo EF 60-65% and grade 1 diast df, continue heparin .  05/05: declined cath, continue heparin  x48h per cardiology     Consultants:  Neurology (Telestroke in ED) Cardiology   Procedures/Surgeries: none      ASSESSMENT & PLAN:   NSTEMI (non-ST elevated myocardial infarction) [facility]) admitted to an observation progressive unit bed. serial troponins and EKGs. aspirin  and Plavix   p.r.n. sublingual nitroglycerin  and morphine  sulfate for pain. IV heparin   Home beta-blocker, ARB Home pravastatin . 2D echo cardiology following Pt has declined cardiac cath  Dizziness Ddx includes TIA vs vertigo vs presyncope from NSTEMI  Hx migraine Stroke not suspected and neg MRI, no LVO on CTA H/N Neuro Checks can dc Bedside Swallow Eval DVT Prophylaxis IV Fluids, Normal Saline Head of Bed 30 Degrees Euglycemia  Avoid Hyperthermia (PRN Acetaminophen ) DAPT Aspirin  81 mg daily and Clopidogrel  75 mg daily x3 weeks  Dyslipidemia statin   Essential hypertension Resume home meds    Peripheral neuropathy Neurontin.   GERD without  esophagitis PPI   No concerns based on BMI: Body mass index is 21.62 kg/m.Aaron Aas Significantly low or high BMI is associated with higher medical risk.  Underweight - under 18  overweight - 25 to 29 obese - 30 or more Class 1 obesity: BMI of 30.0 to 34 Class 2 obesity: BMI of 35.0 to 39 Class 3 obesity: BMI of 40.0 to 49 Super Morbid Obesity: BMI 50-59 Super-super Morbid Obesity: BMI 60+ Healthy nutrition and physical activity advised as adjunct to other disease management and risk reduction treatments    DVT prophylaxis: IV heparin  IV fluids: no continuous IV fluids  Nutrition: cardiac diet  Central lines / other devices: none  Code Status: DNR ACP documentation reviewed:  none on file in [facility]  TOC needs: TBD Medical barriers to dispo: heparin  IV. Expected medical readiness for discharge tomorrow / per cardiology clearance.              Subjective / Brief ROS:  Patient reports no concerns at this time CP overnight but this resovled  Denies CP/SOB.  Pain controlled.  Denies new weakness.  Tolerating diet.  Reports no concerns w/ urination/defecation.   Family Communication: son and his wife at bedside on rounds     Objective Findings:  Vitals:   04/25/24 0400 04/25/24 0500 04/25/24 0600 04/25/24 0707  BP: (!) 148/76 (!) 137/96 (!) 140/71 (!) 151/63  Pulse: 62 72 61 63  Resp: (!) 21 15 (!) 24 20  Temp:    98.2 F (36.8 C)  TempSrc:      SpO2: 99% 97% 99% 98%  Weight:  Height:       No intake or output data in the 24 hours ending 04/25/24 1206 Filed Weights   04/24/24 0414  Weight: 51.9 kg    Examination:  Physical Exam Constitutional:      General: She is not in acute distress. Cardiovascular:     Rate and Rhythm: Normal rate and regular rhythm.  Pulmonary:     Effort: Pulmonary effort is normal.     Breath sounds: Normal breath sounds.  Skin:    General: Skin is warm and dry.  Neurological:     General: No focal deficit present.      Mental Status: She is alert. Mental status is at baseline.     Gait: Gait normal.  Psychiatric:        Mood and Affect: Mood normal.        Behavior: Behavior normal.          Scheduled Medications:    stroke: early stages of recovery book   Does not apply Once   [START ON 04/26/2024] aspirin  EC  81 mg Oral Daily   [START ON 04/26/2024] aspirin  EC  81 mg Oral Daily   carvedilol   3.125 mg Oral BID WC   clopidogrel   75 mg Oral Daily   isosorbide mononitrate  30 mg Oral Daily   losartan   100 mg Oral Daily   mirtazapine  7.5 mg Oral QHS   pantoprazole   40 mg Oral Daily   pravastatin   80 mg Oral QHS   rosuvastatin  20 mg Oral Daily   sodium chloride  flush  3 mL Intravenous Q12H    Continuous Infusions:  heparin  600 Units/hr (04/25/24 1021)    PRN Medications:  acetaminophen  **OR** acetaminophen , magnesium  hydroxide, meclizine, morphine  injection, nitroGLYCERIN , ondansetron  **OR** ondansetron  (ZOFRAN ) IV, traZODone   Antimicrobials from admission:  Anti-infectives (From admission, onward)    None           Data Reviewed:  I have personally reviewed the following...  CBC: Recent Labs  Lab 04/23/24 2318 04/25/24 0505  WBC 8.1  8.1 7.2  NEUTROABS 5.0  --   HGB 11.5*  11.4* 12.4  HCT 33.6*  33.7* 36.8  MCV 96.3  96.0 96.8  PLT 218  217 199   Basic Metabolic Panel: Recent Labs  Lab 04/23/24 0131 04/23/24 2318 04/25/24 0505  NA 140 139 141  K 4.8 4.2 3.9  CL 111 109 109  CO2 21* 22 22  GLUCOSE 103* 111* 100*  BUN 31* 33* 22  CREATININE 1.22* 1.27* 1.10*  CALCIUM  8.6* 8.7* 8.8*   GFR: Estimated Creatinine Clearance: 26.7 mL/min (A) (by C-G formula based on SCr of 1.1 mg/dL (H)). Liver Function Tests: Recent Labs  Lab 04/23/24 0131  AST 27  ALT 9  ALKPHOS 35*  BILITOT 1.5*  PROT 6.2*  ALBUMIN 3.4*   No results for input(s): "LIPASE", "AMYLASE" in the last 168 hours. No results for input(s): "AMMONIA" in the last 168 hours. Coagulation  Profile: Recent Labs  Lab 04/23/24 2318  INR 1.1   Cardiac Enzymes: No results for input(s): "CKTOTAL", "CKMB", "CKMBINDEX", "TROPONINI" in the last 168 hours. BNP (last 3 results) No results for input(s): "PROBNP" in the last 8760 hours. HbA1C: Recent Labs    04/24/24 0749  HGBA1C 4.9   CBG: No results for input(s): "GLUCAP" in the last 168 hours. Lipid Profile: Recent Labs    04/25/24 0505  CHOL 133  HDL 61  LDLCALC 55  TRIG 85  CHOLHDL  2.2   Thyroid Function Tests: No results for input(s): "TSH", "T4TOTAL", "FREET4", "T3FREE", "THYROIDAB" in the last 72 hours. Anemia Panel: No results for input(s): "VITAMINB12", "FOLATE", "FERRITIN", "TIBC", "IRON", "RETICCTPCT" in the last 72 hours. Most Recent Urinalysis On File:     Component Value Date/Time   COLORURINE STRAW (A) 04/24/2024 0019   APPEARANCEUR CLEAR (A) 04/24/2024 0019   APPEARANCEUR Clear 11/27/2014 1526   LABSPEC 1.009 04/24/2024 0019   LABSPEC 1.010 11/27/2014 1526   PHURINE 5.0 04/24/2024 0019   GLUCOSEU NEGATIVE 04/24/2024 0019   GLUCOSEU Negative 11/27/2014 1526   HGBUR SMALL (A) 04/24/2024 0019   BILIRUBINUR NEGATIVE 04/24/2024 0019   BILIRUBINUR Negative 11/27/2014 1526   KETONESUR NEGATIVE 04/24/2024 0019   PROTEINUR NEGATIVE 04/24/2024 0019   NITRITE NEGATIVE 04/24/2024 0019   LEUKOCYTESUR MODERATE (A) 04/24/2024 0019   LEUKOCYTESUR 1+ 11/27/2014 1526   Sepsis Labs: @LABRCNTIP (procalcitonin:4,lacticidven:4) Microbiology: No results found for this or any previous visit (from the past 240 hours).    Radiology Studies last 3 days: ECHOCARDIOGRAM COMPLETE BUBBLE STUDY Result Date: 04/24/2024    ECHOCARDIOGRAM REPORT   Patient Name:   DANIJA SEDBERRY Marrazzo Date of Exam: 04/24/2024 Medical Rec #:  [id number]      Height:       61.0 in Accession #:    [id number]     Weight:       114.4 lb Date of Birth:  1933/01/29      BSA:          1.490 m Patient Age:    89 years       BP:           150/70 mmHg Patient  Gender: F              HR:           64 bpm. Exam Location:  [facility] Procedure: 2D Echo, Cardiac Doppler, Color Doppler and Saline Contrast Bubble            Study (Both Spectral and Color Flow Doppler were utilized during            procedure). Indications:     TIA 435.9/ G45.9  History:         Patient has prior history of Echocardiogram examinations.  Sonographer:     Jane Meager RDCS Referring Phys:  [id number] JAN A MANSY Diagnosing Phys: Percival Brace MD  Sonographer Comments: Image acquisition challenging due to respiratory motion. IMPRESSIONS  1. Left ventricular ejection fraction, by estimation, is 60 to 65%. The left ventricle has normal function. The left ventricle has no regional wall motion abnormalities. Left ventricular diastolic parameters are consistent with Grade I diastolic dysfunction (impaired relaxation).  2. Right ventricular systolic function is normal. The right ventricular size is normal.  3. The mitral valve is normal in structure. Mild to moderate mitral valve regurgitation. No evidence of mitral stenosis.  4. Tricuspid valve regurgitation is mild to moderate.  5. The aortic valve is normal in structure. Aortic valve regurgitation is not visualized. No aortic stenosis is present.  6. The inferior vena cava is normal in size with greater than 50% respiratory variability, suggesting right atrial pressure of 3 mmHg.  7. Agitated saline contrast bubble study was negative, with no evidence of any interatrial shunt. FINDINGS  Left Ventricle: Left ventricular ejection fraction, by estimation, is 60 to 65%. The left ventricle has normal function. The left ventricle has no regional wall motion abnormalities. Strain was performed and the global  longitudinal strain is indeterminate. The left ventricular internal cavity size was normal in size. There is no left ventricular hypertrophy. Left ventricular diastolic parameters are consistent with Grade I diastolic dysfunction (impaired relaxation). Right  Ventricle: The right ventricular size is normal. No increase in right ventricular wall thickness. Right ventricular systolic function is normal. Left Atrium: Left atrial size was normal in size. Right Atrium: Right atrial size was normal in size. Pericardium: There is no evidence of pericardial effusion. Mitral Valve: The mitral valve is normal in structure. Mild to moderate mitral valve regurgitation. No evidence of mitral valve stenosis. Tricuspid Valve: The tricuspid valve is normal in structure. Tricuspid valve regurgitation is mild to moderate. No evidence of tricuspid stenosis. Aortic Valve: The aortic valve is normal in structure. Aortic valve regurgitation is not visualized. No aortic stenosis is present. Aortic valve peak gradient measures 6.6 mmHg. Pulmonic Valve: The pulmonic valve was normal in structure. Pulmonic valve regurgitation is not visualized. No evidence of pulmonic stenosis. Aorta: The aortic root is normal in size and structure. Venous: The inferior vena cava is normal in size with greater than 50% respiratory variability, suggesting right atrial pressure of 3 mmHg. IAS/Shunts: No atrial level shunt detected by color flow Doppler. Agitated saline contrast was given intravenously to evaluate for intracardiac shunting. Agitated saline contrast bubble study was negative, with no evidence of any interatrial shunt. Additional Comments: 3D was performed not requiring image post processing on an independent workstation and was indeterminate.  LEFT VENTRICLE PLAX 2D LVIDd:         3.90 cm   Diastology LVIDs:         2.60 cm   LV e' medial:    4.35 cm/s LV PW:         1.00 cm   LV E/e' medial:  19.9 LV IVS:        1.00 cm   LV e' lateral:   6.09 cm/s LVOT diam:     2.00 cm   LV E/e' lateral: 14.2 LV SV:         55 LV SV Index:   37 LVOT Area:     3.14 cm  RIGHT VENTRICLE RV Basal diam:  3.10 cm RV S prime:     12.60 cm/s TAPSE (M-mode): 2.2 cm LEFT ATRIUM             Index        RIGHT ATRIUM            Index LA diam:        2.50 cm 1.68 cm/m   RA Area:     13.00 cm LA Vol (A2C):   30.1 ml 20.21 ml/m  RA Volume:   31.10 ml  20.88 ml/m LA Vol (A4C):   33.4 ml 22.42 ml/m LA Biplane Vol: 31.8 ml 21.35 ml/m  AORTIC VALVE                 PULMONIC VALVE AV Area (Vmax): 1.93 cm     PV Vmax:        0.86 m/s AV Vmax:        128.00 cm/s  PV Peak grad:   3.0 mmHg AV Peak Grad:   6.6 mmHg     RVOT Peak grad: 2 mmHg LVOT Vmax:      78.60 cm/s LVOT Vmean:     51.900 cm/s LVOT VTI:       0.174 m  AORTA Ao Root diam: 3.30 cm Ao Asc  diam:  3.10 cm MITRAL VALVE                TRICUSPID VALVE MV Area (PHT): 3.12 cm     TR Peak grad:   24.8 mmHg MV Decel Time: 243 msec     TR Vmax:        249.00 cm/s MV E velocity: 86.50 cm/s MV A velocity: 105.00 cm/s  SHUNTS MV E/A ratio:  0.82         Systemic VTI:  0.17 m                             Systemic Diam: 2.00 cm Percival Brace MD Electronically signed by Percival Brace MD Signature Date/Time: 04/24/2024/12:18:32 PM    Final    MR BRAIN WO CONTRAST Result Date: 04/24/2024 CLINICAL DATA:  Transient ischemic attack (TIA). EXAM: MRI HEAD WITHOUT CONTRAST TECHNIQUE: Multiplanar, multiecho pulse sequences of the brain and surrounding structures were obtained without intravenous contrast. COMPARISON:  CT head Apr 23, 2024. FINDINGS: Brain: No acute infarction, hemorrhage, hydrocephalus, extra-axial collection or mass lesion. Mild for age scattered T2/FLAIR hyperintensities in the white matter, compatible with chronic microvascular ischemic change. Vascular: Major arterial flow voids are maintained at the skull base. Skull and upper cervical spine: Normal marrow signal. Sinuses/Orbits: Clear sinuses.  No acute orbital findings. Other: No mastoid effusions. IMPRESSION: No acute abnormality. Electronically Signed   By: Stevenson Elbe M.D.   On: 04/24/2024 03:56   CT Angio Head Neck W WO CM Result Date: 04/24/2024 CLINICAL DATA:  Transient ischemic attack (TIA) EXAM: CT  ANGIOGRAPHY HEAD AND NECK WITH AND WITHOUT CONTRAST TECHNIQUE: Multidetector CT imaging of the head and neck was performed using the standard protocol during bolus administration of intravenous contrast. Multiplanar CT image reconstructions and MIPs were obtained to evaluate the vascular anatomy. Carotid stenosis measurements (when applicable) are obtained utilizing NASCET criteria, using the distal internal carotid diameter as the denominator. RADIATION DOSE REDUCTION: This exam was performed according to the departmental dose-optimization program which includes automated exposure control, adjustment of the mA and/or kV according to patient size and/or use of iterative reconstruction technique. CONTRAST:  75mL OMNIPAQUE  IOHEXOL  350 MG/ML SOLN COMPARISON:  None Available. FINDINGS: CTA NECK FINDINGS Aortic arch: Aortic atherosclerosis. Great vessel origins are patent without significant stenosis. Right carotid system: No evidence of dissection, stenosis (50% or greater), or occlusion. Left carotid system: No evidence of dissection, stenosis (50% or greater), or occlusion. Vertebral arteries: Codominant. No evidence of dissection, stenosis (50% or greater), or occlusion. Skeleton: No evidence of acute abnormality on limited assessment. Limbus vertebrae at multiple levels. Other neck: No acute abnormality on limited assessment. Upper chest: No acute abnormality on limited assessment. Review of the MIP images confirms the above findings CTA HEAD FINDINGS Anterior circulation: Bilateral intracranial ICAs, MCAs, and ACAs are patent without proximal hemodynamically significant stenosis. Posterior circulation: Bilateral intradural vertebral arteries, basilar artery, and bilateral posterior cerebral arteries are patent without proximal hemodynamically significant stenosis. Venous sinuses: As permitted by contrast timing, patent. Review of the MIP images confirms the above findings IMPRESSION: No large vessel occlusion or  proximal hemodynamically significant stenosis. Electronically Signed   By: Stevenson Elbe M.D.   On: 04/24/2024 03:42   DG Chest Port 1 View Result Date: 04/24/2024 CLINICAL DATA:  Chest pain EXAM: PORTABLE CHEST 1 VIEW COMPARISON:  08/16/2023 FINDINGS: The heart size and mediastinal contours are within normal limits. Both lungs are  clear. The visualized skeletal structures are unremarkable. IMPRESSION: No active disease. Electronically Signed   By: Violeta Grey M.D.   On: 04/24/2024 00:01   CT HEAD CODE STROKE WO CONTRAST Result Date: 04/23/2024 CLINICAL DATA:  Code stroke.  Neuro deficit, acute, stroke suspected EXAM: CT HEAD WITHOUT CONTRAST TECHNIQUE: Contiguous axial images were obtained from the base of the skull through the vertex without intravenous contrast. RADIATION DOSE REDUCTION: This exam was performed according to the departmental dose-optimization program which includes automated exposure control, adjustment of the mA and/or kV according to patient size and/or use of iterative reconstruction technique. COMPARISON:  CT head 08/16/2023. FINDINGS: Brain: No evidence of acute infarction, hemorrhage, hydrocephalus, extra-axial collection or mass lesion/mass effect. Vascular: No hyperdense vessel. Skull: No acute fracture. Sinuses/Orbits: No acute finding. ASPECTS [facility] Stroke Program Early CT Score) Total score (0-10 with 10 being normal): 10. IMPRESSION: No evidence of acute intracranial abnormality. ASPECTS is 10. Code stroke imaging results were communicated on 04/23/2024 at 11:53 pm to provider Dr. Margery Sheets Via telephone, who verbally acknowledged these results. Electronically Signed   By: Stevenson Elbe M.D.   On: 04/23/2024 23:54           Jonay Hitchcock, DO [facility] 04/25/2024, 12:06 PM    Dictation software may have been used to generate the above note. Typos may occur and escape review in typed/dictated notes. Please contact Dr Authur Leghorn directly for clarity if  needed.  Staff may message me via secure chat in Epic  but this may not receive an immediate response,  please page me for urgent matters!  If 7PM-7AM, please contact night coverage [email or web address]

## 2024-04-25 NOTE — Progress Notes (Signed)
 Pt has AMS, AAO to person only. Pt repeatedly called son Sylvester Evert via hospital phone. Family called this RN at the nursing station and requested that pt room phone be removed from pt reach. Per family request, pt phone to be removed.

## 2024-04-25 NOTE — Progress Notes (Signed)
 [facility] CARDIOLOGY PROGRESS NOTE   Patient ID: Sandra Delgado MRN: [id number] DOB/AGE: [birth year] Delgado 89 y.o.  Admit date: 04/23/2024 Referring Physician Dr. Amalia Jung Primary Physician Jimmy Moulding, MD  Primary Cardiologist Dr. Braxton Calico Reason for Consultation NSTEMI  HPI: Sandra Delgado is a 89 y.o. female with a past medical history of hypertension, hyperlipidemia, and COPD who presented to the ED on 04/23/2024 for chest pain. Cardiology was consulted for further evaluation.   Interval History:  -Patient seen and examined this AM. Reports feeling ok overall.  -Denies CP this AM, states she had some burning last night.  -Wishes to pursue medical management for NSTEMI, she is confused as to why she is not being discharged this AM.   Review of systems complete and found to be negative unless listed above    Vitals:   04/25/24 0400 04/25/24 0500 04/25/24 0600 04/25/24 0707  BP: (!) 148/76 (!) 137/96 (!) 140/71 (!) 151/63  Pulse: 62 72 61 63  Resp: (!) 21 15 (!) 24 20  Temp:    98.2 F (36.8 C)  TempSrc:      SpO2: 99% 97% 99% 98%  Weight:      Height:        No intake or output data in the 24 hours ending 04/25/24 0852   PHYSICAL EXAM General: Well appearing elderly female, well nourished, in no acute distress. HEENT: Normocephalic and atraumatic. Neck: No JVD.  Lungs: Normal respiratory effort on room air. Clear bilaterally to auscultation. No wheezes, crackles, rhonchi.  Heart: HRRR. Normal S1 and S2 without gallops or murmurs. Radial & DP pulses 2+ bilaterally. Abdomen: Non-distended appearing.  Msk: Normal strength and tone for age. Extremities: No clubbing, cyanosis or edema.   Neuro: Alert and oriented X 3. Psych: Mood appropriate, affect congruent.    LABS: Basic Metabolic Panel: Recent Labs    04/23/24 2318 04/25/24 0505  NA 139 141  K 4.2 3.9  CL 109 109  CO2 22 22  GLUCOSE 111* 100*  BUN 33* 22  CREATININE 1.27* 1.10*  CALCIUM  8.7* 8.8*    Liver Function Tests: Recent Labs    04/23/24 0131  AST 27  ALT 9  ALKPHOS 35*  BILITOT 1.5*  PROT 6.2*  ALBUMIN 3.4*   No results for input(s): "LIPASE", "AMYLASE" in the last 72 hours. CBC: Recent Labs    04/23/24 2318 04/25/24 0505  WBC 8.1  8.1 7.2  NEUTROABS 5.0  --   HGB 11.5*  11.4* 12.4  HCT 33.6*  33.7* 36.8  MCV 96.3  96.0 96.8  PLT 218  217 199   Cardiac Enzymes: Recent Labs    04/24/24 0749 04/24/24 0926 04/24/24 2224  TROPONINIHS 395* 389* 688*   BNP: No results for input(s): "BNP" in the last 72 hours. D-Dimer: Recent Labs    04/23/24 2318  DDIMER <0.27   Hemoglobin A1C: Recent Labs    04/24/24 0749  HGBA1C 4.9   Fasting Lipid Panel: Recent Labs    04/25/24 0505  CHOL 133  HDL 61  LDLCALC 55  TRIG 85  CHOLHDL 2.2   Thyroid Function Tests: No results for input(s): "TSH", "T4TOTAL", "T3FREE", "THYROIDAB" in the last 72 hours.  Invalid input(s): "FREET3" Anemia Panel: No results for input(s): "VITAMINB12", "FOLATE", "FERRITIN", "TIBC", "IRON", "RETICCTPCT" in the last 72 hours.  ECHOCARDIOGRAM COMPLETE BUBBLE STUDY Result Date: 04/24/2024    ECHOCARDIOGRAM REPORT   Patient Name:   Sandra Delgado Sandra Delgado Date of Exam: 04/24/2024 Medical  Rec #:  [id number]      Height:       61.0 in Accession #:    [id number]     Weight:       114.4 lb Date of Birth:  Sandra Delgado      BSA:          1.490 m Patient Age:    89 years       BP:           150/70 mmHg Patient Gender: F              HR:           64 bpm. Exam Location:  [facility] Procedure: 2D Echo, Cardiac Doppler, Color Doppler and Saline Contrast Bubble            Study (Both Spectral and Color Flow Doppler were utilized during            procedure). Indications:     TIA 435.9/ G45.9  History:         Patient has prior history of Echocardiogram examinations.  Sonographer:     Jane Meager RDCS Referring Phys:  [id number] JAN A MANSY Diagnosing Phys: Percival Brace MD  Sonographer Comments: Image  acquisition challenging due to respiratory motion. IMPRESSIONS  1. Left ventricular ejection fraction, by estimation, is 60 to 65%. The left ventricle has normal function. The left ventricle has no regional wall motion abnormalities. Left ventricular diastolic parameters are consistent with Grade I diastolic dysfunction (impaired relaxation).  2. Right ventricular systolic function is normal. The right ventricular size is normal.  3. The mitral valve is normal in structure. Mild to moderate mitral valve regurgitation. No evidence of mitral stenosis.  4. Tricuspid valve regurgitation is mild to moderate.  5. The aortic valve is normal in structure. Aortic valve regurgitation is not visualized. No aortic stenosis is present.  6. The inferior vena cava is normal in size with greater than 50% respiratory variability, suggesting right atrial pressure of 3 mmHg.  7. Agitated saline contrast bubble study was negative, with no evidence of any interatrial shunt. FINDINGS  Left Ventricle: Left ventricular ejection fraction, by estimation, is 60 to 65%. The left ventricle has normal function. The left ventricle has no regional wall motion abnormalities. Strain was performed and the global longitudinal strain is indeterminate. The left ventricular internal cavity size was normal in size. There is no left ventricular hypertrophy. Left ventricular diastolic parameters are consistent with Grade I diastolic dysfunction (impaired relaxation). Right Ventricle: The right ventricular size is normal. No increase in right ventricular wall thickness. Right ventricular systolic function is normal. Left Atrium: Left atrial size was normal in size. Right Atrium: Right atrial size was normal in size. Pericardium: There is no evidence of pericardial effusion. Mitral Valve: The mitral valve is normal in structure. Mild to moderate mitral valve regurgitation. No evidence of mitral valve stenosis. Tricuspid Valve: The tricuspid valve is normal in  structure. Tricuspid valve regurgitation is mild to moderate. No evidence of tricuspid stenosis. Aortic Valve: The aortic valve is normal in structure. Aortic valve regurgitation is not visualized. No aortic stenosis is present. Aortic valve peak gradient measures 6.6 mmHg. Pulmonic Valve: The pulmonic valve was normal in structure. Pulmonic valve regurgitation is not visualized. No evidence of pulmonic stenosis. Aorta: The aortic root is normal in size and structure. Venous: The inferior vena cava is normal in size with greater than 50% respiratory variability, suggesting right atrial pressure of 3 mmHg. IAS/Shunts:  No atrial level shunt detected by color flow Doppler. Agitated saline contrast was given intravenously to evaluate for intracardiac shunting. Agitated saline contrast bubble study was negative, with no evidence of any interatrial shunt. Additional Comments: 3D was performed not requiring image post processing on an independent workstation and was indeterminate.  LEFT VENTRICLE PLAX 2D LVIDd:         3.90 cm   Diastology LVIDs:         2.60 cm   LV e' medial:    4.35 cm/s LV PW:         1.00 cm   LV E/e' medial:  19.9 LV IVS:        1.00 cm   LV e' lateral:   6.09 cm/s LVOT diam:     2.00 cm   LV E/e' lateral: 14.2 LV SV:         55 LV SV Index:   37 LVOT Area:     3.14 cm  RIGHT VENTRICLE RV Basal diam:  3.10 cm RV S prime:     12.60 cm/s TAPSE (M-mode): 2.2 cm LEFT ATRIUM             Index        RIGHT ATRIUM           Index LA diam:        2.50 cm 1.68 cm/m   RA Area:     13.00 cm LA Vol (A2C):   30.1 ml 20.21 ml/m  RA Volume:   31.10 ml  20.88 ml/m LA Vol (A4C):   33.4 ml 22.42 ml/m LA Biplane Vol: 31.8 ml 21.35 ml/m  AORTIC VALVE                 PULMONIC VALVE AV Area (Vmax): 1.93 cm     PV Vmax:        0.86 m/s AV Vmax:        128.00 cm/s  PV Peak grad:   3.0 mmHg AV Peak Grad:   6.6 mmHg     RVOT Peak grad: 2 mmHg LVOT Vmax:      78.60 cm/s LVOT Vmean:     51.900 cm/s LVOT VTI:       0.174  m  AORTA Ao Root diam: 3.30 cm Ao Asc diam:  3.10 cm MITRAL VALVE                TRICUSPID VALVE MV Area (PHT): 3.12 cm     TR Peak grad:   24.8 mmHg MV Decel Time: 243 msec     TR Vmax:        249.00 cm/s MV E velocity: 86.50 cm/s MV A velocity: 105.00 cm/s  SHUNTS MV E/A ratio:  0.82         Systemic VTI:  0.17 m                             Systemic Diam: 2.00 cm Percival Brace MD Electronically signed by Percival Brace MD Signature Date/Time: 04/24/2024/12:18:32 PM    Final    MR BRAIN WO CONTRAST Result Date: 04/24/2024 CLINICAL DATA:  Transient ischemic attack (TIA). EXAM: MRI HEAD WITHOUT CONTRAST TECHNIQUE: Multiplanar, multiecho pulse sequences of the brain and surrounding structures were obtained without intravenous contrast. COMPARISON:  CT head Apr 23, 2024. FINDINGS: Brain: No acute infarction, hemorrhage, hydrocephalus, extra-axial collection or mass lesion. Mild for age scattered T2/FLAIR hyperintensities in the  white matter, compatible with chronic microvascular ischemic change. Vascular: Major arterial flow voids are maintained at the skull base. Skull and upper cervical spine: Normal marrow signal. Sinuses/Orbits: Clear sinuses.  No acute orbital findings. Other: No mastoid effusions. IMPRESSION: No acute abnormality. Electronically Signed   By: Stevenson Elbe M.D.   On: 04/24/2024 03:56   CT Angio Head Neck W WO CM Result Date: 04/24/2024 CLINICAL DATA:  Transient ischemic attack (TIA) EXAM: CT ANGIOGRAPHY HEAD AND NECK WITH AND WITHOUT CONTRAST TECHNIQUE: Multidetector CT imaging of the head and neck was performed using the standard protocol during bolus administration of intravenous contrast. Multiplanar CT image reconstructions and MIPs were obtained to evaluate the vascular anatomy. Carotid stenosis measurements (when applicable) are obtained utilizing NASCET criteria, using the distal internal carotid diameter as the denominator. RADIATION DOSE REDUCTION: This exam was performed  according to the departmental dose-optimization program which includes automated exposure control, adjustment of the mA and/or kV according to patient size and/or use of iterative reconstruction technique. CONTRAST:  75mL OMNIPAQUE  IOHEXOL  350 MG/ML SOLN COMPARISON:  None Available. FINDINGS: CTA NECK FINDINGS Aortic arch: Aortic atherosclerosis. Great vessel origins are patent without significant stenosis. Right carotid system: No evidence of dissection, stenosis (50% or greater), or occlusion. Left carotid system: No evidence of dissection, stenosis (50% or greater), or occlusion. Vertebral arteries: Codominant. No evidence of dissection, stenosis (50% or greater), or occlusion. Skeleton: No evidence of acute abnormality on limited assessment. Limbus vertebrae at multiple levels. Other neck: No acute abnormality on limited assessment. Upper chest: No acute abnormality on limited assessment. Review of the MIP images confirms the above findings CTA HEAD FINDINGS Anterior circulation: Bilateral intracranial ICAs, MCAs, and ACAs are patent without proximal hemodynamically significant stenosis. Posterior circulation: Bilateral intradural vertebral arteries, basilar artery, and bilateral posterior cerebral arteries are patent without proximal hemodynamically significant stenosis. Venous sinuses: As permitted by contrast timing, patent. Review of the MIP images confirms the above findings IMPRESSION: No large vessel occlusion or proximal hemodynamically significant stenosis. Electronically Signed   By: Stevenson Elbe M.D.   On: 04/24/2024 03:42   DG Chest Port 1 View Result Date: 04/24/2024 CLINICAL DATA:  Chest pain EXAM: PORTABLE CHEST 1 VIEW COMPARISON:  08/16/2023 FINDINGS: The heart size and mediastinal contours are within normal limits. Both lungs are clear. The visualized skeletal structures are unremarkable. IMPRESSION: No active disease. Electronically Signed   By: Violeta Grey M.D.   On: 04/24/2024 00:01    CT HEAD CODE STROKE WO CONTRAST Result Date: 04/23/2024 CLINICAL DATA:  Code stroke.  Neuro deficit, acute, stroke suspected EXAM: CT HEAD WITHOUT CONTRAST TECHNIQUE: Contiguous axial images were obtained from the base of the skull through the vertex without intravenous contrast. RADIATION DOSE REDUCTION: This exam was performed according to the departmental dose-optimization program which includes automated exposure control, adjustment of the mA and/or kV according to patient size and/or use of iterative reconstruction technique. COMPARISON:  CT head 08/16/2023. FINDINGS: Brain: No evidence of acute infarction, hemorrhage, hydrocephalus, extra-axial collection or mass lesion/mass effect. Vascular: No hyperdense vessel. Skull: No acute fracture. Sinuses/Orbits: No acute finding. ASPECTS [facility] Stroke Program Early CT Score) Total score (0-10 with 10 being normal): 10. IMPRESSION: No evidence of acute intracranial abnormality. ASPECTS is 10. Code stroke imaging results were communicated on 04/23/2024 at 11:53 pm to provider Dr. Margery Sheets Via telephone, who verbally acknowledged these results. Electronically Signed   By: Stevenson Elbe M.D.   On: 04/23/2024 23:54     ECHO as  above  TELEMETRY reviewed by me 04/25/24: sinus rhythm rate 70s  EKG reviewed by me 04/25/24: Sinus rhythm with left bundle branch block at 67 bpm   DATA reviewed by me 04/25/24: last 24h vitals tele labs imaging I/O, hospitalist progress note  Principal Problem:   NSTEMI (non-ST elevated myocardial infarction) (HCC) Active Problems:   Dyslipidemia   GERD without esophagitis   Peripheral neuropathy   Essential hypertension   Vertigo    ASSESSMENT AND PLAN: Sandra Delgado is a 89 y.o. female with a past medical history of hypertension, hyperlipidemia, and COPD who presented to the ED on 04/23/2024 for chest pain. Cardiology was consulted for further evaluation.   # NSTEMI # Coronary artery disease # Chronic kidney disease  stage III Patient with hx of CAD with last cath in 2018 presented with complaints of chest pain. Troponins 27 > 113 > 395 > 389 > 688. EKG with NSR and LBBB which is chronic.  -Per patient after discussion she prefers to pursue medical management of her NSTEMI. States she has had multiple caths in the past and would not like to pursue this at this time.  -Continue IV heparin  x48 hours (initiated at 0441 on 04/24/24). -Change aspirin  to 81 mg daily. Continue crestor 20 mg daily.  -Start imdur 30 mg daily for antianginal management. Uptitrate as able.   This patient's case was discussed and created with Dr. Bob Burn and he is in agreement.  Signed:  Hamp Levine, PA-C  04/25/2024, 8:52 AM [facility] Card[facility]

## 2024-04-25 NOTE — Evaluation (Signed)
 Physical Therapy Evaluation Patient Details Name: Sandra Delgado MRN: [id number] DOB: 1933-09-02 Today's Date: 04/25/2024  History of Present Illness  89 y.o. female with medical history significant for COPD, dyslipidemia, hypertension, IBS and stage III CKD, who presented to the emergency room 05/03 with acute onset of chest heaviness and burning graded 8/10 in severity,  dizziness and lightheadedness with vertigo that started around 9 PM 05/03. Admitted for NSTEMI, pt wishing for medical management.  Clinical Impression  Pt agreeable to PT assessment, is able to AMB around unit fully without use of hands, but moves slowly, is rigid and less confident than baseline level. Pt agrees that a RW at home upon DC would be wise, one already waiting for her there. Family is pleased with her current mobility level, will plan to support at DC. No CP, SOB while up, HR WNL per tele monitor. Will follow.       If plan is discharge home, recommend the following: Direct supervision/assist for medications management;Supervision due to cognitive status;Direct supervision/assist for financial management;Assist for transportation;Assistance with cooking/housework;Help with stairs or ramp for entrance;A little help with walking and/or transfers   Can travel by private vehicle        Equipment Recommendations None recommended by PT  Recommendations for Other Services       Functional Status Assessment Patient has had a recent decline in their functional status and demonstrates the ability to make significant improvements in function in a reasonable and predictable amount of time.     Precautions / Restrictions Precautions Precautions: Fall Restrictions Weight Bearing Restrictions Per Provider Order: No      Mobility  Bed Mobility Overal bed mobility: Needs Assistance Bed Mobility: Supine to Sit     Supine to sit: Supervision, HOB elevated     General bed mobility comments: labored, successful     Transfers Overall transfer level: Needs assistance Equipment used: None Transfers: Sit to/from Stand Sit to Stand: Contact guard assist                Ambulation/Gait   Gait Distance (Feet): 240 Feet Assistive device: None Gait Pattern/deviations: Step-to pattern     Pre-gait activities: 0.64m/s General Gait Details: trial AMB at baseline level of assistance, no device, no assist (pt feels less confidence, is slower, would elect to use RW if at home)  Stairs            Wheelchair Mobility     Tilt Bed    Modified Rankin (Stroke Patients Only)       Balance                                             Pertinent Vitals/Pain Pain Assessment Pain Assessment: No/denies pain    Home Living Family/patient expects to be discharged to:: Private residence Living Arrangements: Alone Available Help at Discharge: Available PRN/intermittently;Family Type of Home: House Home Access: Stairs to enter   [employer] of Steps: 2   Home Layout: One level Home Equipment: Agricultural consultant (2 wheels)      Prior Function Prior Level of Function : Independent/Modified Independent;Driving             Mobility Comments: last fall in Aug '24, no AD ADLs Comments: indep, drives     Extremity/Trunk Assessment  Communication        Cognition Arousal: Alert Behavior During Therapy: WFL for tasks assessed/performed   PT - Cognitive impairments: No apparent impairments                                 Cueing       General Comments      Exercises     Assessment/Plan    PT Assessment Patient needs continued PT services  PT Problem List Decreased strength;Decreased activity tolerance;Decreased balance;Decreased mobility       PT Treatment Interventions DME instruction;Gait training;Stair training;Functional mobility training;Therapeutic activities;Therapeutic exercise;Balance training    PT  Goals (Current goals can be found in the Care Plan section)  Acute Rehab PT Goals Patient Stated Goal: return to home, regain strength and independence PT Goal Formulation: With patient Time For Goal Achievement: 05/09/24    Frequency Min 3X/week     Co-evaluation               AM-PAC PT "6 Clicks" Mobility  Outcome Measure Help needed turning from your back to your side while in a flat bed without using bedrails?: A Little Help needed moving from lying on your back to sitting on the side of a flat bed without using bedrails?: A Little Help needed moving to and from a bed to a chair (including a wheelchair)?: A Little Help needed standing up from a chair using your arms (e.g., wheelchair or bedside chair)?: A Little Help needed to walk in hospital room?: A Little Help needed climbing 3-5 steps with a railing? : A Little 6 Click Score: 18    End of Session Equipment Utilized During Treatment: Gait belt Activity Tolerance: Patient tolerated treatment well;No increased pain;Patient limited by fatigue Patient left: in chair;with family/visitor present;with call bell/phone within reach;with chair alarm set Nurse Communication: Mobility status PT Visit Diagnosis: Difficulty in walking, not elsewhere classified (R26.2);Unsteadiness on feet (R26.81)    Time: 4098-1191 PT Time Calculation (min) (ACUTE ONLY): 22 min   Charges:   PT Evaluation $PT Eval Moderate Complexity: 1 Mod PT Treatments $Therapeutic Activity: 8-22 mins PT General Charges $$ ACUTE PT VISIT: 1 Visit        5:10 PM, 04/25/24 Dawn Eth, PT, DPT Physical Therapist - [facility]  [phone] (ASCOM)    Karryn Kosinski C 04/25/2024, 5:08 PM

## 2024-04-25 NOTE — Progress Notes (Signed)
 ANTICOAGULATION CONSULT NOTE  Pharmacy Consult for heparin  infusion Indication: ACS/STEMI  Allergies  Allergen Reactions   Ace Inhibitors Other (See Comments)    Reaction: unknown   Amoxicillin Diarrhea    Has patient had a PCN reaction causing immediate rash, facial/tongue/throat swelling, SOB or lightheadedness with hypotension: No Has patient had a PCN reaction causing severe rash involving mucus membranes or skin necrosis: No Has patient had a PCN reaction that required hospitalization No Has patient had a PCN reaction occurring within the last 10 years: Yes If all of the above answers are "NO", then may proceed with Cephalosporin use.    Atorvastatin  Other (See Comments)    Reaction: muscle pain   Ezetimibe Other (See Comments)    Reaction: fatigue   Niacin And Related Other (See Comments)    Reaction: flushing   Patient Measurements: Height: 5\' 1"  (154.9 cm) Weight: 51.9 kg (114 lb 6.4 oz) IBW/kg (Calculated) : 47.8 Heparin  Dosing Weight: 51.9 kg  Vital Signs: Temp: 98 F (36.7 C) (05/04 1718) Temp Source: Oral (05/04 1718) BP: 132/54 (05/04 2330) Pulse Rate: 61 (05/04 2330)  Labs: Recent Labs    04/23/24 0131 04/23/24 2318 04/24/24 0131 04/24/24 0749 04/24/24 0926 04/24/24 1450 04/24/24 2224 04/25/24 0034  HGB  --  11.5*  11.4*  --   --   --   --   --   --   HCT  --  33.6*  33.7*  --   --   --   --   --   --   PLT  --  218  217  --   --   --   --   --   --   APTT  --  25  --   --   --   --   --   --   LABPROT  --  14.4  --   --   --   --   --   --   INR  --  1.1  --   --   --   --   --   --   HEPARINUNFRC  --   --   --   --   --  0.85*  --  0.59  CREATININE 1.22* 1.27*  --   --   --   --   --   --   TROPONINIHS  --  27*   < > 395* 389*  --  688*  --    < > = values in this interval not displayed.    Estimated Creatinine Clearance: 23.1 mL/min (A) (by C-G formula based on SCr of 1.27 mg/dL (H)).   Medical History: Past Medical History:   Diagnosis Date   Arthritis    CKD (chronic kidney disease) stage 3, GFR 30-59 ml/min (HCC)    COPD (chronic obstructive pulmonary disease) (HCC)    HLD (hyperlipidemia)    HTN (hypertension)    IBS (irritable bowel syndrome)    Lymphoma (HCC)    Migraine     Assessment: Pt is a 89 yo female presenting to ED c/o dizziness, weakness, and confusion found with elevated Troponin I level, trending up  Date Time HL Rate/Comment 5/4 1450 0.85 SUPRAtherapeutic 5/5 0034 0.59 Therapeutic x 1  Goal of Therapy:  Heparin  level 0.3-0.7 units/ml Monitor platelets by anticoagulation protocol: Yes   Plan:  Continue heparin  infusion rate at 600 units/hour Check heparin  level 8 hours to confirm Monitor CBC and signs/symptoms of bleeding  Thank you for involving pharmacy in this patient's care.   Coretta Dexter, PharmD, MBA 04/25/2024 1:31 AM

## 2024-04-25 NOTE — Evaluation (Signed)
 Occupational Therapy Evaluation Patient Details Name: Sandra Delgado MRN: [id number] DOB: 1933-01-11 Today's Date: 04/25/2024   History of Present Illness   89 y.o. female with medical history significant for COPD, dyslipidemia, hypertension, IBS and stage III CKD, who presented to the emergency room 05/03 with acute onset of chest heaviness and burning graded 8/10 in severity,  dizziness and lightheadedness with vertigo that started around 9 PM 05/03. Admitted for NSTEMI, pt wishing for medical management.   Clinical Impressions Pt was seen for OT evaluation this date. Prior to hospital admission, pt reports being independent, living alone, and denies AD use for mobility. She does endorse furniture walking and denies need for assist with ADL, still driving. Pt alert, oriented to self and month, reports the hospital as being a "hobby place" and unclear as to why she is here. Pt expresses frustration and confusion as to the plan of care. Active listening and reassurance provided to pt and family, educated in role of OT and per pt/family request sent MD message requesting to get an update on plan of care. Pt presents to acute OT demonstrating impaired ADL performance and functional mobility 2/2 impaired cognition (family notes this is acute), strength, balance, and safety (See OT problem list for additional functional deficits). Pt currently requires SBA/CGA for ADL mobility, toileting, and grooming at sink. VC for RW use and safety while ambulating in the room and bathroom. Pt would benefit from skilled OT services to address noted impairments and functional limitations (see below for any additional details) in order to maximize safety and independence while minimizing falls risk and caregiver burden.   If plan is discharge home, recommend the following:   A little help with walking and/or transfers;A little help with bathing/dressing/bathroom;Assistance with cooking/housework;Assist for  transportation;Help with stairs or ramp for entrance;Direct supervision/assist for medications management;Supervision due to cognitive status     Functional Status Assessment   Patient has had a recent decline in their functional status and demonstrates the ability to make significant improvements in function in a reasonable and predictable amount of time.    Equipment Recommendations  None recommended by OT    Precautions/Restrictions   Precautions Precautions: Fall Recall of Precautions/Restrictions: Impaired Restrictions Weight Bearing Restrictions Per Provider Order: No     Mobility Bed Mobility Overal bed mobility: Needs Assistance Bed Mobility: Supine to Sit     Supine to sit: Modified independent (Device/Increase time)     General bed mobility comments: increased effort/time    Transfers Overall transfer level: Needs assistance Equipment used: Rolling walker (2 wheels) Transfers: Sit to/from Stand Sit to Stand: Contact guard assist, Supervision   General transfer comment: SBA, VC for hand placement with RW      Balance Overall balance assessment: Needs assistance Sitting-balance support: No upper extremity supported, Feet supported Sitting balance-Leahy Scale: Good     Standing balance support: Single extremity supported, Reliant on assistive device for balance, During functional activity, No upper extremity supported, Bilateral upper extremity supported Standing balance-Leahy Scale: Fair Standing balance comment: fair static at sink to wash hands, UE support while completing pericare, and BUE support versus UE support with mobility        ADL either performed or assessed with clinical judgement   ADL Overall ADL's : Needs assistance/impaired     Grooming: Standing;Supervision/safety;Wash/dry hands Grooming Details (indicate cue type and reason): VC to get close to the sink for safety/balance          Toilet Transfer:  Ambulation;Supervision/safety;Rolling walker (2  wheels);BSC/3in1;Regular Teacher, adult education Details (indicate cue type and reason): BSC over the toilet Toileting- Clothing Manipulation and Hygiene: Sit to/from stand;Supervision/safety Toileting - Clothing Manipulation Details (indicate cue type and reason): SBA     Functional mobility during ADLs: Contact guard assist;Supervision/safety;Rolling walker (2 wheels);Cueing for safety        Pertinent Vitals/Pain Pain Assessment Pain Assessment: No/denies pain     Extremity/Trunk Assessment Upper Extremity Assessment Upper Extremity Assessment: Generalized weakness   Lower Extremity Assessment Lower Extremity Assessment: Generalized weakness       Communication Communication Communication: Impaired Factors Affecting Communication: Hearing impaired   Cognition Arousal: Alert Behavior During Therapy: Anxious Cognition: Cognition impaired   Orientation impairments: Place, Time, Situation Awareness: Online awareness impaired, Intellectual awareness impaired Memory impairment (select all impairments): Short-term memory, Working Civil Service fast streamer, Armed forces training and education officer functioning impairment (select all impairments): Reasoning, Problem solving OT - Cognition Comments: pt appears somewhat anxious, wary of care plan and staff, endorses seeing a man sitting in the corner that makes her "leery" and initially recalls cardiology in the room earlier this morning but later forgets.      Following commands: Intact       Cueing  General Comments   Cueing Techniques: Verbal cues      Exercises Other Exercises Other Exercises: Active listening and reassurance provided to pt and family, educated in role of OT and per pt/family request sent MD message requesting to get an update on plan of care   Shoulder Instructions      Home Living Family/patient expects to be discharged to:: Private residence Living Arrangements:  Alone Available Help at Discharge: Family;Available PRN/intermittently Type of Home: House Home Access: Stairs to enter [employer] of Steps: 2 Entrance Stairs-Rails: Right Home Layout: One level     Bathroom Shower/Tub: Chief Strategy Officer: Handicapped height     Home Equipment: Agricultural consultant (2 wheels)          Prior Functioning/Environment Prior Level of Function : Independent/Modified Independent;Driving    Mobility Comments: last fall in Aug '24, no AD ADLs Comments: indep, drives    OT Problem List: Decreased strength;Decreased safety awareness;Decreased cognition;Decreased activity tolerance;Impaired balance (sitting and/or standing);Decreased knowledge of use of DME or AE   OT Treatment/Interventions: Self-care/ADL training;Therapeutic exercise;Therapeutic activities;Cognitive remediation/compensation;DME and/or AE instruction;Patient/family education;Balance training      OT Goals(Current goals can be found in the care plan section)   Acute Rehab OT Goals Patient Stated Goal: go home OT Goal Formulation: With patient/family Time For Goal Achievement: 05/09/24 Potential to Achieve Goals: Good   OT Frequency:  Min 2X/week       AM-PAC OT "6 Clicks" Daily Activity     Outcome Measure Help from another person eating meals?: None Help from another person taking care of personal grooming?: A Little Help from another person toileting, which includes using toliet, bedpan, or urinal?: A Little Help from another person bathing (including washing, rinsing, drying)?: A Little Help from another person to put on and taking off regular upper body clothing?: None Help from another person to put on and taking off regular lower body clothing?: A Little 6 Click Score: 20   End of Session Equipment Utilized During Treatment: Rolling walker (2 wheels) Nurse Communication: Mobility status  Activity Tolerance: Patient tolerated treatment  well Patient left: in chair;with call bell/phone within reach;with family/visitor present  OT Visit Diagnosis: Other abnormalities of gait and mobility (R26.89);Muscle weakness (generalized) (M62.81);Other symptoms and signs involving cognitive  function;History of falling (Z91.81)                Time: 1610-9604 OT Time Calculation (min): 38 min Charges:  OT General Charges $OT Visit: 1 Visit OT Evaluation $OT Eval Moderate Complexity: 1 Mod OT Treatments $Self Care/Home Management : 23-37 mins  Berenda Breaker., MPH, MS, OTR/L ascom [phone] 04/25/24, 10:28 AM

## 2024-04-25 NOTE — ED Notes (Signed)
 WRITER RECEIVED CALL FROM [facility]/911 REPORTING THAT PT CALLING AND STATING SHE IS AT HER HOME AND THAT SOMEONE IS TRYING TO "GET HER." WRITER CALLED 2A CHARGE, SPOKE WITH MERICAR RN AND INFORMED OF SITUATION. [facility]COM MADE AWARE SITUATION KNOWN AND STAFF WITH PT AT THIS TIME.

## 2024-04-25 NOTE — Progress Notes (Signed)
 ANTICOAGULATION CONSULT NOTE  Pharmacy Consult for heparin  infusion Indication: ACS/STEMI  Allergies  Allergen Reactions   Ace Inhibitors Other (See Comments)    Reaction: unknown   Amoxicillin Diarrhea    Has patient had a PCN reaction causing immediate rash, facial/tongue/throat swelling, SOB or lightheadedness with hypotension: No Has patient had a PCN reaction causing severe rash involving mucus membranes or skin necrosis: No Has patient had a PCN reaction that required hospitalization No Has patient had a PCN reaction occurring within the last 10 years: Yes If all of the above answers are "NO", then may proceed with Cephalosporin use.    Atorvastatin  Other (See Comments)    Reaction: muscle pain   Ezetimibe Other (See Comments)    Reaction: fatigue   Niacin And Related Other (See Comments)    Reaction: flushing   Patient Measurements: Height: 5\' 1"  (154.9 cm) Weight: 51.9 kg (114 lb 6.4 oz) IBW/kg (Calculated) : 47.8 Heparin  Dosing Weight: 51.9 kg  Vital Signs: Temp: 98.2 F (36.8 C) (05/05 0707) BP: 151/63 (05/05 0707) Pulse Rate: 63 (05/05 0707)  Labs: Recent Labs    04/23/24 0131 04/23/24 2318 04/24/24 0131 04/24/24 0749 04/24/24 0926 04/24/24 1450 04/24/24 2224 04/25/24 0034 04/25/24 0505  HGB  --  11.5*  11.4*  --   --   --   --   --   --  12.4  HCT  --  33.6*  33.7*  --   --   --   --   --   --  36.8  PLT  --  218  217  --   --   --   --   --   --  199  APTT  --  25  --   --   --   --   --   --   --   LABPROT  --  14.4  --   --   --   --   --   --   --   INR  --  1.1  --   --   --   --   --   --   --   HEPARINUNFRC  --   --   --   --   --  0.85*  --  0.59  --   CREATININE 1.22* 1.27*  --   --   --   --   --   --  1.10*  TROPONINIHS  --  27*   < > 395* 389*  --  688*  --   --    < > = values in this interval not displayed.    Estimated Creatinine Clearance: 26.7 mL/min (A) (by C-G formula based on SCr of 1.1 mg/dL (H)).   Medical  History: Past Medical History:  Diagnosis Date   Arthritis    CKD (chronic kidney disease) stage 3, GFR 30-59 ml/min (HCC)    COPD (chronic obstructive pulmonary disease) (HCC)    HLD (hyperlipidemia)    HTN (hypertension)    IBS (irritable bowel syndrome)    Lymphoma (HCC)    Migraine     Assessment: Pt is a 89 yo female with hx of hypertension, hyperlipidemia, CKD stage III, IBS, lymphoma and COPD presenting to ED c/o dizziness, weakness, and confusion with recent hospitalization due to NSTEMI 07/2023. Pt complained of chest pain (8 out of 10) upon admission with resolution of chest pain after started heparin  drip, but found to have repeat burning sensation  late evening 5/4. Troponin I levels trending up from 27 >> 113 >> 395 >> 389 >> 688. CT head and brain MRI are negative. EKG showed normal sinus rhythm and LBBB. Cardiology is following, plan for left cardiac cath on 5/5.  04/25/24 CBC: PLT 199 (baseline), H & H 12.4/36.8 (baseline)  Date Time HL Rate/Comment 5/4 1450 0.85 SUPRAtherapeutic 5/5 0034 0.59 Therapeutic x 1 5/5 0906 0.58 Therapeutic x 2  Goal of Therapy:  Heparin  level 0.3-0.7 units/ml Monitor platelets by anticoagulation protocol: Yes   Plan:  -Continue heparin  infusion rate at 600 units/hour -Check heparin  level in the AM (therapeutic x 2) -Monitor CBC and signs/symptoms of bleeding  Thank you for involving pharmacy in this patient's care.   Ara Knee, PharmD Candidate 04/25/2024 7:59 AM

## 2024-04-25 NOTE — ED Notes (Signed)
 Repeat heparin  level obtained. Sent to lab.

## 2024-04-26 DIAGNOSIS — I214 Non-ST elevation (NSTEMI) myocardial infarction: Secondary | ICD-10-CM | POA: Diagnosis not present

## 2024-04-26 LAB — CBC
HCT: 34 % — ABNORMAL LOW (ref 36.0–46.0)
Hemoglobin: 11.6 g/dL — ABNORMAL LOW (ref 12.0–15.0)
MCH: 32.4 pg (ref 26.0–34.0)
MCHC: 34.1 g/dL (ref 30.0–36.0)
MCV: 95 fL (ref 80.0–100.0)
Platelets: 202 10*3/uL (ref 150–400)
RBC: 3.58 MIL/uL — ABNORMAL LOW (ref 3.87–5.11)
RDW: 12.3 % (ref 11.5–15.5)
WBC: 8.2 10*3/uL (ref 4.0–10.5)
nRBC: 0 % (ref 0.0–0.2)

## 2024-04-26 LAB — BASIC METABOLIC PANEL WITH GFR
Anion gap: 10 (ref 5–15)
BUN: 23 mg/dL (ref 8–23)
CO2: 21 mmol/L — ABNORMAL LOW (ref 22–32)
Calcium: 8.8 mg/dL — ABNORMAL LOW (ref 8.9–10.3)
Chloride: 105 mmol/L (ref 98–111)
Creatinine, Ser: 1.09 mg/dL — ABNORMAL HIGH (ref 0.44–1.00)
GFR, Estimated: 49 mL/min — ABNORMAL LOW (ref >60)
Glucose, Bld: 116 mg/dL — ABNORMAL HIGH (ref 70–99)
Potassium: 3.8 mmol/L (ref 3.5–5.1)
Sodium: 136 mmol/L (ref 135–145)

## 2024-04-26 LAB — HEPARIN LEVEL (UNFRACTIONATED): Heparin Unfractionated: 0.64 [IU]/mL (ref 0.30–0.70)

## 2024-04-26 LAB — TROPONIN I (HIGH SENSITIVITY): Troponin I (High Sensitivity): 222 ng/L (ref <18)

## 2024-04-26 MED ORDER — ROSUVASTATIN CALCIUM 20 MG PO TABS: 20.0000 mg | ORAL_TABLET | Freq: Every day | ORAL | 0 refills | Status: AC

## 2024-04-26 MED ORDER — NITROGLYCERIN 0.4 MG SL SUBL: 0.4000 mg | SUBLINGUAL_TABLET | SUBLINGUAL | 0 refills | Status: AC | PRN

## 2024-04-26 MED ORDER — ENOXAPARIN SODIUM 30 MG/0.3ML IJ SOSY: 30.0000 mg | PREFILLED_SYRINGE | INTRAMUSCULAR | Status: DC

## 2024-04-26 MED ORDER — ISOSORBIDE MONONITRATE ER 30 MG PO TB24: 30.0000 mg | ORAL_TABLET | Freq: Every day | ORAL | 0 refills | Status: AC

## 2024-04-26 MED ORDER — LORAZEPAM 2 MG/ML IJ SOLN: 0.5000 mg | Freq: Once | INTRAMUSCULAR | Status: DC

## 2024-04-26 NOTE — Progress Notes (Signed)
 Physical Therapy Treatment Patient Details Name: Sandra Delgado MRN: [id number] DOB: Feb 09, 1933 Today's Date: 04/26/2024   History of Present Illness 89 y.o. female with medical history significant for COPD, dyslipidemia, hypertension, IBS and stage III CKD, who presented to the emergency room 05/03 with acute onset of chest heaviness and burning graded 8/10 in severity,  dizziness and lightheadedness with vertigo that started around 9 PM 05/03. Admitted for NSTEMI, pt wishing for medical management.    PT Comments  Pt still moving well compared ot previous day, similar performance overall, but remains slightly off baseline. Son still visiting, reports no majors changes since previous day. Pt AMB same distance today, but offered a RW to determine pros and cons of use. RW management does not come naturally, requires some effort to maintain a straight LOP, but also to navigate around obstacles. Pt set up in recliner at end of session in preparation for meal. Will continue to follow.    If plan is discharge home, recommend the following: Direct supervision/assist for medications management;Supervision due to cognitive status;Direct supervision/assist for financial management;Assist for transportation;Assistance with cooking/housework;Help with stairs or ramp for entrance;A little help with walking and/or transfers   Can travel by private vehicle        Equipment Recommendations  None recommended by PT    Recommendations for Other Services       Precautions / Restrictions Precautions Precautions: Fall Recall of Precautions/Restrictions: Impaired Restrictions Weight Bearing Restrictions Per Provider Order: No     Mobility  Bed Mobility Overal bed mobility: Needs Assistance Bed Mobility: Sit to Supine       Sit to supine: Supervision   General bed mobility comments: labored, successful    Transfers Overall transfer level: Needs assistance Equipment used: Rolling walker (2  wheels) Transfers: Sit to/from Stand Sit to Stand: Contact guard assist                Ambulation/Gait Ambulation/Gait assistance: Contact guard assist, Supervision Gait Distance (Feet): 240 Feet Assistive device: Rolling walker (2 wheels) Gait Pattern/deviations: Step-through pattern, Step-to pattern Gait velocity: 0.36m/s today c RW; 0.20m/s yesterday     General Gait Details: today with RW; still quite slow compared to previous day without RW use; pt has difficulty navigating around obstacles in hallway, elects to keep a tight and narrow LOP around objects   Stairs             Wheelchair Mobility     Tilt Bed    Modified Rankin (Stroke Patients Only)       Balance                                            Communication    Cognition Arousal: Alert Behavior During Therapy: WFL for tasks assessed/performed   PT - Cognitive impairments: History of cognitive impairments                       PT - Cognition Comments: little more confused than baseline, but similat to previous day        Cueing    Exercises      General Comments        Pertinent Vitals/Pain Pain Assessment Pain Assessment: No/denies pain    Home Living  Prior Function            PT Goals (current goals can now be found in the care plan section) Acute Rehab PT Goals Patient Stated Goal: return to home, regain strength and independence PT Goal Formulation: With patient Time For Goal Achievement: 05/09/24 Progress towards PT goals: Progressing toward goals    Frequency    Min 3X/week      PT Plan      Co-evaluation              AM-PAC PT "6 Clicks" Mobility   Outcome Measure  Help needed turning from your back to your side while in a flat bed without using bedrails?: A Little Help needed moving from lying on your back to sitting on the side of a flat bed without using bedrails?: A Little Help  needed moving to and from a bed to a chair (including a wheelchair)?: A Little Help needed standing up from a chair using your arms (e.g., wheelchair or bedside chair)?: A Little Help needed to walk in hospital room?: A Little Help needed climbing 3-5 steps with a railing? : A Little 6 Click Score: 18    End of Session Equipment Utilized During Treatment: Gait belt Activity Tolerance: Patient tolerated treatment well;No increased pain;Patient limited by fatigue Patient left: in chair;with family/visitor present;with call bell/phone within reach;with chair alarm set Nurse Communication: Mobility status PT Visit Diagnosis: Difficulty in walking, not elsewhere classified (R26.2);Unsteadiness on feet (R26.81)     Time: 7846-9629 PT Time Calculation (min) (ACUTE ONLY): 19 min  Charges:    $Therapeutic Activity: 8-22 mins PT General Charges $$ ACUTE PT VISIT: 1 Visit                    1:48 PM, 04/26/24 Dawn Eth, PT, DPT Physical Therapist - [facility]  [phone] (ASCOM)     Cassidy Tabet C 04/26/2024, 1:45 PM

## 2024-04-26 NOTE — Progress Notes (Signed)
 ANTICOAGULATION CONSULT NOTE  Pharmacy Consult for heparin  infusion Indication: ACS/STEMI  Allergies  Allergen Reactions   Ace Inhibitors Other (See Comments)    Reaction: unknown   Amoxicillin Diarrhea    Has patient had a PCN reaction causing immediate rash, facial/tongue/throat swelling, SOB or lightheadedness with hypotension: No Has patient had a PCN reaction causing severe rash involving mucus membranes or skin necrosis: No Has patient had a PCN reaction that required hospitalization No Has patient had a PCN reaction occurring within the last 10 years: Yes If all of the above answers are "NO", then may proceed with Cephalosporin use.    Atorvastatin  Other (See Comments)    Reaction: muscle pain   Ezetimibe Other (See Comments)    Reaction: fatigue   Niacin And Related Other (See Comments)    Reaction: flushing   Patient Measurements: Height: 5\' 1"  (154.9 cm) Weight: 51.9 kg (114 lb 6.4 oz) IBW/kg (Calculated) : 47.8 Heparin  Dosing Weight: 51.9 kg  Vital Signs: Temp: 98.4 F (36.9 C) (05/06 0442) BP: 148/84 (05/06 0442) Pulse Rate: 73 (05/06 0442)  Labs: Recent Labs    04/23/24 2318 04/24/24 0131 04/24/24 0749 04/24/24 0926 04/24/24 1450 04/24/24 2224 04/25/24 0034 04/25/24 0505 04/25/24 0906 04/26/24 0631  HGB 11.5*  11.4*  --   --   --   --   --   --  12.4  --  11.6*  HCT 33.6*  33.7*  --   --   --   --   --   --  36.8  --  34.0*  PLT 218  217  --   --   --   --   --   --  199  --  202  APTT 25  --   --   --   --   --   --   --   --   --   LABPROT 14.4  --   --   --   --   --   --   --   --   --   INR 1.1  --   --   --   --   --   --   --   --   --   HEPARINUNFRC  --   --   --   --    < >  --  0.59  --  0.58 0.64  CREATININE 1.27*  --   --   --   --   --   --  1.10*  --   --   TROPONINIHS 27*   < > 395* 389*  --  688*  --   --   --   --    < > = values in this interval not displayed.    Estimated Creatinine Clearance: 26.7 mL/min (A) (by C-G  formula based on SCr of 1.1 mg/dL (H)).   Medical History: Past Medical History:  Diagnosis Date   Arthritis    CKD (chronic kidney disease) stage 3, GFR 30-59 ml/min (HCC)    COPD (chronic obstructive pulmonary disease) (HCC)    HLD (hyperlipidemia)    HTN (hypertension)    IBS (irritable bowel syndrome)    Lymphoma (HCC)    Migraine     Assessment: Pt is a 89 yo female with hx of hypertension, hyperlipidemia, CKD stage III, IBS, lymphoma and COPD presenting to ED c/o dizziness, weakness, and confusion with recent hospitalization due to NSTEMI 07/2023. Pt complained of  chest pain (8 out of 10) upon admission with resolution of chest pain after started heparin  drip, but found to have repeat burning sensation late evening 5/4. Troponin I levels trending up from 27 >> 113 >> 395 >> 389 >> 688. CT head and brain MRI are negative. EKG showed normal sinus rhythm and LBBB. Cardiology is following, plan for left cardiac cath on 5/5.  04/25/24 CBC: PLT 199 (baseline), H & H 12.4/36.8 (baseline)  Date Time HL Rate/Comment 5/4 1450 0.85 SUPRAtherapeutic 5/5 0034 0.59 Therapeutic x 1 5/5 0906 0.58 Therapeutic x 2 5/6       0631    0.64    Therapeutic X 3   Goal of Therapy:  Heparin  level 0.3-0.7 units/ml Monitor platelets by anticoagulation protocol: Yes   Plan:  -Continue heparin  infusion rate at 600 units/hour -Check heparin  level on 5/7 in the AM (therapeutic x 3) -Monitor CBC and signs/symptoms of bleeding  Thank you for involving pharmacy in this patient's care.   Belvie Iribe D 04/26/2024 6:53 AM

## 2024-04-26 NOTE — Plan of Care (Signed)

## 2024-04-26 NOTE — Progress Notes (Signed)
 [facility] CLINIC CARDIOLOGY PROGRESS NOTE   Patient ID: Sandra Delgado MRN: [id number] DOB/AGE: 1933-05-17 89 y.o.  Admit date: 04/23/2024 Referring Physician Dr. Amalia Jung Primary Physician Jimmy Moulding, MD  Primary Cardiologist Dr. Braxton Calico Reason for Consultation NSTEMI  HPI: Sandra Delgado is a 89 y.o. female with a past medical history of hypertension, hyperlipidemia, and COPD who presented to the ED on 04/23/2024 for chest pain. Cardiology was consulted for further evaluation.   Interval History:  -Patient seen and examined this AM.  Confused this a.m. -She is without complaints this a.m.  Eager to go home. -Has completed 48 hours of IV heparin .  BP and heart rate remained stable.  Review of systems complete and found to be negative unless listed above    Vitals:   04/25/24 1641 04/25/24 2042 04/26/24 0442 04/26/24 0811  BP: 101/80 (!) 149/72 (!) 148/84 (!) 148/64  Pulse: (!) 125 72 73 77  Resp: 18 18 18    Temp: (!) 97.5 F (36.4 C) 98.1 F (36.7 C) 98.4 F (36.9 C) 97.9 F (36.6 C)  TempSrc: Oral     SpO2: 97% 99% 100% 98%  Weight:      Height:         Intake/Output Summary (Last 24 hours) at 04/26/2024 0853 Last data filed at 04/25/2024 1502 Gross per 24 hour  Intake 217.55 ml  Output --  Net 217.55 ml     PHYSICAL EXAM General: Well appearing elderly female, well nourished, in no acute distress. HEENT: Normocephalic and atraumatic. Neck: No JVD.  Lungs: Normal respiratory effort on room air. Clear bilaterally to auscultation. No wheezes, crackles, rhonchi.  Heart: HRRR. Normal S1 and S2 without gallops or murmurs. Radial & DP pulses 2+ bilaterally. Abdomen: Non-distended appearing.  Msk: Normal strength and tone for age. Extremities: No clubbing, cyanosis or edema.   Neuro: Alert and oriented X 1. Psych: Mood appropriate, affect congruent.    LABS: Basic Metabolic Panel: Recent Labs    04/25/24 0505 04/26/24 0631  NA 141 136  K 3.9 3.8  CL  109 105  CO2 22 21*  GLUCOSE 100* 116*  BUN 22 23  CREATININE 1.10* 1.09*  CALCIUM  8.8* 8.8*   Liver Function Tests: No results for input(s): "AST", "ALT", "ALKPHOS", "BILITOT", "PROT", "ALBUMIN" in the last 72 hours.  No results for input(s): "LIPASE", "AMYLASE" in the last 72 hours. CBC: Recent Labs    04/23/24 2318 04/25/24 0505 04/26/24 0631  WBC 8.1  8.1 7.2 8.2  NEUTROABS 5.0  --   --   HGB 11.5*  11.4* 12.4 11.6*  HCT 33.6*  33.7* 36.8 34.0*  MCV 96.3  96.0 96.8 95.0  PLT 218  217 199 202   Cardiac Enzymes: Recent Labs    04/24/24 0926 04/24/24 2224 04/26/24 0631  TROPONINIHS 389* 688* 222*   BNP: No results for input(s): "BNP" in the last 72 hours. D-Dimer: Recent Labs    04/23/24 2318  DDIMER <0.27   Hemoglobin A1C: Recent Labs    04/24/24 0749  HGBA1C 4.9   Fasting Lipid Panel: Recent Labs    04/25/24 0505  CHOL 133  HDL 61  LDLCALC 55  TRIG 85  CHOLHDL 2.2   Thyroid Function Tests: No results for input(s): "TSH", "T4TOTAL", "T3FREE", "THYROIDAB" in the last 72 hours.  Invalid input(s): "FREET3" Anemia Panel: No results for input(s): "VITAMINB12", "FOLATE", "FERRITIN", "TIBC", "IRON", "RETICCTPCT" in the last 72 hours.  ECHOCARDIOGRAM COMPLETE BUBBLE STUDY Result Date: 04/24/2024  ECHOCARDIOGRAM REPORT   Patient Name:   Sandra Delgado Date of Exam: 04/24/2024 Medical Rec #:  [id number]      Height:       61.0 in Accession #:    [id number]     Weight:       114.4 lb Date of Birth:  [birth year]      BSA:          1.490 m Patient Age:    89 years       BP:           150/70 mmHg Patient Gender: F              HR:           64 bpm. Exam Location:  [facility] Procedure: 2D Echo, Cardiac Doppler, Color Doppler and Saline Contrast Bubble            Study (Both Spectral and Color Flow Doppler were utilized during            procedure). Indications:     TIA 435.9/ G45.9  History:         Patient has prior history of Echocardiogram examinations.   Sonographer:     Jane Meager RDCS Referring Phys:  [id number] JAN A MANSY Diagnosing Phys: Percival Brace MD  Sonographer Comments: Image acquisition challenging due to respiratory motion. IMPRESSIONS  1. Left ventricular ejection fraction, by estimation, is 60 to 65%. The left ventricle has normal function. The left ventricle has no regional wall motion abnormalities. Left ventricular diastolic parameters are consistent with Grade I diastolic dysfunction (impaired relaxation).  2. Right ventricular systolic function is normal. The right ventricular size is normal.  3. The mitral valve is normal in structure. Mild to moderate mitral valve regurgitation. No evidence of mitral stenosis.  4. Tricuspid valve regurgitation is mild to moderate.  5. The aortic valve is normal in structure. Aortic valve regurgitation is not visualized. No aortic stenosis is present.  6. The inferior vena cava is normal in size with greater than 50% respiratory variability, suggesting right atrial pressure of 3 mmHg.  7. Agitated saline contrast bubble study was negative, with no evidence of any interatrial shunt. FINDINGS  Left Ventricle: Left ventricular ejection fraction, by estimation, is 60 to 65%. The left ventricle has normal function. The left ventricle has no regional wall motion abnormalities. Strain was performed and the global longitudinal strain is indeterminate. The left ventricular internal cavity size was normal in size. There is no left ventricular hypertrophy. Left ventricular diastolic parameters are consistent with Grade I diastolic dysfunction (impaired relaxation). Right Ventricle: The right ventricular size is normal. No increase in right ventricular wall thickness. Right ventricular systolic function is normal. Left Atrium: Left atrial size was normal in size. Right Atrium: Right atrial size was normal in size. Pericardium: There is no evidence of pericardial effusion. Mitral Valve: The mitral valve is normal in  structure. Mild to moderate mitral valve regurgitation. No evidence of mitral valve stenosis. Tricuspid Valve: The tricuspid valve is normal in structure. Tricuspid valve regurgitation is mild to moderate. No evidence of tricuspid stenosis. Aortic Valve: The aortic valve is normal in structure. Aortic valve regurgitation is not visualized. No aortic stenosis is present. Aortic valve peak gradient measures 6.6 mmHg. Pulmonic Valve: The pulmonic valve was normal in structure. Pulmonic valve regurgitation is not visualized. No evidence of pulmonic stenosis. Aorta: The aortic root is normal in size and structure. Venous: The inferior vena cava is normal  in size with greater than 50% respiratory variability, suggesting right atrial pressure of 3 mmHg. IAS/Shunts: No atrial level shunt detected by color flow Doppler. Agitated saline contrast was given intravenously to evaluate for intracardiac shunting. Agitated saline contrast bubble study was negative, with no evidence of any interatrial shunt. Additional Comments: 3D was performed not requiring image post processing on an independent workstation and was indeterminate.  LEFT VENTRICLE PLAX 2D LVIDd:         3.90 cm   Diastology LVIDs:         2.60 cm   LV e' medial:    4.35 cm/s LV PW:         1.00 cm   LV E/e' medial:  19.9 LV IVS:        1.00 cm   LV e' lateral:   6.09 cm/s LVOT diam:     2.00 cm   LV E/e' lateral: 14.2 LV SV:         55 LV SV Index:   37 LVOT Area:     3.14 cm  RIGHT VENTRICLE RV Basal diam:  3.10 cm RV S prime:     12.60 cm/s TAPSE (M-mode): 2.2 cm LEFT ATRIUM             Index        RIGHT ATRIUM           Index LA diam:        2.50 cm 1.68 cm/m   RA Area:     13.00 cm LA Vol (A2C):   30.1 ml 20.21 ml/m  RA Volume:   31.10 ml  20.88 ml/m LA Vol (A4C):   33.4 ml 22.42 ml/m LA Biplane Vol: 31.8 ml 21.35 ml/m  AORTIC VALVE                 PULMONIC VALVE AV Area (Vmax): 1.93 cm     PV Vmax:        0.86 m/s AV Vmax:        128.00 cm/s  PV Peak  grad:   3.0 mmHg AV Peak Grad:   6.6 mmHg     RVOT Peak grad: 2 mmHg LVOT Vmax:      78.60 cm/s LVOT Vmean:     51.900 cm/s LVOT VTI:       0.174 m  AORTA Ao Root diam: 3.30 cm Ao Asc diam:  3.10 cm MITRAL VALVE                TRICUSPID VALVE MV Area (PHT): 3.12 cm     TR Peak grad:   24.8 mmHg MV Decel Time: 243 msec     TR Vmax:        249.00 cm/s MV E velocity: 86.50 cm/s MV A velocity: 105.00 cm/s  SHUNTS MV E/A ratio:  0.82         Systemic VTI:  0.17 m                             Systemic Diam: 2.00 cm Percival Brace MD Electronically signed by Percival Brace MD Signature Date/Time: 04/24/2024/12:18:32 PM    Final      ECHO as above  TELEMETRY reviewed by me 04/26/24: sinus rhythm rate 80s  EKG reviewed by me 04/26/24: Sinus rhythm with left bundle branch block at 67 bpm   DATA reviewed by me 04/26/24: last 24h vitals tele labs imaging I/O, hospitalist  progress note  Principal Problem:   NSTEMI (non-ST elevated myocardial infarction) (HCC) Active Problems:   Dyslipidemia   GERD without esophagitis   Peripheral neuropathy   Essential hypertension   Vertigo    ASSESSMENT AND PLAN: Sandra Delgado is a 89 y.o. female with a past medical history of hypertension, hyperlipidemia, and COPD who presented to the ED on 04/23/2024 for chest pain. Cardiology was consulted for further evaluation.   # NSTEMI # Coronary artery disease # Chronic kidney disease stage III Patient with hx of CAD with last cath in 2018 presented with complaints of chest pain. Troponins 27 > 113 > 395 > 389 > 688. EKG with NSR and LBBB which is chronic.  -Per patient after discussion she prefers to pursue medical management of her NSTEMI. States she has had multiple caths in the past and would not like to pursue this at this time.  -Heparin  discontinued this morning after completion of over 48 hours. -Continue aspirin  81 mg daily. Continue crestor 20 mg daily.  -Continue imdur 30 mg daily for antianginal  management. Uptitrate as able.   Ok for discharge today from a cardiac perspective. Will arrange for follow up in clinic with Dr. Custovic in 1-2 weeks.    This patient's case was discussed and created with Dr. Bob Burn and he is in agreement.  Signed:  Hamp Levine, PA-C  04/26/2024, 8:53 AM [facility] Cardiology

## 2024-04-26 NOTE — TOC Progression Note (Signed)
 Transition of Care [facility]) - Progression Note    Patient Details  Name: Sandra Delgado MRN: [id number] Date of Birth: July 22, 1933  Transition of [facility] CM/SW Contact  Baird Bombard, RN Phone Number: 04/26/2024, 4:05 PM  Clinical Narrative:    Spoke with patient and her son at the bedside regarding HH. He is agreeable and would like HH via [facility]  RNCM contacted Louanna Rouse from [facility] regarding HH. Referral accepted.   Patient's son advised he would be contacted within 48 hours of discharge.   Patient's son will transport her home.  TOC signing off.            Expected Discharge Plan and Services         Expected Discharge Date: 04/26/24                                     Social Determinants of Health (SDOH) Interventions SDOH Screenings   Food Insecurity: No Food Insecurity (04/20/2024)   Received from [facility]  Housing: Low Risk  (04/20/2024)   Received from [facility]  Transportation Needs: No Transportation Needs (04/20/2024)   Received from [facility]  Utilities: Not At Risk (04/20/2024)   Received from [facility]  Financial Resource Strain: Low Risk  (04/20/2024)   Received from [facility]  Tobacco Use: Low Risk  (04/23/2024)    Readmission Risk Interventions     No data to display

## 2024-04-26 NOTE — Discharge Summary (Signed)
 Physician Discharge Summary   Patient: Sandra Delgado MRN: [id number]  DOB: Sep 21, 1933   Admit:     Date of Admission: 04/23/2024 Admitted from: home   Discharge: Date of discharge: 04/26/24 Disposition: Home health Condition at discharge: good  CODE STATUS: DNR     Discharge Physician: Melodi Sprung, DO [facility]     PCP: Jimmy Moulding, MD  Recommendations for Outpatient Follow-up:  Follow up with PCP Jimmy Moulding, MD in 1-2 weeks    Discharge Instructions     Diet - low sodium heart healthy   Complete by: As directed    Increase activity slowly   Complete by: As directed          Discharge Diagnoses: Principal Problem:   NSTEMI (non-ST elevated myocardial infarction) [facility]) Active Problems:   Vertigo   Dyslipidemia   Essential hypertension   GERD without esophagitis   Peripheral neuropathy       Hospital course / significant events:   HPI: Sandra Delgado is a 89 y.o. female with medical history significant for COPD, dyslipidemia, hypertension, IBS and stage III CKD, who presented to the emergency room 05/03 with acute onset of chest heaviness and burning graded 8/10 in severity,  dizziness and lightheadedness with vertigo that started around 9 PM 05/03  05/03: to ED. Troponin 27 --> 113, EKG NSR w/ LBBB, CT head neg, teleneurology consult. Started on IV heparin , aspirin  and 1 L bolus of IV normal saline.  05/04: admitted to hospitalist service for NSTEMI --> troponin trend peak 390s, cardiology recs cath tomorrow but patient at this time says she wants to decline this, echo EF 60-65% and grade 1 diast df, continue heparin .  05/05: declined cath, continue heparin  x48h per cardiology  05/06: off heparin  today, ok for dc per cardiology     Consultants:  Neurology (Telestroke in ED) Cardiology   Procedures/Surgeries: none      ASSESSMENT & PLAN:   NSTEMI (non-ST elevated myocardial infarction) (HCC) Continue  home aspirin  and Plavix   p.r.n. sublingual nitroglycerin  beta-blocker, ARB rosuvastatin Pt has declined cardiac cath  Dizziness Ddx includes TIA vs vertigo vs more likely presyncope from NSTEMI  Hx migraine Stroke not suspected and neg MRI, no LVO on CTA H/N Neuro Checks can dc DAPT Aspirin  81 mg daily and Clopidogrel  75 mg daily x3 weeks  Dyslipidemia statin   Essential hypertension Resume home meds    Peripheral neuropathy Neurontin.   GERD without esophagitis PPI   No concerns based on BMI: Body mass index is 21.62 kg/m.Aaron Aas Significantly low or high BMI is associated with higher medical risk.  Underweight - under 18  overweight - 25 to 29 obese - 30 or more Class 1 obesity: BMI of 30.0 to 34 Class 2 obesity: BMI of 35.0 to 39 Class 3 obesity: BMI of 40.0 to 49 Super Morbid Obesity: BMI 50-59 Super-super Morbid Obesity: BMI 60+ Healthy nutrition and physical activity advised as adjunct to other disease management and risk reduction treatments             Discharge Instructions  Allergies as of 04/26/2024       Reactions   Ace Inhibitors Other (See Comments)   Reaction: unknown   Amoxicillin Diarrhea   Has patient had a PCN reaction causing immediate rash, facial/tongue/throat swelling, SOB or lightheadedness with hypotension: No Has patient had a PCN reaction causing severe rash involving mucus membranes or skin necrosis: No Has patient had a PCN reaction  that required hospitalization No Has patient had a PCN reaction occurring within the last 10 years: Yes If all of the above answers are "NO", then may proceed with Cephalosporin use.   Atorvastatin  Other (See Comments)   Reaction: muscle pain   Ezetimibe Other (See Comments)   Reaction: fatigue   Niacin And Related Other (See Comments)   Reaction: flushing        Medication List     STOP taking these medications    atorvastatin  40 MG tablet Commonly known as: LIPITOR    omeprazole 40 MG  capsule Commonly known as: PRILOSEC   pravastatin  80 MG tablet Commonly known as: PRAVACHOL        TAKE these medications    aspirin  EC 81 MG tablet Take 1 tablet (81 mg total) by mouth daily.   carvedilol  3.125 MG tablet Commonly known as: COREG  Take 3.125 mg by mouth 2 (two) times daily with a meal.   clopidogrel  75 MG tablet Commonly known as: PLAVIX  Take 75 mg by mouth daily.   gabapentin 300 MG capsule Commonly known as: NEURONTIN Take 300 mg by mouth at bedtime.   isosorbide mononitrate 30 MG 24 hr tablet Commonly known as: IMDUR Take 1 tablet (30 mg total) by mouth daily. Start taking on: Apr 27, 2024   losartan  100 MG tablet Commonly known as: COZAAR  Take 100 mg by mouth daily.   mirtazapine 7.5 MG tablet Commonly known as: REMERON Take 7.5 mg by mouth at bedtime.   nitroGLYCERIN  0.4 MG SL tablet Commonly known as: NITROSTAT  Place 1 tablet (0.4 mg total) under the tongue every 5 (five) minutes as needed for chest pain.   pantoprazole  40 MG tablet Commonly known as: PROTONIX  Take 1 tablet by mouth daily.   rosuvastatin 20 MG tablet Commonly known as: CRESTOR Take 1 tablet (20 mg total) by mouth daily. Start taking on: Apr 27, 2024         Follow-up Information     Custovic, Lanell Pinta, DO. Go in 1 week(s).   Specialty: Cardiology Contact information: [address] [place] [address] [phone]                 Allergies  Allergen Reactions   Ace Inhibitors Other (See Comments)    Reaction: unknown   Amoxicillin Diarrhea    Has patient had a PCN reaction causing immediate rash, facial/tongue/throat swelling, SOB or lightheadedness with hypotension: No Has patient had a PCN reaction causing severe rash involving mucus membranes or skin necrosis: No Has patient had a PCN reaction that required hospitalization No Has patient had a PCN reaction occurring within the last 10 years: Yes If all of the above answers are "NO", then  may proceed with Cephalosporin use.    Atorvastatin  Other (See Comments)    Reaction: muscle pain   Ezetimibe Other (See Comments)    Reaction: fatigue   Niacin And Related Other (See Comments)    Reaction: flushing     Subjective: pt feeling well this morning, wants to go home. Son reports more confused here but she has baseline reduced memory/cognition. No other complaints, no chest pain, no SOB, no weakness    Discharge Exam: BP 129/61 (BP Location: Left Arm)   Pulse (!) 59   Temp 98.3 F (36.8 C)   Resp 18   Ht 5\' 1"  (1.549 m)   Wt 51.9 kg   SpO2 98%   BMI 21.62 kg/m  General: Pt is alert, awake, not in acute distress  Cardiovascular: RRR, S1/S2 +, no rubs, no gallops Respiratory: CTA bilaterally, no wheezing, no rhonchi Abdominal: Soft, NT, ND, bowel sounds + Extremities: no edema, no cyanosis     The results of significant diagnostics from this hospitalization (including imaging, microbiology, ancillary and laboratory) are listed below for reference.     Microbiology: No results found for this or any previous visit (from the past 240 hours).   Labs: BNP (last 3 results) No results for input(s): "BNP" in the last 8760 hours. Basic Metabolic Panel: Recent Labs  Lab 04/23/24 0131 04/23/24 2318 04/25/24 0505 04/26/24 0631  NA 140 139 141 136  K 4.8 4.2 3.9 3.8  CL 111 109 109 105  CO2 21* 22 22 21*  GLUCOSE 103* 111* 100* 116*  BUN 31* 33* 22 23  CREATININE 1.22* 1.27* 1.10* 1.09*  CALCIUM  8.6* 8.7* 8.8* 8.8*   Liver Function Tests: Recent Labs  Lab 04/23/24 0131  AST 27  ALT 9  ALKPHOS 35*  BILITOT 1.5*  PROT 6.2*  ALBUMIN 3.4*   No results for input(s): "LIPASE", "AMYLASE" in the last 168 hours. No results for input(s): "AMMONIA" in the last 168 hours. CBC: Recent Labs  Lab 04/23/24 2318 04/25/24 0505 04/26/24 0631  WBC 8.1  8.1 7.2 8.2  NEUTROABS 5.0  --   --   HGB 11.5*  11.4* 12.4 11.6*  HCT 33.6*  33.7* 36.8 34.0*  MCV 96.3   96.0 96.8 95.0  PLT 218  217 199 202   Cardiac Enzymes: No results for input(s): "CKTOTAL", "CKMB", "CKMBINDEX", "TROPONINI" in the last 168 hours. BNP: Invalid input(s): "POCBNP" CBG: No results for input(s): "GLUCAP" in the last 168 hours. D-Dimer Recent Labs    04/23/24 2318  DDIMER <0.27   Hgb A1c Recent Labs    04/24/24 0749  HGBA1C 4.9   Lipid Profile Recent Labs    04/25/24 0505  CHOL 133  HDL 61  LDLCALC 55  TRIG 85  CHOLHDL 2.2   Thyroid function studies No results for input(s): "TSH", "T4TOTAL", "T3FREE", "THYROIDAB" in the last 72 hours.  Invalid input(s): "FREET3" Anemia work up No results for input(s): "VITAMINB12", "FOLATE", "FERRITIN", "TIBC", "IRON", "RETICCTPCT" in the last 72 hours. Urinalysis    Component Value Date/Time   COLORURINE STRAW (A) 04/24/2024 0019   APPEARANCEUR CLEAR (A) 04/24/2024 0019   APPEARANCEUR Clear 11/27/2014 1526   LABSPEC 1.009 04/24/2024 0019   LABSPEC 1.010 11/27/2014 1526   PHURINE 5.0 04/24/2024 0019   GLUCOSEU NEGATIVE 04/24/2024 0019   GLUCOSEU Negative 11/27/2014 1526   HGBUR SMALL (A) 04/24/2024 0019   BILIRUBINUR NEGATIVE 04/24/2024 0019   BILIRUBINUR Negative 11/27/2014 1526   KETONESUR NEGATIVE 04/24/2024 0019   PROTEINUR NEGATIVE 04/24/2024 0019   NITRITE NEGATIVE 04/24/2024 0019   LEUKOCYTESUR MODERATE (A) 04/24/2024 0019   LEUKOCYTESUR 1+ 11/27/2014 1526   Sepsis Labs Recent Labs  Lab 04/23/24 2318 04/25/24 0505 04/26/24 0631  WBC 8.1  8.1 7.2 8.2   Microbiology No results found for this or any previous visit (from the past 240 hours). Imaging ECHOCARDIOGRAM COMPLETE BUBBLE STUDY Result Date: 04/24/2024    ECHOCARDIOGRAM REPORT   Patient Name:   AKIAH HARTIGAN Loveridge Date of Exam: 04/24/2024 Medical Rec #:  [id number]      Height:       61.0 in Accession #:    [id number]     Weight:       114.4 lb Date of Birth:  Dec 12, 1933      BSA:  1.490 m Patient Age:    89 years       BP:           150/70  mmHg Patient Gender: F              HR:           64 bpm. Exam Location:  [facility] Procedure: 2D Echo, Cardiac Doppler, Color Doppler and Saline Contrast Bubble            Study (Both Spectral and Color Flow Doppler were utilized during            procedure). Indications:     TIA 435.9/ G45.9  History:         Patient has prior history of Echocardiogram examinations.  Sonographer:     Jane Meager RDCS Referring Phys:  [id number] JAN A MANSY Diagnosing Phys: Percival Brace MD  Sonographer Comments: Image acquisition challenging due to respiratory motion. IMPRESSIONS  1. Left ventricular ejection fraction, by estimation, is 60 to 65%. The left ventricle has normal function. The left ventricle has no regional wall motion abnormalities. Left ventricular diastolic parameters are consistent with Grade I diastolic dysfunction (impaired relaxation).  2. Right ventricular systolic function is normal. The right ventricular size is normal.  3. The mitral valve is normal in structure. Mild to moderate mitral valve regurgitation. No evidence of mitral stenosis.  4. Tricuspid valve regurgitation is mild to moderate.  5. The aortic valve is normal in structure. Aortic valve regurgitation is not visualized. No aortic stenosis is present.  6. The inferior vena cava is normal in size with greater than 50% respiratory variability, suggesting right atrial pressure of 3 mmHg.  7. Agitated saline contrast bubble study was negative, with no evidence of any interatrial shunt. FINDINGS  Left Ventricle: Left ventricular ejection fraction, by estimation, is 60 to 65%. The left ventricle has normal function. The left ventricle has no regional wall motion abnormalities. Strain was performed and the global longitudinal strain is indeterminate. The left ventricular internal cavity size was normal in size. There is no left ventricular hypertrophy. Left ventricular diastolic parameters are consistent with Grade I diastolic dysfunction (impaired  relaxation). Right Ventricle: The right ventricular size is normal. No increase in right ventricular wall thickness. Right ventricular systolic function is normal. Left Atrium: Left atrial size was normal in size. Right Atrium: Right atrial size was normal in size. Pericardium: There is no evidence of pericardial effusion. Mitral Valve: The mitral valve is normal in structure. Mild to moderate mitral valve regurgitation. No evidence of mitral valve stenosis. Tricuspid Valve: The tricuspid valve is normal in structure. Tricuspid valve regurgitation is mild to moderate. No evidence of tricuspid stenosis. Aortic Valve: The aortic valve is normal in structure. Aortic valve regurgitation is not visualized. No aortic stenosis is present. Aortic valve peak gradient measures 6.6 mmHg. Pulmonic Valve: The pulmonic valve was normal in structure. Pulmonic valve regurgitation is not visualized. No evidence of pulmonic stenosis. Aorta: The aortic root is normal in size and structure. Venous: The inferior vena cava is normal in size with greater than 50% respiratory variability, suggesting right atrial pressure of 3 mmHg. IAS/Shunts: No atrial level shunt detected by color flow Doppler. Agitated saline contrast was given intravenously to evaluate for intracardiac shunting. Agitated saline contrast bubble study was negative, with no evidence of any interatrial shunt. Additional Comments: 3D was performed not requiring image post processing on an independent workstation and was indeterminate.  LEFT VENTRICLE PLAX 2D LVIDd:  3.90 cm   Diastology LVIDs:         2.60 cm   LV e' medial:    4.35 cm/s LV PW:         1.00 cm   LV E/e' medial:  19.9 LV IVS:        1.00 cm   LV e' lateral:   6.09 cm/s LVOT diam:     2.00 cm   LV E/e' lateral: 14.2 LV SV:         55 LV SV Index:   37 LVOT Area:     3.14 cm  RIGHT VENTRICLE RV Basal diam:  3.10 cm RV S prime:     12.60 cm/s TAPSE (M-mode): 2.2 cm LEFT ATRIUM             Index         RIGHT ATRIUM           Index LA diam:        2.50 cm 1.68 cm/m   RA Area:     13.00 cm LA Vol (A2C):   30.1 ml 20.21 ml/m  RA Volume:   31.10 ml  20.88 ml/m LA Vol (A4C):   33.4 ml 22.42 ml/m LA Biplane Vol: 31.8 ml 21.35 ml/m  AORTIC VALVE                 PULMONIC VALVE AV Area (Vmax): 1.93 cm     PV Vmax:        0.86 m/s AV Vmax:        128.00 cm/s  PV Peak grad:   3.0 mmHg AV Peak Grad:   6.6 mmHg     RVOT Peak grad: 2 mmHg LVOT Vmax:      78.60 cm/s LVOT Vmean:     51.900 cm/s LVOT VTI:       0.174 m  AORTA Ao Root diam: 3.30 cm Ao Asc diam:  3.10 cm MITRAL VALVE                TRICUSPID VALVE MV Area (PHT): 3.12 cm     TR Peak grad:   24.8 mmHg MV Decel Time: 243 msec     TR Vmax:        249.00 cm/s MV E velocity: 86.50 cm/s MV A velocity: 105.00 cm/s  SHUNTS MV E/A ratio:  0.82         Systemic VTI:  0.17 m                             Systemic Diam: 2.00 cm Percival Brace MD Electronically signed by Percival Brace MD Signature Date/Time: 04/24/2024/12:18:32 PM    Final    MR BRAIN WO CONTRAST Result Date: 04/24/2024 CLINICAL DATA:  Transient ischemic attack (TIA). EXAM: MRI HEAD WITHOUT CONTRAST TECHNIQUE: Multiplanar, multiecho pulse sequences of the brain and surrounding structures were obtained without intravenous contrast. COMPARISON:  CT head Apr 23, 2024. FINDINGS: Brain: No acute infarction, hemorrhage, hydrocephalus, extra-axial collection or mass lesion. Mild for age scattered T2/FLAIR hyperintensities in the white matter, compatible with chronic microvascular ischemic change. Vascular: Major arterial flow voids are maintained at the skull base. Skull and upper cervical spine: Normal marrow signal. Sinuses/Orbits: Clear sinuses.  No acute orbital findings. Other: No mastoid effusions. IMPRESSION: No acute abnormality. Electronically Signed   By: Stevenson Elbe M.D.   On: 04/24/2024 03:56   CT Angio Head Neck W WO  CM Result Date: 04/24/2024 CLINICAL DATA:  Transient ischemic  attack (TIA) EXAM: CT ANGIOGRAPHY HEAD AND NECK WITH AND WITHOUT CONTRAST TECHNIQUE: Multidetector CT imaging of the head and neck was performed using the standard protocol during bolus administration of intravenous contrast. Multiplanar CT image reconstructions and MIPs were obtained to evaluate the vascular anatomy. Carotid stenosis measurements (when applicable) are obtained utilizing NASCET criteria, using the distal internal carotid diameter as the denominator. RADIATION DOSE REDUCTION: This exam was performed according to the departmental dose-optimization program which includes automated exposure control, adjustment of the mA and/or kV according to patient size and/or use of iterative reconstruction technique. CONTRAST:  75mL OMNIPAQUE  IOHEXOL  350 MG/ML SOLN COMPARISON:  None Available. FINDINGS: CTA NECK FINDINGS Aortic arch: Aortic atherosclerosis. Great vessel origins are patent without significant stenosis. Right carotid system: No evidence of dissection, stenosis (50% or greater), or occlusion. Left carotid system: No evidence of dissection, stenosis (50% or greater), or occlusion. Vertebral arteries: Codominant. No evidence of dissection, stenosis (50% or greater), or occlusion. Skeleton: No evidence of acute abnormality on limited assessment. Limbus vertebrae at multiple levels. Other neck: No acute abnormality on limited assessment. Upper chest: No acute abnormality on limited assessment. Review of the MIP images confirms the above findings CTA HEAD FINDINGS Anterior circulation: Bilateral intracranial ICAs, MCAs, and ACAs are patent without proximal hemodynamically significant stenosis. Posterior circulation: Bilateral intradural vertebral arteries, basilar artery, and bilateral posterior cerebral arteries are patent without proximal hemodynamically significant stenosis. Venous sinuses: As permitted by contrast timing, patent. Review of the MIP images confirms the above findings IMPRESSION: No large  vessel occlusion or proximal hemodynamically significant stenosis. Electronically Signed   By: Stevenson Elbe M.D.   On: 04/24/2024 03:42      Time coordinating discharge: over 30 minutes  SIGNED:  Halima Fogal DO [facility]

## 2024-07-11 NOTE — Progress Notes (Addendum)
 Brief cardiology note not for billing purposes  Patient not referred to cardiac rehab on discharge from this hospitalization due to baseline poor functional status and mild dementia. Patient and family express they would not like to pursue in-person cardiac rehab. Plan for in home PT on discharge.   Danita Bloch, PA-C 04/26/2024
